# Patient Record
Sex: Female | Born: 1970 | Race: Black or African American | Hispanic: No | Marital: Married | Smoking: Never smoker
Health system: Southern US, Community
[De-identification: ages and names within clinical notes are randomized; demographics above are authoritative.]

## PROBLEM LIST (undated history)

## (undated) DIAGNOSIS — Z8719 Personal history of other diseases of the digestive system: Secondary | ICD-10-CM

## (undated) DIAGNOSIS — N840 Polyp of corpus uteri: Secondary | ICD-10-CM

## (undated) DIAGNOSIS — R9389 Abnormal findings on diagnostic imaging of other specified body structures: Secondary | ICD-10-CM

## (undated) DIAGNOSIS — I10 Essential (primary) hypertension: Secondary | ICD-10-CM

## (undated) DIAGNOSIS — N950 Postmenopausal bleeding: Secondary | ICD-10-CM

## (undated) DIAGNOSIS — Z8759 Personal history of other complications of pregnancy, childbirth and the puerperium: Secondary | ICD-10-CM

## (undated) HISTORY — PX: ECTOPIC PREGNANCY SURGERY: SHX613

---
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
## 2005-07-23 ENCOUNTER — Ambulatory Visit: Payer: Self-pay | Admitting: Family Medicine

## 2005-08-16 ENCOUNTER — Ambulatory Visit: Payer: Self-pay | Admitting: Family Medicine

## 2009-02-16 ENCOUNTER — Emergency Department (HOSPITAL_COMMUNITY): Admission: EM | Admit: 2009-02-16 | Discharge: 2009-02-16 | Payer: Self-pay | Admitting: Emergency Medicine

## 2009-06-13 ENCOUNTER — Emergency Department (HOSPITAL_COMMUNITY): Admission: EM | Admit: 2009-06-13 | Discharge: 2009-06-13 | Payer: Self-pay | Admitting: Emergency Medicine

## 2010-12-11 ENCOUNTER — Emergency Department (HOSPITAL_COMMUNITY)
Admission: EM | Admit: 2010-12-11 | Discharge: 2010-12-11 | Payer: Self-pay | Source: Home / Self Care | Admitting: Emergency Medicine

## 2011-02-26 ENCOUNTER — Inpatient Hospital Stay (INDEPENDENT_AMBULATORY_CARE_PROVIDER_SITE_OTHER)
Admission: RE | Admit: 2011-02-26 | Discharge: 2011-02-26 | Disposition: A | Discharge status: Home / Self Care | Payer: BC Managed Care – PPO | Source: Ambulatory Visit | Attending: Family Medicine | Admitting: Family Medicine

## 2011-02-26 ENCOUNTER — Ambulatory Visit (INDEPENDENT_AMBULATORY_CARE_PROVIDER_SITE_OTHER): Payer: BC Managed Care – PPO

## 2011-02-26 DIAGNOSIS — N760 Acute vaginitis: Secondary | ICD-10-CM

## 2011-02-26 DIAGNOSIS — K5900 Constipation, unspecified: Secondary | ICD-10-CM

## 2011-02-26 LAB — WET PREP, GENITAL: Yeast Wet Prep HPF POC: NONE SEEN

## 2011-02-26 LAB — POCT URINALYSIS DIP (DEVICE)
Glucose, UA: NEGATIVE mg/dL
Ketones, ur: NEGATIVE mg/dL
Protein, ur: NEGATIVE mg/dL
Specific Gravity, Urine: 1.02 (ref 1.005–1.030)

## 2011-02-26 LAB — POCT PREGNANCY, URINE: Preg Test, Ur: NEGATIVE

## 2011-04-20 ENCOUNTER — Emergency Department (HOSPITAL_COMMUNITY): Payer: BC Managed Care – PPO

## 2011-04-20 ENCOUNTER — Emergency Department (HOSPITAL_COMMUNITY)
Admission: EM | Admit: 2011-04-20 | Discharge: 2011-04-20 | Disposition: A | Discharge status: Home / Self Care | Payer: BC Managed Care – PPO | Attending: Emergency Medicine | Admitting: Emergency Medicine

## 2011-04-20 DIAGNOSIS — R209 Unspecified disturbances of skin sensation: Secondary | ICD-10-CM | POA: Insufficient documentation

## 2011-04-20 DIAGNOSIS — M545 Low back pain, unspecified: Secondary | ICD-10-CM | POA: Insufficient documentation

## 2011-04-20 DIAGNOSIS — X58XXXA Exposure to other specified factors, initial encounter: Secondary | ICD-10-CM | POA: Insufficient documentation

## 2011-04-20 DIAGNOSIS — T148XXA Other injury of unspecified body region, initial encounter: Secondary | ICD-10-CM | POA: Insufficient documentation

## 2011-04-20 DIAGNOSIS — M5430 Sciatica, unspecified side: Secondary | ICD-10-CM | POA: Insufficient documentation

## 2011-04-20 DIAGNOSIS — M25559 Pain in unspecified hip: Secondary | ICD-10-CM | POA: Insufficient documentation

## 2011-04-20 LAB — URINALYSIS, ROUTINE W REFLEX MICROSCOPIC
Bilirubin Urine: NEGATIVE
Glucose, UA: NEGATIVE mg/dL
Hgb urine dipstick: NEGATIVE
Ketones, ur: NEGATIVE mg/dL
pH: 7 (ref 5.0–8.0)

## 2011-10-25 ENCOUNTER — Emergency Department (HOSPITAL_COMMUNITY)
Admission: EM | Admit: 2011-10-25 | Discharge: 2011-10-25 | Disposition: A | Discharge status: Home / Self Care | Payer: BC Managed Care – PPO | Attending: Emergency Medicine | Admitting: Emergency Medicine

## 2011-10-25 ENCOUNTER — Encounter: Payer: Self-pay | Admitting: *Deleted

## 2011-10-25 DIAGNOSIS — B9789 Other viral agents as the cause of diseases classified elsewhere: Secondary | ICD-10-CM | POA: Insufficient documentation

## 2011-10-25 DIAGNOSIS — R51 Headache: Secondary | ICD-10-CM | POA: Insufficient documentation

## 2011-10-25 DIAGNOSIS — R071 Chest pain on breathing: Secondary | ICD-10-CM | POA: Insufficient documentation

## 2011-10-25 DIAGNOSIS — B349 Viral infection, unspecified: Secondary | ICD-10-CM

## 2011-10-25 DIAGNOSIS — M549 Dorsalgia, unspecified: Secondary | ICD-10-CM | POA: Insufficient documentation

## 2011-10-25 DIAGNOSIS — R5381 Other malaise: Secondary | ICD-10-CM | POA: Insufficient documentation

## 2011-10-25 DIAGNOSIS — R509 Fever, unspecified: Secondary | ICD-10-CM | POA: Insufficient documentation

## 2011-10-25 DIAGNOSIS — R05 Cough: Secondary | ICD-10-CM | POA: Insufficient documentation

## 2011-10-25 DIAGNOSIS — R5383 Other fatigue: Secondary | ICD-10-CM | POA: Insufficient documentation

## 2011-10-25 DIAGNOSIS — R059 Cough, unspecified: Secondary | ICD-10-CM | POA: Insufficient documentation

## 2011-10-25 DIAGNOSIS — R070 Pain in throat: Secondary | ICD-10-CM | POA: Insufficient documentation

## 2011-10-25 MED ORDER — ACETAMINOPHEN 325 MG PO TABS
650.0000 mg | ORAL_TABLET | Freq: Once | ORAL | Status: AC
  Administered 2011-10-25: 650 mg via ORAL
  Filled 2011-10-25: qty 2

## 2011-10-25 NOTE — ED Provider Notes (Signed)
History     CSN: [id number] Arrival date & time: 10/25/2011 10:26 AM   First MD Initiated Contact with Patient 10/25/11 1154      Chief Complaint  Patient presents with  . URI     Patient is a 40 y.o. female presenting with URI. The history is provided by the patient.  URI The primary symptoms include fever, fatigue, headaches, sore throat and cough. Primary symptoms do not include abdominal pain or vomiting. The current episode started yesterday. This is a new problem. The problem has been gradually worsening.  no focal weakness No abd pain She is ambulatory Chest wall and back hurt worse with cough No recent travel She is otherwise healthy   History reviewed. No pertinent past medical history.  History reviewed. No pertinent past surgical history.  History reviewed. No pertinent family history.  History  Substance Use Topics  . Smoking status: Never Smoker   . Smokeless tobacco: Not on file  . Alcohol Use: No    OB History    Grav Para Term Preterm Abortions TAB SAB Ect Mult Living                  Review of Systems  Constitutional: Positive for fever and fatigue.  HENT: Positive for sore throat.   Respiratory: Positive for cough.   Gastrointestinal: Negative for vomiting and abdominal pain.  Neurological: Positive for headaches.    Allergies  Review of patient's allergies indicates no known allergies.  Home Medications  No current outpatient prescriptions on file.  BP 138/72  Pulse 117  Temp(Src) 99.6 F (37.6 C) (Oral)  Resp 20  SpO2 97%  LMP 10/06/2011  Physical Exam CONSTITUTIONAL: Well developed/well nourished HEAD AND FACE: Normocephalic/atraumatic EYES: EOMI/PERRL ENMT: Mucous membranes moist, uvula midline, +exudates, +erythema NECK: supple no meningeal signs SPINE:entire spine nontender Chest - tender to palpation, no crepitus CV: S1/S2 noted, no murmurs/rubs/gallops noted LUNGS: Lungs are clear to auscultation bilaterally, no  apparent distress ABDOMEN: soft, nontender, no rebound or guarding NEURO: Pt is awake/alert, moves all extremitiesx4 Gait normal She is in no distress EXTREMITIES: pulses normal, full ROM SKIN: warm, color normal PSYCH: no abnormalities of mood noted    ED Course  Procedures    Labs Reviewed  RAPID STREP SCREEN     MDM  Nursing notes reviewed and considered in documentation All labs/vitals reviewed and considered  Pt is well appearing, likely URI Lung sounds clear        Joya Gaskins, MD 10/25/11 1236

## 2011-10-25 NOTE — ED Notes (Signed)
Reports having cold with productive cough, pain when coughing. Also having lower back pain. Denies urinary symptoms.

## 2013-07-05 ENCOUNTER — Emergency Department (HOSPITAL_COMMUNITY)
Admission: EM | Admit: 2013-07-05 | Discharge: 2013-07-06 | Disposition: A | Discharge status: Home / Self Care | Payer: BC Managed Care – PPO | Attending: Emergency Medicine | Admitting: Emergency Medicine

## 2013-07-05 ENCOUNTER — Encounter (HOSPITAL_COMMUNITY): Payer: Self-pay | Admitting: Cardiology

## 2013-07-05 DIAGNOSIS — Z791 Long term (current) use of non-steroidal anti-inflammatories (NSAID): Secondary | ICD-10-CM | POA: Insufficient documentation

## 2013-07-05 DIAGNOSIS — N390 Urinary tract infection, site not specified: Secondary | ICD-10-CM

## 2013-07-05 DIAGNOSIS — R109 Unspecified abdominal pain: Secondary | ICD-10-CM | POA: Insufficient documentation

## 2013-07-05 LAB — COMPREHENSIVE METABOLIC PANEL
Alkaline Phosphatase: 111 U/L (ref 39–117)
BUN: 12 mg/dL (ref 6–23)
GFR calc Af Amer: >90 mL/min (ref >90)
Glucose, Bld: 101 mg/dL — ABNORMAL HIGH (ref 70–99)
Potassium: 3.6 mEq/L (ref 3.5–5.1)
Total Protein: 7.7 g/dL (ref 6.0–8.3)

## 2013-07-05 LAB — CBC WITH DIFFERENTIAL/PLATELET
Eosinophils Absolute: 0.2 10*3/uL (ref 0.0–0.7)
Eosinophils Relative: 2 % (ref 0–5)
HCT: 37.1 % (ref 36.0–46.0)
Hemoglobin: 12.7 g/dL (ref 12.0–15.0)
Lymphs Abs: 3.1 10*3/uL (ref 0.7–4.0)
MCH: 28.9 pg (ref 26.0–34.0)
MCV: 84.3 fL (ref 78.0–100.0)
Monocytes Relative: 9 % (ref 3–12)
Neutrophils Relative %: 45 % (ref 43–77)
RBC: 4.4 MIL/uL (ref 3.87–5.11)

## 2013-07-05 NOTE — ED Notes (Signed)
Pt reports that she started having lower abd pain that started this morning. States that she missed her period last month but is currently on it now. Denies any urinary symptoms. No n/v.

## 2013-07-06 ENCOUNTER — Emergency Department (HOSPITAL_COMMUNITY): Payer: BC Managed Care – PPO

## 2013-07-06 ENCOUNTER — Encounter (HOSPITAL_COMMUNITY): Payer: Self-pay | Admitting: Radiology

## 2013-07-06 LAB — URINALYSIS, ROUTINE W REFLEX MICROSCOPIC
Ketones, ur: 15 mg/dL — AB
Nitrite: POSITIVE — AB
Protein, ur: 100 mg/dL — AB
Urobilinogen, UA: 1 mg/dL (ref 0.0–1.0)
pH: 5 (ref 5.0–8.0)

## 2013-07-06 LAB — URINE MICROSCOPIC-ADD ON

## 2013-07-06 MED ORDER — NITROFURANTOIN MONOHYD MACRO 100 MG PO CAPS: 100.0000 mg | ORAL_CAPSULE | Freq: Two times a day (BID) | ORAL | Status: DC

## 2013-07-06 MED ORDER — PHENAZOPYRIDINE HCL 200 MG PO TABS: 200.0000 mg | ORAL_TABLET | Freq: Three times a day (TID) | ORAL | Status: DC

## 2013-07-06 MED ORDER — PHENAZOPYRIDINE HCL 100 MG PO TABS
200.0000 mg | ORAL_TABLET | Freq: Once | ORAL | Status: AC
  Administered 2013-07-06: 200 mg via ORAL
  Filled 2013-07-06: qty 2

## 2013-07-06 MED ORDER — IBUPROFEN 600 MG PO TABS: 600.0000 mg | ORAL_TABLET | Freq: Four times a day (QID) | ORAL | Status: DC | PRN

## 2013-07-06 MED ORDER — SODIUM CHLORIDE 0.9 % IV BOLUS (SEPSIS)
1000.0000 mL | Freq: Once | INTRAVENOUS | Status: AC
  Administered 2013-07-06: 1000 mL via INTRAVENOUS

## 2013-07-06 MED ORDER — DEXTROSE 5 % IV SOLN
1.0000 g | Freq: Once | INTRAVENOUS | Status: AC
  Administered 2013-07-06: 1 g via INTRAVENOUS
  Filled 2013-07-06: qty 10

## 2013-07-06 MED ORDER — HYDROMORPHONE HCL PF 1 MG/ML IJ SOLN
1.0000 mg | INTRAMUSCULAR | Status: AC
Start: 2013-07-06 — End: 2013-07-06
  Administered 2013-07-06: 1 mg via INTRAVENOUS
  Filled 2013-07-06: qty 1

## 2013-07-06 MED ORDER — NITROFURANTOIN MONOHYD MACRO 100 MG PO CAPS
100.0000 mg | ORAL_CAPSULE | Freq: Once | ORAL | Status: AC
  Administered 2013-07-06: 100 mg via ORAL
  Filled 2013-07-06: qty 1

## 2013-07-06 MED ORDER — ONDANSETRON HCL 4 MG/2ML IJ SOLN
4.0000 mg | Freq: Once | INTRAMUSCULAR | Status: AC
  Administered 2013-07-06: 4 mg via INTRAVENOUS
  Filled 2013-07-06: qty 2

## 2013-07-06 NOTE — ED Notes (Signed)
Patient in route to Radiology at this time.

## 2013-07-06 NOTE — ED Provider Notes (Signed)
CSN: [id number]     Arrival date & time 07/05/13  1813 History     First MD Initiated Contact with Patient 07/05/13 2354     Chief Complaint  Patient presents with  . Abdominal Pain   HPI Margaret Singleton is a 42 y.o. female presenting with lower abdominal pain. No history of kidney stones. Patient denies she could be pregnant. Denies urinary symptoms.  No vomiting, no diarrhea, fevers or chills. Pain is severe, sharp, lower abdomen, nonradiating.   History reviewed. No pertinent past medical history. History reviewed. No pertinent past surgical history. History reviewed. No pertinent family history. History  Substance Use Topics  . Smoking status: Never Smoker   . Smokeless tobacco: Not on file  . Alcohol Use: No   OB History   Grav Para Term Preterm Abortions TAB SAB Ect Mult Living                 Review of Systems At least 10pt or greater review of systems completed and are negative except where specified in the HPI.  Allergies  Review of patient's allergies indicates no known allergies.  Home Medications   Current Outpatient Rx  Name  Route  Sig  Dispense  Refill  . ibuprofen (ADVIL,MOTRIN) 200 MG tablet   Oral   Take 400 mg by mouth every 6 (six) hours as needed for pain.          BP 128/82  Pulse 57  Temp(Src) 97.7 F (36.5 C) (Oral)  Resp 16  SpO2 98%  LMP 07/06/2013 Physical Exam  Nursing notes reviewed.  Electronic medical record reviewed. VITAL SIGNS:   Filed Vitals:   07/06/13 0245 07/06/13 0330 07/06/13 0415 07/06/13 0602  BP:    111/71  Pulse: 73 59 57 72  Temp:      TempSrc:      Resp:    18  SpO2: 94% 97% 98% 100%   CONSTITUTIONAL: Awake, oriented, appears non-toxic HENT: Atraumatic, normocephalic, oral mucosa pink and moist, airway patent. Nares patent without drainage. External ears normal. EYES: Conjunctiva clear, EOMI, PERRLA NECK: Trachea midline, non-tender, supple CARDIOVASCULAR: Normal heart rate, Normal rhythm, No murmurs,  rubs, gallops PULMONARY/CHEST: Clear to auscultation, no rhonchi, wheezes, or rales. Symmetrical breath sounds. Non-tender. ABDOMINAL: Non-distended, soft, mild tenderness in the suprapubic region no rebound or guarding.  BS normal. NEUROLOGIC: Non-focal, moving all four extremities, no gross sensory or motor deficits. EXTREMITIES: No clubbing, cyanosis, or edema SKIN: Warm, Dry, No erythema, No rash  ED Course   Procedures (including critical care time)  Labs Reviewed  COMPREHENSIVE METABOLIC PANEL - Abnormal; Notable for the following:    Glucose, Bld 101 (*)    Albumin 3.4 (*)    All other components within normal limits  URINALYSIS, ROUTINE W REFLEX MICROSCOPIC - Abnormal; Notable for the following:    Color, Urine RED (*)    APPearance TURBID (*)    Hgb urine dipstick LARGE (*)    Bilirubin Urine LARGE (*)    Ketones, ur 15 (*)    Protein, ur 100 (*)    Nitrite POSITIVE (*)    Leukocytes, UA LARGE (*)    All other components within normal limits  CBC WITH DIFFERENTIAL  LIPASE, BLOOD  URINE MICROSCOPIC-ADD ON  POCT PREGNANCY, URINE   Ct Abdomen Pelvis Wo Contrast  07/06/2013   *RADIOLOGY REPORT*  Clinical Data: Abdominal pain, left lower quadrant greater than right lower quadrant  CT ABDOMEN AND PELVIS WITHOUT CONTRAST  Technique:  Multidetector CT imaging of the abdomen and pelvis was performed following the standard protocol without intravenous contrast.  Comparison: None available  Findings: Subsegmental atelectasis is present dependently within both lung bases.  Limited noncontrast evaluation the liver is unremarkable. Multiple calcified gallstones are present within the gallbladder lumen without evidence of cholecystitis.  The spleen, adrenal glands, and pancreas demonstrate normal unenhanced appearance.  The kidneys are of symmetric size without evidence of nephrolithiasis or hydronephrosis.  No stones seen along the course of either ureter  There is no evidence of bowel  obstruction.  The appendix is well visualized within the right lower quadrant.  Appendicolith is present within the appendiceal lumen without evidence of acute appendicitis (series 8048, image 51).  The appendix measures 7 mm in periappendiceal fat stranding. Abnormal wall thickening is seen about the bowels to suggest underlying inflammation.  The pelvic viscera are within normal limits.  No free air or fluid is seen.  No pathologically enlarged intra-abdominal pelvic lymph nodes are identified.  No acute osseous abnormality  IMPRESSION:  1.  No CT evidence of acute intra-abdominal pelvic pathology. 2.  Appendicolith within the appendiceal lumen without CT evidence of acute appendicitis. 3.  No CT evidence of nephrolithiasis or obstructive uropathy. 4.  Cholelithiasis.   Original Report Authenticated By: Rise Mu, M.D.   1. UTI (lower urinary tract infection)     MDM  Patient presentation consistent for appendicitis, patient does have appendicolith, no acute appendicitis. White count is unremarkable, patient afebrile, nontoxic, abdomen exam is benign.  Urine suggestive of UTI. We'll treat patient for UTI, appendicitis precautions given. No other intra-abdominal emergency evidence at this time.  Jones Skene, MD 07/08/13 [phone]

## 2013-07-06 NOTE — ED Notes (Signed)
Pt has returned from Radiology.  

## 2014-02-19 ENCOUNTER — Emergency Department (HOSPITAL_COMMUNITY)
Admission: EM | Admit: 2014-02-19 | Discharge: 2014-02-19 | Disposition: A | Discharge status: Nursing Home (Includes ICF) | Payer: BC Managed Care – PPO | Source: Home / Self Care | Attending: Family Medicine | Admitting: Family Medicine

## 2014-02-19 ENCOUNTER — Observation Stay (HOSPITAL_COMMUNITY)
Admission: EM | Admit: 2014-02-19 | Discharge: 2014-02-21 | Disposition: A | Discharge status: Home / Self Care | Payer: BC Managed Care – PPO | Attending: General Surgery | Admitting: General Surgery

## 2014-02-19 ENCOUNTER — Encounter (HOSPITAL_COMMUNITY): Payer: Self-pay | Admitting: Emergency Medicine

## 2014-02-19 DIAGNOSIS — R109 Unspecified abdominal pain: Secondary | ICD-10-CM

## 2014-02-19 DIAGNOSIS — K8020 Calculus of gallbladder without cholecystitis without obstruction: Secondary | ICD-10-CM | POA: Insufficient documentation

## 2014-02-19 DIAGNOSIS — K3580 Unspecified acute appendicitis: Principal | ICD-10-CM | POA: Insufficient documentation

## 2014-02-19 LAB — COMPREHENSIVE METABOLIC PANEL
ALBUMIN: 3.5 g/dL (ref 3.5–5.2)
ALK PHOS: 90 U/L (ref 39–117)
ALT: 21 U/L (ref 0–35)
AST: 21 U/L (ref 0–37)
BILIRUBIN TOTAL: 0.6 mg/dL (ref 0.3–1.2)
BUN: 8 mg/dL (ref 6–23)
CHLORIDE: 104 meq/L (ref 96–112)
CO2: 26 mEq/L (ref 19–32)
Calcium: 8.9 mg/dL (ref 8.4–10.5)
Creatinine, Ser: 0.68 mg/dL (ref 0.50–1.10)
GFR calc Af Amer: >90 mL/min (ref >90)
GFR calc non Af Amer: >90 mL/min (ref >90)
Glucose, Bld: 86 mg/dL (ref 70–99)
POTASSIUM: 3.7 meq/L (ref 3.7–5.3)
SODIUM: 141 meq/L (ref 137–147)
Total Protein: 7.9 g/dL (ref 6.0–8.3)

## 2014-02-19 LAB — URINALYSIS, ROUTINE W REFLEX MICROSCOPIC
Bilirubin Urine: NEGATIVE
GLUCOSE, UA: NEGATIVE mg/dL
HGB URINE DIPSTICK: NEGATIVE
Ketones, ur: NEGATIVE mg/dL
Leukocytes, UA: NEGATIVE
Nitrite: NEGATIVE
PROTEIN: NEGATIVE mg/dL
Specific Gravity, Urine: 1.012 (ref 1.005–1.030)
Urobilinogen, UA: 0.2 mg/dL (ref 0.0–1.0)
pH: 8 (ref 5.0–8.0)

## 2014-02-19 LAB — CBC WITH DIFFERENTIAL/PLATELET
BASOS ABS: 0 10*3/uL (ref 0.0–0.1)
BASOS PCT: 0 % (ref 0–1)
Eosinophils Absolute: 0.2 10*3/uL (ref 0.0–0.7)
Eosinophils Relative: 2 % (ref 0–5)
HCT: 36.8 % (ref 36.0–46.0)
Hemoglobin: 12.4 g/dL (ref 12.0–15.0)
Lymphocytes Relative: 28 % (ref 12–46)
Lymphs Abs: 2.4 10*3/uL (ref 0.7–4.0)
MCH: 28.8 pg (ref 26.0–34.0)
MCHC: 33.7 g/dL (ref 30.0–36.0)
MCV: 85.4 fL (ref 78.0–100.0)
Monocytes Absolute: 0.5 10*3/uL (ref 0.1–1.0)
Monocytes Relative: 6 % (ref 3–12)
NEUTROS ABS: 5.5 10*3/uL (ref 1.7–7.7)
NEUTROS PCT: 65 % (ref 43–77)
Platelets: 214 10*3/uL (ref 150–400)
RBC: 4.31 MIL/uL (ref 3.87–5.11)
RDW: 13.9 % (ref 11.5–15.5)
WBC: 8.6 10*3/uL (ref 4.0–10.5)

## 2014-02-19 LAB — POCT URINALYSIS DIP (DEVICE)
BILIRUBIN URINE: NEGATIVE
Glucose, UA: NEGATIVE mg/dL
Ketones, ur: NEGATIVE mg/dL
LEUKOCYTES UA: NEGATIVE
NITRITE: NEGATIVE
PH: 7 (ref 5.0–8.0)
PROTEIN: NEGATIVE mg/dL
Specific Gravity, Urine: 1.02 (ref 1.005–1.030)
Urobilinogen, UA: 0.2 mg/dL (ref 0.0–1.0)

## 2014-02-19 LAB — LIPASE, BLOOD: LIPASE: 12 U/L (ref 11–59)

## 2014-02-19 LAB — POC URINE PREG, ED: Preg Test, Ur: NEGATIVE

## 2014-02-19 MED ORDER — SODIUM CHLORIDE 0.9 % IV SOLN
Freq: Once | INTRAVENOUS | Status: AC
  Administered 2014-02-19: 20:00:00 via INTRAVENOUS

## 2014-02-19 MED ORDER — IOHEXOL 300 MG/ML  SOLN: 25.0000 mL | INTRAMUSCULAR | Status: AC

## 2014-02-19 MED ORDER — MORPHINE SULFATE 4 MG/ML IJ SOLN
6.0000 mg | Freq: Once | INTRAMUSCULAR | Status: AC
  Administered 2014-02-19: 6 mg via INTRAVENOUS
  Filled 2014-02-19: qty 2

## 2014-02-19 NOTE — ED Notes (Signed)
[facility] by [facility] CMA

## 2014-02-19 NOTE — ED Provider Notes (Signed)
CSN: [id number]     Arrival date & time 02/19/14  1750 History   First MD Initiated Contact with Patient 02/19/14 1846     Chief Complaint  Patient presents with  . Abdominal Pain   (Consider location/radiation/quality/duration/timing/severity/associated sxs/prior Treatment) Patient is a 43 y.o. female presenting with abdominal pain.  Abdominal Pain Pain location:  RUQ, LUQ and epigastric Pain quality: cramping and sharp   Pain radiates to:  L flank and R flank Pain severity:  Moderate Onset quality:  Sudden Duration:  6 hours Progression:  Worsening Chronicity:  New Context: not recent illness   Context comment:  No prior h/o abd pain, recent nl gyn exam, having chills, no uti sx., felt fine this am. Associated symptoms: chills   Associated symptoms: no chest pain, no cough, no diarrhea, no dysuria, no fever, no nausea, no vaginal bleeding and no vomiting     History reviewed. No pertinent past medical history. History reviewed. No pertinent past surgical history. No family history on file. History  Substance Use Topics  . Smoking status: Never Smoker   . Smokeless tobacco: Not on file  . Alcohol Use: No   OB History   Grav Para Term Preterm Abortions TAB SAB Ect Mult Living                 Review of Systems  Constitutional: Positive for chills. Negative for fever.  Respiratory: Negative for cough.   Cardiovascular: Negative for chest pain.  Gastrointestinal: Positive for abdominal pain. Negative for nausea, vomiting, diarrhea and blood in stool.  Genitourinary: Negative for dysuria, urgency, frequency, vaginal bleeding, menstrual problem and pelvic pain.    Allergies  Review of patient's allergies indicates no known allergies.  Home Medications   Current Outpatient Rx  Name  Route  Sig  Dispense  Refill  . ibuprofen (ADVIL,MOTRIN) 200 MG tablet   Oral   Take 400 mg by mouth every 6 (six) hours as needed for pain.         Marland Kitchen ibuprofen (ADVIL,MOTRIN) 600 MG  tablet   Oral   Take 1 tablet (600 mg total) by mouth every 6 (six) hours as needed for pain.   30 tablet   0   . nitrofurantoin, macrocrystal-monohydrate, (MACROBID) 100 MG capsule   Oral   Take 1 capsule (100 mg total) by mouth 2 (two) times daily.   10 capsule   0   . phenazopyridine (PYRIDIUM) 200 MG tablet   Oral   Take 1 tablet (200 mg total) by mouth 3 (three) times daily.   6 tablet   0    BP 140/68  Pulse 81  Temp(Src) 98.2 F (36.8 C) (Oral)  Resp 20  SpO2 100%  LMP 02/05/2014 Physical Exam  Nursing note and vitals reviewed. Constitutional: She is oriented to person, place, and time. She appears well-developed and well-nourished. She appears distressed.  Eyes: Pupils are equal, round, and reactive to light.  Neck: Normal range of motion. Neck supple.  Cardiovascular: Normal heart sounds.   Pulmonary/Chest: Breath sounds normal.  Abdominal: Soft. She exhibits no distension and no mass. Bowel sounds are absent. There is no hepatosplenomegaly. There is tenderness in the right upper quadrant, epigastric area, periumbilical area and left upper quadrant. There is guarding. There is no rigidity, no rebound, no CVA tenderness, no tenderness at McBurney's point and negative Murphy's sign.  Lymphadenopathy:    She has no cervical adenopathy.  Neurological: She is alert and oriented to person, place, and  time.  Skin: Skin is warm and dry. No rash noted.    ED Course  Procedures (including critical care time) Labs Review Labs Reviewed  POCT URINALYSIS DIP (DEVICE) - Abnormal; Notable for the following:    Hgb urine dipstick TRACE (*)    All other components within normal limits   Imaging Review No results found.   MDM  No diagnosis found. Sent for unexplained acute upper abd pain, into flanks with chills, u/a neg, no lower abd pain.no n/v/d.    Linna HoffJames D Olanrewaju Osborn, MD 02/19/14 [phone]

## 2014-02-19 NOTE — ED Notes (Signed)
Pt ambulatory to bathroom at this time.  Family at bedside.

## 2014-02-19 NOTE — ED Notes (Addendum)
Pt arrives from urgent care, c/o right upper abd pain from epigastric area to navel and across the right side x7 hrs. Urgent care wants to rule out appendicitis. 22 LH. VSS. 140/80  HR 90 99% RA. Denies N/V/D.

## 2014-02-19 NOTE — ED Notes (Signed)
[facility] did not have a truck avail... [facility]

## 2014-02-19 NOTE — ED Notes (Signed)
Patient complains of abdominal pain that started today; denies vomiting and nausea; rebound tenderness in lower right quadrant.

## 2014-02-19 NOTE — ED Notes (Signed)
Pt ambulatory to bathroom without any problems.  St's pain in abd started earlier today.  Continues to have pain, denies any nausea, vomiting or diarrhea

## 2014-02-20 ENCOUNTER — Emergency Department (HOSPITAL_COMMUNITY): Payer: BC Managed Care – PPO

## 2014-02-20 ENCOUNTER — Encounter (HOSPITAL_COMMUNITY)
Admission: EM | Disposition: A | Discharge status: Home / Self Care | Payer: Self-pay | Source: Home / Self Care | Attending: Emergency Medicine

## 2014-02-20 ENCOUNTER — Encounter (HOSPITAL_COMMUNITY): Payer: BC Managed Care – PPO | Admitting: Anesthesiology

## 2014-02-20 ENCOUNTER — Observation Stay (HOSPITAL_COMMUNITY): Payer: BC Managed Care – PPO | Admitting: Anesthesiology

## 2014-02-20 ENCOUNTER — Encounter (HOSPITAL_COMMUNITY): Payer: Self-pay | Admitting: Radiology

## 2014-02-20 DIAGNOSIS — K3580 Unspecified acute appendicitis: Secondary | ICD-10-CM

## 2014-02-20 HISTORY — PX: LAPAROSCOPIC APPENDECTOMY: SHX408

## 2014-02-20 HISTORY — DX: Unspecified acute appendicitis: K35.80

## 2014-02-20 LAB — SURGICAL PCR SCREEN
MRSA, PCR: NEGATIVE
Staphylococcus aureus: NEGATIVE

## 2014-02-20 SURGERY — APPENDECTOMY, LAPAROSCOPIC
Anesthesia: General | Site: Abdomen

## 2014-02-20 SURGERY — Surgical Case
Anesthesia: *Unknown

## 2014-02-20 MED ORDER — ONDANSETRON HCL 4 MG/2ML IJ SOLN: 4.0000 mg | Freq: Four times a day (QID) | INTRAMUSCULAR | Status: DC | PRN

## 2014-02-20 MED ORDER — GLYCOPYRROLATE 0.2 MG/ML IJ SOLN
INTRAMUSCULAR | Status: AC
  Filled 2014-02-20: qty 4

## 2014-02-20 MED ORDER — FENTANYL CITRATE 0.05 MG/ML IJ SOLN
25.0000 ug | INTRAMUSCULAR | Status: DC | PRN
  Administered 2014-02-20 (×2): 25 ug via INTRAVENOUS

## 2014-02-20 MED ORDER — LACTATED RINGERS IV SOLN
INTRAVENOUS | Status: DC | PRN
  Administered 2014-02-20 (×2): via INTRAVENOUS

## 2014-02-20 MED ORDER — FENTANYL CITRATE 0.05 MG/ML IJ SOLN
INTRAMUSCULAR | Status: DC | PRN
  Administered 2014-02-20: 150 ug via INTRAVENOUS

## 2014-02-20 MED ORDER — LIDOCAINE HCL (CARDIAC) 20 MG/ML IV SOLN
INTRAVENOUS | Status: DC | PRN
  Administered 2014-02-20: 80 mg via INTRAVENOUS

## 2014-02-20 MED ORDER — ACETAMINOPHEN 160 MG/5ML PO SOLN
325.0000 mg | ORAL | Status: DC | PRN
  Filled 2014-02-20: qty 20.3

## 2014-02-20 MED ORDER — PROPOFOL 10 MG/ML IV BOLUS
INTRAVENOUS | Status: AC
  Filled 2014-02-20: qty 20

## 2014-02-20 MED ORDER — HEPARIN SODIUM (PORCINE) 5000 UNIT/ML IJ SOLN
5000.0000 [IU] | Freq: Three times a day (TID) | INTRAMUSCULAR | Status: DC
  Filled 2014-02-20 (×5): qty 1

## 2014-02-20 MED ORDER — ONDANSETRON HCL 4 MG/2ML IJ SOLN
INTRAMUSCULAR | Status: AC
Start: 2014-02-20 — End: 2014-02-20
  Filled 2014-02-20: qty 2

## 2014-02-20 MED ORDER — MIDAZOLAM HCL 5 MG/5ML IJ SOLN
INTRAMUSCULAR | Status: DC | PRN
  Administered 2014-02-20: 2 mg via INTRAVENOUS

## 2014-02-20 MED ORDER — ROCURONIUM BROMIDE 50 MG/5ML IV SOLN
INTRAVENOUS | Status: AC
  Filled 2014-02-20: qty 1

## 2014-02-20 MED ORDER — PHENYLEPHRINE 40 MCG/ML (10ML) SYRINGE FOR IV PUSH (FOR BLOOD PRESSURE SUPPORT)
PREFILLED_SYRINGE | INTRAVENOUS | Status: AC
  Filled 2014-02-20: qty 10

## 2014-02-20 MED ORDER — DEXAMETHASONE SODIUM PHOSPHATE 4 MG/ML IJ SOLN
INTRAMUSCULAR | Status: AC
  Filled 2014-02-20: qty 1

## 2014-02-20 MED ORDER — OXYCODONE HCL 5 MG PO TABS
5.0000 mg | ORAL_TABLET | ORAL | Status: DC | PRN
  Administered 2014-02-20: 10 mg via ORAL
  Filled 2014-02-20: qty 2

## 2014-02-20 MED ORDER — IOHEXOL 300 MG/ML  SOLN
80.0000 mL | Freq: Once | INTRAMUSCULAR | Status: AC | PRN
  Administered 2014-02-20: 80 mL via INTRAVENOUS

## 2014-02-20 MED ORDER — ONDANSETRON HCL 4 MG/2ML IJ SOLN: 4.0000 mg | Freq: Once | INTRAMUSCULAR | Status: DC | PRN

## 2014-02-20 MED ORDER — ACETAMINOPHEN 325 MG PO TABS: 325.0000 mg | ORAL_TABLET | ORAL | Status: DC | PRN

## 2014-02-20 MED ORDER — FENTANYL CITRATE 0.05 MG/ML IJ SOLN
INTRAMUSCULAR | Status: AC
  Filled 2014-02-20: qty 5

## 2014-02-20 MED ORDER — PANTOPRAZOLE SODIUM 40 MG IV SOLR
40.0000 mg | Freq: Every day | INTRAVENOUS | Status: DC
  Administered 2014-02-20: 40 mg via INTRAVENOUS
  Filled 2014-02-20 (×2): qty 40

## 2014-02-20 MED ORDER — 0.9 % SODIUM CHLORIDE (POUR BTL) OPTIME
TOPICAL | Status: DC | PRN
  Administered 2014-02-20: 1000 mL

## 2014-02-20 MED ORDER — SODIUM CHLORIDE 0.9 % IR SOLN
Status: DC | PRN
  Administered 2014-02-20: 1000 mL

## 2014-02-20 MED ORDER — BUPIVACAINE-EPINEPHRINE 0.25% -1:200000 IJ SOLN
INTRAMUSCULAR | Status: DC | PRN
  Administered 2014-02-20: 15 mL

## 2014-02-20 MED ORDER — SODIUM CHLORIDE 0.9 % IV SOLN
INTRAVENOUS | Status: DC
  Administered 2014-02-20: 16:00:00 via INTRAVENOUS
  Administered 2014-02-21: 100 mL/h via INTRAVENOUS

## 2014-02-20 MED ORDER — PROPOFOL 10 MG/ML IV BOLUS
INTRAVENOUS | Status: DC | PRN
  Administered 2014-02-20: 140 mg via INTRAVENOUS
  Administered 2014-02-20: 30 mg via INTRAVENOUS

## 2014-02-20 MED ORDER — PIPERACILLIN-TAZOBACTAM 3.375 G IVPB
3.3750 g | Freq: Three times a day (TID) | INTRAVENOUS | Status: DC
  Administered 2014-02-20 – 2014-02-21 (×4): 3.375 g via INTRAVENOUS
  Filled 2014-02-20 (×7): qty 50

## 2014-02-20 MED ORDER — BUPIVACAINE-EPINEPHRINE (PF) 0.25% -1:200000 IJ SOLN
INTRAMUSCULAR | Status: AC
  Filled 2014-02-20: qty 30

## 2014-02-20 MED ORDER — OXYCODONE HCL 5 MG PO TABS: 5.0000 mg | ORAL_TABLET | Freq: Once | ORAL | Status: DC | PRN

## 2014-02-20 MED ORDER — FENTANYL CITRATE 0.05 MG/ML IJ SOLN
INTRAMUSCULAR | Status: AC
  Administered 2014-02-20: 25 ug via INTRAVENOUS
  Filled 2014-02-20: qty 2

## 2014-02-20 MED ORDER — LACTATED RINGERS IV SOLN: INTRAVENOUS | Status: DC

## 2014-02-20 MED ORDER — LIDOCAINE HCL (CARDIAC) 20 MG/ML IV SOLN
INTRAVENOUS | Status: AC
  Filled 2014-02-20: qty 5

## 2014-02-20 MED ORDER — OXYCODONE HCL 5 MG/5ML PO SOLN: 5.0000 mg | Freq: Once | ORAL | Status: DC | PRN

## 2014-02-20 MED ORDER — ROCURONIUM BROMIDE 100 MG/10ML IV SOLN
INTRAVENOUS | Status: DC | PRN
  Administered 2014-02-20: 40 mg via INTRAVENOUS

## 2014-02-20 MED ORDER — MIDAZOLAM HCL 2 MG/2ML IJ SOLN
INTRAMUSCULAR | Status: AC
  Filled 2014-02-20: qty 2

## 2014-02-20 MED ORDER — HYDROMORPHONE HCL PF 1 MG/ML IJ SOLN
1.0000 mg | INTRAMUSCULAR | Status: DC | PRN
  Administered 2014-02-21: 1 mg via INTRAVENOUS
  Filled 2014-02-20: qty 1

## 2014-02-20 MED ORDER — PHENYLEPHRINE HCL 10 MG/ML IJ SOLN
INTRAMUSCULAR | Status: DC | PRN
  Administered 2014-02-20 (×2): 80 ug via INTRAVENOUS
  Administered 2014-02-20: 120 ug via INTRAVENOUS
  Administered 2014-02-20: 80 ug via INTRAVENOUS

## 2014-02-20 MED ORDER — NEOSTIGMINE METHYLSULFATE 1 MG/ML IJ SOLN
INTRAMUSCULAR | Status: AC
  Filled 2014-02-20: qty 10

## 2014-02-20 SURGICAL SUPPLY — 55 items
APPLIER CLIP ROT 10 11.4 M/L (STAPLE)
BLADE SURG ROTATE 9660 (MISCELLANEOUS) IMPLANT
CANISTER SUCTION 2500CC (MISCELLANEOUS) ×3 IMPLANT
CHLORAPREP W/TINT 26ML (MISCELLANEOUS) ×3 IMPLANT
CLIP APPLIE ROT 10 11.4 M/L (STAPLE) IMPLANT
COVER SURGICAL LIGHT HANDLE (MISCELLANEOUS) ×3 IMPLANT
CUTTER LINEAR ENDO 35 ETS (STAPLE) IMPLANT
CUTTER LINEAR ENDO 35 ETS TH (STAPLE) ×3 IMPLANT
DECANTER SPIKE VIAL GLASS SM (MISCELLANEOUS) ×3 IMPLANT
DERMABOND ADVANCED (GAUZE/BANDAGES/DRESSINGS) ×2
DERMABOND ADVANCED .7 DNX12 (GAUZE/BANDAGES/DRESSINGS) ×1 IMPLANT
DRAPE UTILITY 15X26 W/TAPE STR (DRAPE) ×6 IMPLANT
ELECT REM PT RETURN 9FT ADLT (ELECTROSURGICAL) ×3
ELECTRODE REM PT RTRN 9FT ADLT (ELECTROSURGICAL) ×1 IMPLANT
ENDOLOOP SUT PDS II  0 18 (SUTURE)
ENDOLOOP SUT PDS II 0 18 (SUTURE) IMPLANT
GLOVE BIO SURGEON STRL SZ8 (GLOVE) ×3 IMPLANT
GLOVE BIOGEL PI IND STRL 6.5 (GLOVE) ×1 IMPLANT
GLOVE BIOGEL PI IND STRL 7.0 (GLOVE) ×3 IMPLANT
GLOVE BIOGEL PI IND STRL 7.5 (GLOVE) ×1 IMPLANT
GLOVE BIOGEL PI IND STRL 8 (GLOVE) ×1 IMPLANT
GLOVE BIOGEL PI INDICATOR 6.5 (GLOVE) ×2
GLOVE BIOGEL PI INDICATOR 7.0 (GLOVE) ×6
GLOVE BIOGEL PI INDICATOR 7.5 (GLOVE) ×2
GLOVE BIOGEL PI INDICATOR 8 (GLOVE) ×2
GLOVE SURG SS PI 7.0 STRL IVOR (GLOVE) ×6 IMPLANT
GOWN STRL REUS W/ TWL LRG LVL3 (GOWN DISPOSABLE) ×2 IMPLANT
GOWN STRL REUS W/ TWL XL LVL3 (GOWN DISPOSABLE) ×1 IMPLANT
GOWN STRL REUS W/TWL LRG LVL3 (GOWN DISPOSABLE) ×4
GOWN STRL REUS W/TWL XL LVL3 (GOWN DISPOSABLE) ×2
KIT BASIN OR (CUSTOM PROCEDURE TRAY) ×3 IMPLANT
KIT ROOM TURNOVER OR (KITS) ×3 IMPLANT
NEEDLE 22X1 1/2 (OR ONLY) (NEEDLE) ×3 IMPLANT
NS IRRIG 1000ML POUR BTL (IV SOLUTION) ×3 IMPLANT
PAD ARMBOARD 7.5X6 YLW CONV (MISCELLANEOUS) ×6 IMPLANT
POUCH RETRIEVAL ECOSAC 10 (ENDOMECHANICALS) ×1 IMPLANT
POUCH RETRIEVAL ECOSAC 10MM (ENDOMECHANICALS) ×2
POUCH SPECIMEN RETRIEVAL 10MM (ENDOMECHANICALS) IMPLANT
RELOAD /EVU35 (ENDOMECHANICALS) IMPLANT
RELOAD CUTTER ETS 35MM STAND (ENDOMECHANICALS) IMPLANT
SCALPEL HARMONIC ACE (MISCELLANEOUS) ×3 IMPLANT
SCISSORS LAP 5X35 DISP (ENDOMECHANICALS) ×3 IMPLANT
SET IRRIG TUBING LAPAROSCOPIC (IRRIGATION / IRRIGATOR) ×3 IMPLANT
SPECIMEN JAR SMALL (MISCELLANEOUS) ×3 IMPLANT
SUT VIC AB 4-0 PS2 27 (SUTURE) ×3 IMPLANT
SWAB COLLECTION DEVICE MRSA (MISCELLANEOUS) IMPLANT
TOWEL OR 17X24 6PK STRL BLUE (TOWEL DISPOSABLE) ×3 IMPLANT
TOWEL OR 17X26 10 PK STRL BLUE (TOWEL DISPOSABLE) ×3 IMPLANT
TRAY FOLEY CATH 16FR SILVER (SET/KITS/TRAYS/PACK) ×3 IMPLANT
TRAY LAPAROSCOPIC (CUSTOM PROCEDURE TRAY) ×3 IMPLANT
TROCAR XCEL 12X100 BLDLESS (ENDOMECHANICALS) ×3 IMPLANT
TROCAR XCEL BLUNT TIP 100MML (ENDOMECHANICALS) ×3 IMPLANT
TROCAR XCEL NON-BLD 5MMX100MML (ENDOMECHANICALS) ×3 IMPLANT
TUBE ANAEROBIC SPECIMEN COL (MISCELLANEOUS) IMPLANT
WATER STERILE IRR 1000ML POUR (IV SOLUTION) IMPLANT

## 2014-02-20 NOTE — Progress Notes (Signed)
New Admission Note:  Arrival Method: via bed with nurse tech Mental Orientation: Alert and oriented x4 Telemetry:N/A Assessment: Completed Safety Measures: Safety Fall Prevention Plan was given, discussed and signed. [address] [place] Orientation: Patient has been orientated to the room, unit and the staff. Family: Family member at bedside Orders have been reviewed and implemented. Call light has been placed within reach.Will continue to monitor the patient.   Tempie DonningMercy Isaac Dubie BSN, RN  Phone Number: [phone] [facility] [place] [facility]

## 2014-02-20 NOTE — Anesthesia Preprocedure Evaluation (Addendum)
Anesthesia Evaluation  Patient identified by MRN, date of birth, ID band Patient awake    Reviewed: Allergy & Precautions, H&P , NPO status , Patient's Chart, lab work & pertinent test results  History of Anesthesia Complications Negative for: history of anesthetic complications  Airway Mallampati: II TM Distance: >3 FB Neck ROM: full    Dental  (+) Teeth Intact, Dental Advidsory Given   Pulmonary neg pulmonary ROS,  breath sounds clear to auscultation        Cardiovascular negative cardio ROS  Rhythm:Regular Rate:Normal     Neuro/Psych negative neurological ROS     GI/Hepatic Neg liver ROS, Appendicitis, denies current N?V   Endo/Other  Morbid obesity  Renal/GU negative Renal ROS     Musculoskeletal negative musculoskeletal ROS (+)   Abdominal   Peds  Hematology   Anesthesia Other Findings   Reproductive/Obstetrics                          Anesthesia Physical Anesthesia Plan  ASA: II  Anesthesia Plan: General   Post-op Pain Management:    Induction: Intravenous  Airway Management Planned: Oral ETT  Additional Equipment: None  Intra-op Plan:   Post-operative Plan: Extubation in OR  Informed Consent:   Dental Advisory Given  Plan Discussed with: CRNA, Surgeon and Anesthesiologist  Anesthesia Plan Comments:        Anesthesia Quick Evaluation

## 2014-02-20 NOTE — ED Notes (Signed)
Contacted bed control about bed placement.  No bed available yet.

## 2014-02-20 NOTE — H&P (Signed)
Margaret Singleton is an 43 y.o. female.   Chief Complaint: Right-sided abdominal pain HPI: This is a 42 yo female in good health who presents with a one-day history of generalized right-sided abdominal pain.  She reports no associated nausea or vomiting.  Subjective fever.  No diarrhea.  Initially evaluated at Urgent Care - later transferred to ED for evaluation.  CT showed appendicitis with no sign of perforation or abscess, as well as cholelithiasis with no sign of cholecystitis.  PMH - obesity  PSH - tubal pregnancy  No family history on file. Social History:  reports that she has never smoked. She does not have any smokeless tobacco history on file. She reports that she does not drink alcohol or use illicit drugs.  Allergies: No Known Allergies  Prior to Admission medications   Not on File     Results for orders placed during the hospital encounter of 02/19/14 (from the past 48 hour(s))  CBC WITH DIFFERENTIAL     Status: None   Collection Time    02/19/14  9:45 PM      Result Value Ref Range   WBC 8.6  4.0 - 10.5 K/uL   RBC 4.31  3.87 - 5.11 MIL/uL   Hemoglobin 12.4  12.0 - 15.0 g/dL   HCT 36.8  36.0 - 46.0 %   MCV 85.4  78.0 - 100.0 fL   MCH 28.8  26.0 - 34.0 pg   MCHC 33.7  30.0 - 36.0 g/dL   RDW 13.9  11.5 - 15.5 %   Platelets 214  150 - 400 K/uL   Neutrophils Relative % 65  43 - 77 %   Neutro Abs 5.5  1.7 - 7.7 K/uL   Lymphocytes Relative 28  12 - 46 %   Lymphs Abs 2.4  0.7 - 4.0 K/uL   Monocytes Relative 6  3 - 12 %   Monocytes Absolute 0.5  0.1 - 1.0 K/uL   Eosinophils Relative 2  0 - 5 %   Eosinophils Absolute 0.2  0.0 - 0.7 K/uL   Basophils Relative 0  0 - 1 %   Basophils Absolute 0.0  0.0 - 0.1 K/uL  COMPREHENSIVE METABOLIC PANEL     Status: None   Collection Time    02/19/14  9:45 PM      Result Value Ref Range   Sodium 141  137 - 147 mEq/L   Potassium 3.7  3.7 - 5.3 mEq/L   Chloride 104  96 - 112 mEq/L   CO2 26  19 - 32 mEq/L   Glucose, Bld 86  70 - 99  mg/dL   BUN 8  6 - 23 mg/dL   Creatinine, Ser 0.68  0.50 - 1.10 mg/dL   Calcium 8.9  8.4 - 10.5 mg/dL   Total Protein 7.9  6.0 - 8.3 g/dL   Albumin 3.5  3.5 - 5.2 g/dL   AST 21  0 - 37 U/L   ALT 21  0 - 35 U/L   Alkaline Phosphatase 90  39 - 117 U/L   Total Bilirubin 0.6  0.3 - 1.2 mg/dL   GFR calc non Af Amer >90  >90 mL/min   GFR calc Af Amer >90  >90 mL/min   Comment: (NOTE)     The eGFR has been calculated using the CKD EPI equation.     This calculation has not been validated in all clinical situations.     eGFR's persistently <90 mL/min signify possible Chronic  Kidney     Disease.  LIPASE, BLOOD     Status: None   Collection Time    02/19/14  9:45 PM      Result Value Ref Range   Lipase 12  11 - 59 U/L  URINALYSIS, ROUTINE W REFLEX MICROSCOPIC     Status: None   Collection Time    02/19/14 11:32 PM      Result Value Ref Range   Color, Urine YELLOW  YELLOW   APPearance CLEAR  CLEAR   Specific Gravity, Urine 1.012  1.005 - 1.030   pH 8.0  5.0 - 8.0   Glucose, UA NEGATIVE  NEGATIVE mg/dL   Hgb urine dipstick NEGATIVE  NEGATIVE   Bilirubin Urine NEGATIVE  NEGATIVE   Ketones, ur NEGATIVE  NEGATIVE mg/dL   Protein, ur NEGATIVE  NEGATIVE mg/dL   Urobilinogen, UA 0.2  0.0 - 1.0 mg/dL   Nitrite NEGATIVE  NEGATIVE   Leukocytes, UA NEGATIVE  NEGATIVE   Comment: MICROSCOPIC NOT DONE ON URINES WITH NEGATIVE PROTEIN, BLOOD, LEUKOCYTES, NITRITE, OR GLUCOSE <1000 mg/dL.  POC URINE PREG, ED     Status: None   Collection Time    02/19/14 11:35 PM      Result Value Ref Range   Preg Test, Ur NEGATIVE  NEGATIVE   Comment:            THE SENSITIVITY OF THIS     METHODOLOGY IS >24 mIU/mL   Ct Abdomen Pelvis W Contrast  02/20/2014   CLINICAL DATA:  Abdominal pain.  EXAM: CT ABDOMEN AND PELVIS WITH CONTRAST  TECHNIQUE: Multidetector CT imaging of the abdomen and pelvis was performed using the standard protocol following bolus administration of intravenous contrast.  CONTRAST:  82m  OMNIPAQUE IOHEXOL 300 MG/ML  SOLN  COMPARISON:  CT of the abdomen and pelvis 07/06/2013.  FINDINGS: Lung Bases: Thickening of the peribronchovascular interstitium and peribronchovascular ground-glass attenuation throughout the lower lobes of the lungs bilaterally, which is nonspecific, but could be seen in the setting of recurrent aspiration.  Abdomen/Pelvis: Image 48 of series 2 demonstrates an appendicolith in the proximal portion of the appendix. The appendix is dilated measuring up to 11 mm. Extensive periappendiceal stranding, compatible with acute inflammation. No definable periappendiceal abscess. No pneumoperitoneum to suggest frank perforation at this time.  Numerous calcified gallstones lying dependently in the gallbladder. No current findings to suggest acute cholecystitis at this time. The appearance of the liver, pancreas, spleen, bilateral discretion bilateral adrenal glands and bilateral kidneys is unremarkable. No significant volume of ascites. No pathologic distention of small bowel. No definite lymphadenopathy identified within the abdomen or pelvis. Uterus and ovaries are unremarkable in appearance. Urinary bladder is normal in appearance.  Musculoskeletal: There are no aggressive appearing lytic or blastic lesions noted in the visualized portions of the skeleton.  IMPRESSION: 1. Acute appendicitis related to an obstructing appendicolith in the proximal appendix. No periappendiceal abscess or signs of perforation at this time. Emergent surgical consultation is strongly recommended. 2. Appearance of the lung bases suggests sequela of recurrent aspiration in the lower lobes of the lungs bilaterally. 3. Cholelithiasis without findings to suggest acute cholecystitis at this time. Critical Value/emergent results were called by telephone at the time of interpretation on 02/20/2014 at 1:10 AM to Dr. DSol Passer who verbally acknowledged these results.   Electronically Signed   By: DVinnie LangtonM.D.    On: 02/20/2014 01:12    Review of Systems  Constitutional: Negative for weight loss.  HENT: Negative for ear discharge, ear pain, hearing loss and tinnitus.   Eyes: Negative for blurred vision, double vision, photophobia and pain.  Respiratory: Negative for cough, sputum production and shortness of breath.   Cardiovascular: Negative for chest pain.  Gastrointestinal: Positive for abdominal pain. Negative for nausea and vomiting.  Genitourinary: Negative for dysuria, urgency, frequency and flank pain.  Musculoskeletal: Negative for back pain, falls, joint pain, myalgias and neck pain.  Neurological: Negative for dizziness, tingling, sensory change, focal weakness, loss of consciousness and headaches.  Endo/Heme/Allergies: Does not bruise/bleed easily.  Psychiatric/Behavioral: Negative for depression, memory loss and substance abuse. The patient is not nervous/anxious.     Blood pressure 119/66, pulse 96, temperature 98.8 F (37.1 C), temperature source Oral, resp. rate 16, last menstrual period 02/05/2014, SpO2 96.00%. Physical Exam  WDWN in NAD HEENT:  EOMI, sclera anicteric Neck:  No masses, no thyromegaly Lungs:  CTA bilaterally; normal respiratory effort CV:  Regular rate and rhythm; no murmurs Abd:  +bowel sounds, obese; healed Pfannenstiel incision; tender on right side of abdomen, greatest in RLQ Ext:  Well-perfused; no edema Skin:  Warm, dry; no sign of jaundice  Assessment/Plan Acute appendicitis with no sign of perforation Cholelithiasis - no sign of cholecystitis  Admit for IV antibiotics Laparoscopic appendectomy later today.  Dennisha Mouser K. 02/20/2014, 2:17 AM

## 2014-02-20 NOTE — ED Notes (Signed)
Pt finished drinking contrast, CT made aware.  Pt resting with eyes closed

## 2014-02-20 NOTE — Anesthesia Postprocedure Evaluation (Signed)
  Anesthesia Post-op Note  Patient: Margaret Singleton  Procedure(s) Performed: Procedure(s): APPENDECTOMY LAPAROSCOPIC (N/A)  Patient Location: PACU  Anesthesia Type:General  Level of Consciousness: awake, alert  and oriented  Airway and Oxygen Therapy: Patient Spontanous Breathing  Post-op Pain: mild  Post-op Assessment: Post-op Vital signs reviewed, Patient's Cardiovascular Status Stable, Respiratory Function Stable, Patent Airway, No signs of Nausea or vomiting and Pain level controlled  Post-op Vital Signs: Reviewed and stable  Complications: No apparent anesthesia complications

## 2014-02-20 NOTE — ED Notes (Signed)
Pt returned from CT °

## 2014-02-20 NOTE — ED Notes (Signed)
Pt finished drinking contrast, CT made aware.  Pt res

## 2014-02-20 NOTE — Transfer of Care (Signed)
Immediate Anesthesia Transfer of Care Note  Patient: Margaret Singleton  Procedure(s) Performed: Procedure(s): APPENDECTOMY LAPAROSCOPIC (N/A)  Patient Location: PACU  Anesthesia Type:General  Level of Consciousness: awake, alert  and oriented  Airway & Oxygen Therapy: Patient Spontanous Breathing and Patient connected to nasal cannula oxygen  Post-op Assessment: Report given to PACU RN, Post -op Vital signs reviewed and stable and Patient moving all extremities X 4  Post vital signs: Reviewed and stable  Complications: No apparent anesthesia complications

## 2014-02-20 NOTE — Op Note (Signed)
02/19/2014 - 02/20/2014  11:31 AM  PATIENT:  Margaret Singleton  [age] y.o. female  PRE-OPERATIVE DIAGNOSIS:  Appendicitis  POST-OPERATIVE DIAGNOSIS:  Appendicitis  PROCEDURE:  Procedure(s): APPENDECTOMY LAPAROSCOPIC  SURGEON:  Surgeon(s): Liz MaladyBurke E Chimamanda Siegfried, MD  ASSISTANTS: none   ANESTHESIA:   local and general  EBL:  Total I/O In: 1000 [I.V.:1000] Out: -   BLOOD ADMINISTERED:none  DRAINS: none   SPECIMEN:  Excision  DISPOSITION OF SPECIMEN:  PATHOLOGY  COUNTS:  YES  DICTATION: .Dragon Dictation  Appendicitis. She is brought for laparoscopic appendectomy. She is in preop holding area. Informed was obtained. She received intravenous antibiotics. She the operating room and general endotracheal anesthesia was administered by the anesthesia staff. A Foley catheter was placed by nursing. Abdomen was prepped and draped in a sterile fashion. Procedure was performed. Infraumbilical region was infiltrated with local. Infraumbilical incision was made. Subcutaneous tissues were dissected down revealing the anterior fascia. This was divided sharply along the midline and the peritoneal cavity was carefully entered under direct vision. 0 Vicryl pursestring was placed around the fascial opening. Hassan trocar was inserted. Abdomen was insufflated with carbon oxide in standard fashion. Laparoscopic Exploration Revealed no complications from insertion. There were lower midline adhesions as well as some adhesions to 2 loops of small bowel in the right lower quadrant. 12 mm right lower quadrant port a 5 mm right mid port were placed. Local was used at each port site as well.These adhesions were taken down sharply away from the bowel wall. No enterotomies. Attention was directed to the cecum. The appendix was very inflamed but not perforated. The mesoappendix was taken down with harmonic scalpel achieving excellent hemostasis. The base of the appendix was intact. The base was then divided with Endo GIA with a  vascular load. There was excellent staple line closure. The appendix was placed in an Ecosac and removed from the right lower quadrant port site. Area was copiously irrigated. There was no bleeding. Staple line remained intact. Irrigation fluid returned clear. Ports removed under direct vision. Pneumoperitoneum was released. Infraumbilical fascia was closed by tying the pursestring. All 3 wounds were copiously irrigated the skin which was closed with running 4-0 Vicryl subcuticular followed by Dermabond. All counts were correct. Patient tolerated the procedure well without apparent application was taken recovery in stable condition.  PATIENT DISPOSITION:  PACU - hemodynamically stable.   Delay start of Pharmacological VTE agent (>24hrs) due to surgical blood loss or risk of bleeding:  no  Violeta GelinasBurke Tayley Mudrick, MD, MPH, FACS Pager: [phone]  3/25/201511:31 AM

## 2014-02-20 NOTE — Progress Notes (Signed)
UR Completed.  Margaret Singleton [phone] 02/20/2014  

## 2014-02-20 NOTE — Progress Notes (Signed)
Subjective: RLQ pain  Objective: Vital signs in last 24 hours: Temp:  [98.1 F (36.7 C)-99.1 F (37.3 C)] 98.1 F (36.7 C) (03/25 0622) Pulse Rate:  [78-96] 78 (03/25 0622) Resp:  [16-20] 18 (03/25 0622) BP: (103-142)/(59-97) 117/74 mmHg (03/25 0622) SpO2:  [94 %-100 %] 98 % (03/25 0622) Weight:  [175 lb 14.8 oz (79.8 kg)] 175 lb 14.8 oz (79.8 kg) (03/25 0622)    Intake/Output from previous day:   Intake/Output this shift:    General appearance: alert and cooperative Resp: clear to auscultation bilaterally Cardio: regular rate and rhythm GI: soft, tender RLQ with voluntary guarding  Lab Results:   Recent Labs  02/19/14 2145  WBC 8.6  HGB 12.4  HCT 36.8  PLT 214   BMET  Recent Labs  02/19/14 2145  NA 141  K 3.7  CL 104  CO2 26  GLUCOSE 86  BUN 8  CREATININE 0.68  CALCIUM 8.9   PT/INR No results found for this basename: LABPROT, INR,  in the last 72 hours ABG No results found for this basename: PHART, PCO2, PO2, HCO3,  in the last 72 hours  Studies/Results: Ct Abdomen Pelvis W Contrast  02/20/2014   CLINICAL DATA:  Abdominal pain.  EXAM: CT ABDOMEN AND PELVIS WITH CONTRAST  TECHNIQUE: Multidetector CT imaging of the abdomen and pelvis was performed using the standard protocol following bolus administration of intravenous contrast.  CONTRAST:  80mL OMNIPAQUE IOHEXOL 300 MG/ML  SOLN  COMPARISON:  CT of the abdomen and pelvis 07/06/2013.  FINDINGS: Lung Bases: Thickening of the peribronchovascular interstitium and peribronchovascular ground-glass attenuation throughout the lower lobes of the lungs bilaterally, which is nonspecific, but could be seen in the setting of recurrent aspiration.  Abdomen/Pelvis: Image 48 of series 2 demonstrates an appendicolith in the proximal portion of the appendix. The appendix is dilated measuring up to 11 mm. Extensive periappendiceal stranding, compatible with acute inflammation. No definable periappendiceal abscess. No  pneumoperitoneum to suggest frank perforation at this time.  Numerous calcified gallstones lying dependently in the gallbladder. No current findings to suggest acute cholecystitis at this time. The appearance of the liver, pancreas, spleen, bilateral discretion bilateral adrenal glands and bilateral kidneys is unremarkable. No significant volume of ascites. No pathologic distention of small bowel. No definite lymphadenopathy identified within the abdomen or pelvis. Uterus and ovaries are unremarkable in appearance. Urinary bladder is normal in appearance.  Musculoskeletal: There are no aggressive appearing lytic or blastic lesions noted in the visualized portions of the skeleton.  IMPRESSION: 1. Acute appendicitis related to an obstructing appendicolith in the proximal appendix. No periappendiceal abscess or signs of perforation at this time. Emergent surgical consultation is strongly recommended. 2. Appearance of the lung bases suggests sequela of recurrent aspiration in the lower lobes of the lungs bilaterally. 3. Cholelithiasis without findings to suggest acute cholecystitis at this time. Critical Value/emergent results were called by telephone at the time of interpretation on 02/20/2014 at 1:10 AM to Dr. Pilar Jarvis, who verbally acknowledged these results.   Electronically Signed   By: Trudie Reed M.D.   On: 02/20/2014 01:12    Anti-infectives: Anti-infectives   Start     Dose/Rate Route Frequency Ordered Stop   02/20/14 0630  piperacillin-tazobactam (ZOSYN) IVPB 3.375 g     3.375 g 12.5 mL/hr over 240 Minutes Intravenous 3 times per day 02/20/14 0610        Assessment/Plan: Acute appendicitis - for laparoscopic appendectomy this AM. Procedure, risks, benefits D/W her and  her daughter. She agrees. On Zosyn.  LOS: 1 day    Danyia Borunda E 02/20/2014

## 2014-02-20 NOTE — ED Notes (Signed)
Patient is off the unit.

## 2014-02-20 NOTE — ED Provider Notes (Signed)
CSN: [id number]     Arrival date & time 02/19/14  1952 History   First MD Initiated Contact with Patient 02/19/14 2150     Chief Complaint  Patient presents with  . Abdominal Pain     (Consider location/radiation/quality/duration/timing/severity/associated sxs/prior Treatment) Patient is a 43 y.o. female presenting with abdominal pain. The history is provided by the patient.  Abdominal Pain Pain location:  Generalized Pain quality: sharp and shooting   Pain radiates to:  Does not radiate Pain severity:  Moderate Onset quality:  Gradual Duration:  1 day Timing:  Constant Progression:  Unchanged Chronicity:  New Context: not alcohol use, not awakening from sleep, not diet changes, not previous surgeries and not trauma   Relieved by:  Nothing Worsened by:  Palpation Ineffective treatments:  None tried Associated symptoms: chills   Associated symptoms: no anorexia, no chest pain, no cough, no diarrhea, no dysuria, no fever, no hematemesis, no hematochezia, no hematuria, no melena, no nausea, no shortness of breath, no vaginal bleeding, no vaginal discharge and no vomiting   Risk factors: obesity   Risk factors: no alcohol abuse and has not had multiple surgeries     History reviewed. No pertinent past medical history. History reviewed. No pertinent past surgical history. No family history on file. History  Substance Use Topics  . Smoking status: Never Smoker   . Smokeless tobacco: Not on file  . Alcohol Use: No   OB History   Grav Para Term Preterm Abortions TAB SAB Ect Mult Living                 Review of Systems  Constitutional: Positive for chills. Negative for fever, activity change and appetite change.  HENT: Negative for congestion.   Eyes: Negative for discharge, redness and itching.  Respiratory: Negative for cough, shortness of breath and wheezing.   Cardiovascular: Negative for chest pain and palpitations.  Gastrointestinal: Positive for abdominal pain.  Negative for nausea, vomiting, diarrhea, melena, hematochezia, anorexia and hematemesis.  Genitourinary: Negative for dysuria, hematuria, decreased urine volume, vaginal bleeding, vaginal discharge and difficulty urinating.  Skin: Negative for rash and wound.  Neurological: Negative for syncope.      Allergies  Review of patient's allergies indicates no known allergies.  Home Medications  No current outpatient prescriptions on file. BP 100/65  Pulse 80  Temp(Src) 97.8 F (36.6 C) (Oral)  Resp 17  Ht 4\' 9"  (1.448 m)  Wt 175 lb 14.8 oz (79.8 kg)  BMI 38.06 kg/m2  SpO2 94%  LMP 02/05/2014 Physical Exam  Constitutional: She is oriented to person, place, and time. She appears well-developed and well-nourished. No distress.  HENT:  Head: Normocephalic and atraumatic.  Mouth/Throat: Oropharynx is clear and moist.  Eyes: Conjunctivae and EOM are normal. Pupils are equal, round, and reactive to light. Right eye exhibits no discharge. Left eye exhibits no discharge. No scleral icterus.  Neck: Normal range of motion. Neck supple.  Cardiovascular: Normal rate, regular rhythm and intact distal pulses.  Exam reveals no gallop and no friction rub.   No murmur heard. Pulmonary/Chest: Effort normal and breath sounds normal. No respiratory distress. She has no wheezes. She has no rales.  Abdominal: Soft. She exhibits no distension and no mass. There is tenderness (ttp diffusely, RUQ, RLQ, LLQ. no rgidity, patient guards before you palpate but is resting quietly when not examind. ). There is guarding (voluntarily guards).  Musculoskeletal: Normal range of motion.  Neurological: She is alert and oriented to person, place,  and time. No cranial nerve deficit. She exhibits normal muscle tone. Coordination normal.  Skin: She is not diaphoretic.    ED Course  Procedures (including critical care time) Labs Review Labs Reviewed  SURGICAL PCR SCREEN  URINE CULTURE  CBC WITH DIFFERENTIAL   COMPREHENSIVE METABOLIC PANEL  URINALYSIS, ROUTINE W REFLEX MICROSCOPIC  LIPASE, BLOOD  POC URINE PREG, ED   Imaging Review Ct Abdomen Pelvis W Contrast  02/20/2014   CLINICAL DATA:  Abdominal pain.  EXAM: CT ABDOMEN AND PELVIS WITH CONTRAST  TECHNIQUE: Multidetector CT imaging of the abdomen and pelvis was performed using the standard protocol following bolus administration of intravenous contrast.  CONTRAST:  80mL OMNIPAQUE IOHEXOL 300 MG/ML  SOLN  COMPARISON:  CT of the abdomen and pelvis 07/06/2013.  FINDINGS: Lung Bases: Thickening of the peribronchovascular interstitium and peribronchovascular ground-glass attenuation throughout the lower lobes of the lungs bilaterally, which is nonspecific, but could be seen in the setting of recurrent aspiration.  Abdomen/Pelvis: Image 48 of series 2 demonstrates an appendicolith in the proximal portion of the appendix. The appendix is dilated measuring up to 11 mm. Extensive periappendiceal stranding, compatible with acute inflammation. No definable periappendiceal abscess. No pneumoperitoneum to suggest frank perforation at this time.  Numerous calcified gallstones lying dependently in the gallbladder. No current findings to suggest acute cholecystitis at this time. The appearance of the liver, pancreas, spleen, bilateral discretion bilateral adrenal glands and bilateral kidneys is unremarkable. No significant volume of ascites. No pathologic distention of small bowel. No definite lymphadenopathy identified within the abdomen or pelvis. Uterus and ovaries are unremarkable in appearance. Urinary bladder is normal in appearance.  Musculoskeletal: There are no aggressive appearing lytic or blastic lesions noted in the visualized portions of the skeleton.  IMPRESSION: 1. Acute appendicitis related to an obstructing appendicolith in the proximal appendix. No periappendiceal abscess or signs of perforation at this time. Emergent surgical consultation is strongly  recommended. 2. Appearance of the lung bases suggests sequela of recurrent aspiration in the lower lobes of the lungs bilaterally. 3. Cholelithiasis without findings to suggest acute cholecystitis at this time. Critical Value/emergent results were called by telephone at the time of interpretation on 02/20/2014 at 1:10 AM to Dr. Pilar JarvisUG Laren Orama, who verbally acknowledged these results.   Electronically Signed   By: Trudie Reedaniel  Entrikin M.D.   On: 02/20/2014 01:12     EKG Interpretation None      MDM   MDM: 43 y.o. AAF w/ 1 day of abd pain. Denies n/v/d, urinary sxs, f/c. No hx of similar. Pain diffuse. Went to UC before who recommend come here. AFVSS, well appearing. Abd diffusely tender. Will get labs, urine, CT. Pain meds given with improvement in pain.U preg neg. UA negative. Labs negative. CT scan shows concern for appy. Vitals remain stable while in ED. Pain controlled. Surgery consulted, to admit. Care of case d/w my attending.  Final diagnoses:  Acute appendicitis    Admit  Pilar Jarvisoug Shirley Bolle, MD 02/21/14 [phone]

## 2014-02-20 NOTE — ED Notes (Signed)
Pt to CT at this time.

## 2014-02-20 NOTE — Anesthesia Procedure Notes (Signed)
Procedure Name: Intubation Date/Time: 02/20/2014 10:37 AM Performed by: Carmela RimaMARTINELLI, Margaret Singleton Pre-anesthesia Checklist: Patient identified, Emergency Drugs available, Timeout performed, Suction available and Patient being monitored Patient Re-evaluated:Patient Re-evaluated prior to inductionOxygen Delivery Method: Circle system utilized Preoxygenation: Pre-oxygenation with 100% oxygen Intubation Type: IV induction Ventilation: Mask ventilation without difficulty Laryngoscope Size: Mac and 3 (Intubation by [name], EMT student) Grade View: Grade I Tube type: Oral Tube size: 7.5 mm Number of attempts: 1 Placement Confirmation: positive ETCO2,  ETT inserted through vocal cords under direct vision and breath sounds checked- equal and bilateral Secured at: 21 cm Tube secured with: Tape Dental Injury: Teeth and Oropharynx as per pre-operative assessment

## 2014-02-21 DIAGNOSIS — K8020 Calculus of gallbladder without cholecystitis without obstruction: Secondary | ICD-10-CM | POA: Diagnosis present

## 2014-02-21 HISTORY — DX: Calculus of gallbladder without cholecystitis without obstruction: K80.20

## 2014-02-21 LAB — URINE CULTURE: Colony Count: >=100000

## 2014-02-21 MED ORDER — SODIUM CHLORIDE 0.9 % IJ SOLN: 3.0000 mL | INTRAMUSCULAR | Status: DC | PRN

## 2014-02-21 MED ORDER — OXYCODONE HCL 5 MG PO TABS: 5.0000 mg | ORAL_TABLET | ORAL | Status: DC | PRN

## 2014-02-21 MED ORDER — KETOROLAC TROMETHAMINE 30 MG/ML IJ SOLN
30.0000 mg | Freq: Once | INTRAMUSCULAR | Status: AC
  Administered 2014-02-21: 30 mg via INTRAVENOUS
  Filled 2014-02-21: qty 1

## 2014-02-21 MED ORDER — POLYETHYLENE GLYCOL 3350 17 G PO PACK: 17.0000 g | PACK | Freq: Every day | ORAL | Status: DC

## 2014-02-21 MED ORDER — SODIUM CHLORIDE 0.9 % IJ SOLN: 3.0000 mL | Freq: Two times a day (BID) | INTRAMUSCULAR | Status: DC

## 2014-02-21 MED ORDER — IBUPROFEN 600 MG PO TABS: 600.0000 mg | ORAL_TABLET | Freq: Four times a day (QID) | ORAL | Status: DC | PRN

## 2014-02-21 MED ORDER — IBUPROFEN 600 MG PO TABS
600.0000 mg | ORAL_TABLET | Freq: Four times a day (QID) | ORAL | Status: DC | PRN
Start: 2014-02-21 — End: 2014-02-21
  Filled 2014-02-21: qty 1

## 2014-02-21 NOTE — Progress Notes (Signed)
Patient discharge teaching given, including activity, diet, follow-up appoints, and medications. Patient verbalized understanding of all discharge instructions. IV access was d/c'd. Vitals are stable. Skin is intact except as charted in most recent assessments. Pt to be escorted out by NT, to be driven home by family.  Yardley Beltran, MBA, BS, RN 

## 2014-02-21 NOTE — Discharge Summary (Addendum)
   Margaret GelinasBurke Breyona Swander, MD, MPH, FACS Trauma: [phone] General Surgery: [phone]  02/21/2014 2:28 PM

## 2014-02-21 NOTE — Discharge Summary (Signed)
Physician Discharge Summary  Patient ID: Margaret Grumblinglizabeth Goodreau MRN: [id number] DOB/AGE: February 08, 1971 43 y.o.  Admit date: 02/19/2014 Discharge date: 02/21/2014  Admitting Diagnosis: Acute appendicitis  Discharge Diagnosis Patient Active Problem List   Diagnosis Date Noted  . Acute appendicitis 02/20/2014    Consultants none  Imaging: Ct Abdomen Pelvis W Contrast  02/20/2014   CLINICAL DATA:  Abdominal pain.  EXAM: CT ABDOMEN AND PELVIS WITH CONTRAST  TECHNIQUE: Multidetector CT imaging of the abdomen and pelvis was performed using the standard protocol following bolus administration of intravenous contrast.  CONTRAST:  80mL OMNIPAQUE IOHEXOL 300 MG/ML  SOLN  COMPARISON:  CT of the abdomen and pelvis 07/06/2013.  FINDINGS: Lung Bases: Thickening of the peribronchovascular interstitium and peribronchovascular ground-glass attenuation throughout the lower lobes of the lungs bilaterally, which is nonspecific, but could be seen in the setting of recurrent aspiration.  Abdomen/Pelvis: Image 48 of series 2 demonstrates an appendicolith in the proximal portion of the appendix. The appendix is dilated measuring up to 11 mm. Extensive periappendiceal stranding, compatible with acute inflammation. No definable periappendiceal abscess. No pneumoperitoneum to suggest frank perforation at this time.  Numerous calcified gallstones lying dependently in the gallbladder. No current findings to suggest acute cholecystitis at this time. The appearance of the liver, pancreas, spleen, bilateral discretion bilateral adrenal glands and bilateral kidneys is unremarkable. No significant volume of ascites. No pathologic distention of small bowel. No definite lymphadenopathy identified within the abdomen or pelvis. Uterus and ovaries are unremarkable in appearance. Urinary bladder is normal in appearance.  Musculoskeletal: There are no aggressive appearing lytic or blastic lesions noted in the visualized portions of the skeleton.   IMPRESSION: 1. Acute appendicitis related to an obstructing appendicolith in the proximal appendix. No periappendiceal abscess or signs of perforation at this time. Emergent surgical consultation is strongly recommended. 2. Appearance of the lung bases suggests sequela of recurrent aspiration in the lower lobes of the lungs bilaterally. 3. Cholelithiasis without findings to suggest acute cholecystitis at this time. Critical Value/emergent results were called by telephone at the time of interpretation on 02/20/2014 at 1:10 AM to Dr. Pilar JarvisUG BRTALIK, who verbally acknowledged these results.   Electronically Signed   By: Trudie Reedaniel  Entrikin M.D.   On: 02/20/2014 01:12    Procedures Laparoscopic appendectomy---Dr. Janee Mornhompson 02/20/14  Hospital Course:  [facility]ided [facility].  Workup showed appendicitis with no signs of perforation or abscess.  Patient was admitted and underwent procedure listed above.  Tolerated procedure well and was transferred to the floor.  Diet was advanced as tolerated.  On POD#1, the patient was voiding well, tolerating diet, ambulating well, pain well controlled, vital signs stable, incisions c/d/i and felt stable for discharge home.  Patient will follow up in our office in 3 weeks and knows to call with questions or concerns.  Physical Exam: General:  Alert, NAD, pleasant, comfortable Abd:  Soft, ND, mild tenderness, incisions C/D/I    Medication List         ibuprofen 600 MG tablet  Commonly known as:  ADVIL,MOTRIN  Take 1 tablet (600 mg total) by mouth every 6 (six) hours as needed for fever or headache.     oxyCODONE 5 MG immediate release tablet  Commonly known as:  Oxy IR/ROXICODONE  Take 1-2 tablets (5-10 mg total) by mouth every 4 (four) hours as needed (pain).     polyethylene glycol packet  Commonly known as:  MIRALAX  Take 17 g by mouth daily.  Follow-up Information   Follow up with [facility]  [place] On 03/19/2014. (arrive by 1:30PM for a 2PM appt)    Contact information:   [address]   [place] [address] [phone]       Signed: Ashok Norris, [facility] [phone]  02/21/2014, 1:02 PM

## 2014-02-21 NOTE — Progress Notes (Signed)
Patient ID: Margaret Singleton, female   DOB: 03-10-1971, 43 y.o.   MRN: [id number]  Subjective: Tolerating diet.  No flatus.  Voiding.  10/10 abdominal pain earlier.  Has not been OOB.   Objective:  Vital signs:  Filed Vitals:   02/20/14 2239 02/21/14 0419 02/21/14 0444 02/21/14 0937  BP: 121/78 93/49 100/65 118/80  Pulse: 78 80  67  Temp: 98.3 F (36.8 C) 97.8 F (36.6 C)  97.3 F (36.3 C)  TempSrc: Oral Oral  Oral  Resp: 18 17  18   Height:      Weight:      SpO2: 96% 94%  93%    Intake/Output   Yesterday:  03/25 0701 - 03/26 0700 In: 2551.7 [I.V.:2451.7; IV Piggyback:100] Out: 100 [Urine:100] This shift:    I/O last 3 completed shifts: In: 2551.7 [I.V.:2451.7; IV Piggyback:100] Out: 100 [Urine:100]    Physical Exam: General: Pt awake/alert/oriented x3 in no acute distress Chest: cta.  No chest wall pain w good excursion CV:  Pulses intact.  Regular rhythm Abdomen: Soft.  Nondistended.  Mildly tender at incisions only.  No evidence of peritonitis.  No incarcerated hernias. Ext:  SCDs BLE.  No mjr edema.  No cyanosis Skin: No petechiae / purpura   Problem List:   Active Problems:   Acute appendicitis    Results:   Labs: Results for orders placed during the hospital encounter of 02/19/14 (from the past 48 hour(s))  CBC WITH DIFFERENTIAL     Status: None   Collection Time    02/19/14  9:45 PM      Result Value Ref Range   WBC 8.6  4.0 - 10.5 K/uL   RBC 4.31  3.87 - 5.11 MIL/uL   Hemoglobin 12.4  12.0 - 15.0 g/dL   HCT 36.8  36.0 - 46.0 %   MCV 85.4  78.0 - 100.0 fL   MCH 28.8  26.0 - 34.0 pg   MCHC 33.7  30.0 - 36.0 g/dL   RDW 13.9  11.5 - 15.5 %   Platelets 214  150 - 400 K/uL   Neutrophils Relative % 65  43 - 77 %   Neutro Abs 5.5  1.7 - 7.7 K/uL   Lymphocytes Relative 28  12 - 46 %   Lymphs Abs 2.4  0.7 - 4.0 K/uL   Monocytes Relative 6  3 - 12 %   Monocytes Absolute 0.5  0.1 - 1.0 K/uL   Eosinophils Relative 2  0 - 5 %   Eosinophils Absolute  0.2  0.0 - 0.7 K/uL   Basophils Relative 0  0 - 1 %   Basophils Absolute 0.0  0.0 - 0.1 K/uL  COMPREHENSIVE METABOLIC PANEL     Status: None   Collection Time    02/19/14  9:45 PM      Result Value Ref Range   Sodium 141  137 - 147 mEq/L   Potassium 3.7  3.7 - 5.3 mEq/L   Chloride 104  96 - 112 mEq/L   CO2 26  19 - 32 mEq/L   Glucose, Bld 86  70 - 99 mg/dL   BUN 8  6 - 23 mg/dL   Creatinine, Ser 0.68  0.50 - 1.10 mg/dL   Calcium 8.9  8.4 - 10.5 mg/dL   Total Protein 7.9  6.0 - 8.3 g/dL   Albumin 3.5  3.5 - 5.2 g/dL   AST 21  0 - 37 U/L   ALT 21  0 -  35 U/L   Alkaline Phosphatase 90  39 - 117 U/L   Total Bilirubin 0.6  0.3 - 1.2 mg/dL   GFR calc non Af Amer >90  >90 mL/min   GFR calc Af Amer >90  >90 mL/min   Comment: (NOTE)     The eGFR has been calculated using the CKD EPI equation.     This calculation has not been validated in all clinical situations.     eGFR's persistently <90 mL/min signify possible Chronic Kidney     Disease.  LIPASE, BLOOD     Status: None   Collection Time    02/19/14  9:45 PM      Result Value Ref Range   Lipase 12  11 - 59 U/L  URINALYSIS, ROUTINE W REFLEX MICROSCOPIC     Status: None   Collection Time    02/19/14 11:32 PM      Result Value Ref Range   Color, Urine YELLOW  YELLOW   APPearance CLEAR  CLEAR   Specific Gravity, Urine 1.012  1.005 - 1.030   pH 8.0  5.0 - 8.0   Glucose, UA NEGATIVE  NEGATIVE mg/dL   Hgb urine dipstick NEGATIVE  NEGATIVE   Bilirubin Urine NEGATIVE  NEGATIVE   Ketones, ur NEGATIVE  NEGATIVE mg/dL   Protein, ur NEGATIVE  NEGATIVE mg/dL   Urobilinogen, UA 0.2  0.0 - 1.0 mg/dL   Nitrite NEGATIVE  NEGATIVE   Leukocytes, UA NEGATIVE  NEGATIVE   Comment: MICROSCOPIC NOT DONE ON URINES WITH NEGATIVE PROTEIN, BLOOD, LEUKOCYTES, NITRITE, OR GLUCOSE <1000 mg/dL.  POC URINE PREG, ED     Status: None   Collection Time    02/19/14 11:35 PM      Result Value Ref Range   Preg Test, Ur NEGATIVE  NEGATIVE   Comment:             THE SENSITIVITY OF THIS     METHODOLOGY IS >24 mIU/mL  SURGICAL PCR SCREEN     Status: None   Collection Time    02/20/14  8:23 AM      Result Value Ref Range   MRSA, PCR NEGATIVE  NEGATIVE   Staphylococcus aureus NEGATIVE  NEGATIVE   Comment:            The Xpert SA Assay (FDA     approved for NASAL specimens     in patients over 73 years of age),     is one component of     a comprehensive surveillance     program.  Test performance has     been validated by [employer] for patients greater     than or equal to 53 year old.     It is not intended     to diagnose infection nor to     guide or monitor treatment.    Imaging / Studies: Ct Abdomen Pelvis W Contrast  02/20/2014   CLINICAL DATA:  Abdominal pain.  EXAM: CT ABDOMEN AND PELVIS WITH CONTRAST  TECHNIQUE: Multidetector CT imaging of the abdomen and pelvis was performed using the standard protocol following bolus administration of intravenous contrast.  CONTRAST:  34m OMNIPAQUE IOHEXOL 300 MG/ML  SOLN  COMPARISON:  CT of the abdomen and pelvis 07/06/2013.  FINDINGS: Lung Bases: Thickening of the peribronchovascular interstitium and peribronchovascular ground-glass attenuation throughout the lower lobes of the lungs bilaterally, which is nonspecific, but could be seen in the setting of recurrent aspiration.  Abdomen/Pelvis: Image 48 of series 2 demonstrates an appendicolith in the proximal portion of the appendix. The appendix is dilated measuring up to 11 mm. Extensive periappendiceal stranding, compatible with acute inflammation. No definable periappendiceal abscess. No pneumoperitoneum to suggest frank perforation at this time.  Numerous calcified gallstones lying dependently in the gallbladder. No current findings to suggest acute cholecystitis at this time. The appearance of the liver, pancreas, spleen, bilateral discretion bilateral adrenal glands and bilateral kidneys is unremarkable. No significant volume of ascites. No  pathologic distention of small bowel. No definite lymphadenopathy identified within the abdomen or pelvis. Uterus and ovaries are unremarkable in appearance. Urinary bladder is normal in appearance.  Musculoskeletal: There are no aggressive appearing lytic or blastic lesions noted in the visualized portions of the skeleton.  IMPRESSION: 1. Acute appendicitis related to an obstructing appendicolith in the proximal appendix. No periappendiceal abscess or signs of perforation at this time. Emergent surgical consultation is strongly recommended. 2. Appearance of the lung bases suggests sequela of recurrent aspiration in the lower lobes of the lungs bilaterally. 3. Cholelithiasis without findings to suggest acute cholecystitis at this time. Critical Value/emergent results were called by telephone at the time of interpretation on 02/20/2014 at 1:10 AM to Dr. Sol Passer, who verbally acknowledged these results.   Electronically Signed   By: Vinnie Langton M.D.   On: 02/20/2014 01:12    Medications / Allergies: per chart  Antibiotics: Anti-infectives   Start     Dose/Rate Route Frequency Ordered Stop   02/20/14 0630  piperacillin-tazobactam (ZOSYN) IVPB 3.375 g     3.375 g 12.5 mL/hr over 240 Minutes Intravenous 3 times per day 02/20/14 0610        Assessment/Plan Margaret Singleton  43 y.o. female  1 Day Post-Op  Procedure(s): APPENDECTOMY LAPAROSCOPIC  -give toradol x1 now, add Ibuprofen PRN -DV IV pain meds -oxycodone q4h prn for pain -mobilize -IS -will check on her this afternoon to ensure her pain is better and hopefully discharge home  Erby Pian, [facility] Pager [phone] Office [phone]  02/21/2014 9:53 AM

## 2014-02-21 NOTE — Discharge Instructions (Signed)

## 2014-02-21 NOTE — Progress Notes (Signed)
Agree Krysten Veronica, MD, MPH, FACS Trauma: [phone] General Surgery: [phone]  

## 2014-02-22 ENCOUNTER — Encounter (HOSPITAL_COMMUNITY): Payer: Self-pay | Admitting: General Surgery

## 2014-02-22 NOTE — ED Provider Notes (Signed)
I saw and evaluated the patient, reviewed the resident's note and I agree with the findings and plan.   .Face to face Exam:  General:  Awake HEENT:  Atraumatic Resp:  Normal effort Abd:  No distension Neuro:No focal weakness   Nelia Shiobert L Ash Mcelwain, MD 02/22/14 1034

## 2014-03-07 ENCOUNTER — Telehealth (INDEPENDENT_AMBULATORY_CARE_PROVIDER_SITE_OTHER): Payer: Self-pay | Admitting: General Surgery

## 2014-03-07 NOTE — Telephone Encounter (Signed)
Pt called via daughter, Steward DroneBrenda, with pt next to her.  Pt asking for extension of time off from work; she was given 3 weeks out from lap appe on 02/20/14, with RTW on 03/11/14.  She is complaining of pain at the umbilicus and on the abdomen at the site of her appendix.  Denies fever, nausea or vomiting.  Advised using ice packs to site of discomfort and continuing to use Ibuprofen ( 600 mg Q6H or 800 mg Q8H) for pain control.  Discussed with Barnetta ChapelKelly Osborne, PA-C, who will have pt evaluated at the next [facility] clinic via overbook.  LMOM for pt to call back:  Appt 03/12/13 at 1:30 pm.

## 2014-03-12 ENCOUNTER — Encounter (INDEPENDENT_AMBULATORY_CARE_PROVIDER_SITE_OTHER): Payer: Self-pay

## 2014-03-12 ENCOUNTER — Ambulatory Visit (INDEPENDENT_AMBULATORY_CARE_PROVIDER_SITE_OTHER): Payer: BC Managed Care – PPO | Admitting: General Surgery

## 2014-03-12 ENCOUNTER — Encounter (INDEPENDENT_AMBULATORY_CARE_PROVIDER_SITE_OTHER): Payer: Self-pay | Admitting: General Surgery

## 2014-03-12 VITALS — BP 126/80 | HR 75 | Temp 98.3°F | Ht <= 58 in | Wt 180.6 lb

## 2014-03-12 DIAGNOSIS — K3580 Unspecified acute appendicitis: Secondary | ICD-10-CM

## 2014-03-12 NOTE — Progress Notes (Signed)
Margaret Singleton 11/05/1971 [id number] 03/12/2014   History of Present Illness: Margaret Singleton is a  43 y.o. female who presents today status post lap appy by Dr. Violeta GelinasBurke Thompson.  Pathology reveals Appendix, Other than Incidental - APPENDICITIS AND SEROSITIS. - THERE IS NO EVIDENCE OF MALIGNANCY..  The patient is tolerating a regular diet, having normal bowel movements, has good pain control.  She  is back to most normal activities.  Still having constipation issues Physical Exam:  BP 126/80  Pulse 75  Temp(Src) 98.3 F (36.8 C) (Oral)  Ht 4\' 9"  (1.448 m)  Wt 81.92 kg (180 lb 9.6 oz)  BMI 39.07 kg/m2  LMP 02/05/2014  Abd: soft, nontender, active bowel sounds, nondistended.  All incisions are well healed.  Complains of pain in abdominal port site. It is healing nicely, no redness or tenderness. She did have some suture sticking out of the umbilical site and I removed that.  Impression: 1.  Acute appendicitis, s/p lap appy 02/20/14  Plan: She  is able to return to normal activities. She  may follow up on a prn basis.  Colace for constipation, high fiber diet, and Miralax as need.  She can resume ibuprofen for pain as needed.  Oxycodone make her itch.

## 2014-03-12 NOTE — Patient Instructions (Signed)
Stop the oxycodone.  You can take ibuprofen 200 mg tablets.  You can take 2-3 tablets every 6 hours as needed.  You can buy this at any drug store. Colace  You can take this as a stool softener  Miralax, you can use this for constipation also. .High-Fiber Diet Fiber is found in fruits, vegetables, and grains. A high-fiber diet encourages the addition of more whole grains, legumes, fruits, and vegetables in your diet. The recommended amount of fiber for adult males is 38 g per day. For adult females, it is 25 g per day. Pregnant and lactating women should get 28 g of fiber per day. If you have a digestive or bowel problem, ask your caregiver for advice before adding high-fiber foods to your diet. Eat a variety of high-fiber foods instead of only a select few type of foods.  PURPOSE  To increase stool bulk.  To make bowel movements more regular to prevent constipation.  To lower cholesterol.  To prevent overeating. WHEN IS THIS DIET USED?  It may be used if you have constipation and hemorrhoids.  It may be used if you have uncomplicated diverticulosis (intestine condition) and irritable bowel syndrome.  It may be used if you need help with weight management.  It may be used if you want to add it to your diet as a protective measure against atherosclerosis, diabetes, and cancer. SOURCES OF FIBER  Whole-grain breads and cereals.  Fruits, such as apples, oranges, bananas, berries, prunes, and pears.  Vegetables, such as green peas, carrots, sweet potatoes, beets, broccoli, cabbage, spinach, and artichokes.  Legumes, such split peas, soy, lentils.  Almonds. FIBER CONTENT IN FOODS Starches and Grains / Dietary Fiber (g)  Cheerios, 1 cup / 3 g  Corn Flakes cereal, 1 cup / 0.7 g  Rice crispy treat cereal, 1 cup / 0.3 g  Instant oatmeal (cooked),  cup / 2 g  Frosted wheat cereal, 1 cup / 5.1 g  Brown, long-grain rice (cooked), 1 cup / 3.5 g  White, long-grain rice (cooked), 1  cup / 0.6 g  Enriched macaroni (cooked), 1 cup / 2.5 g Legumes / Dietary Fiber (g)  Baked beans (canned, plain, or vegetarian),  cup / 5.2 g  Kidney beans (canned),  cup / 6.8 g  Pinto beans (cooked),  cup / 5.5 g Breads and Crackers / Dietary Fiber (g)  Plain or honey graham crackers, 2 squares / 0.7 g  Saltine crackers, 3 squares / 0.3 g  Plain, salted pretzels, 10 pieces / 1.8 g  Whole-wheat bread, 1 slice / 1.9 g  White bread, 1 slice / 0.7 g  Raisin bread, 1 slice / 1.2 g  Plain bagel, 3 oz / 2 g  Flour tortilla, 1 oz / 0.9 g  Corn tortilla, 1 small / 1.5 g  Hamburger or hotdog bun, 1 small / 0.9 g Fruits / Dietary Fiber (g)  Apple with skin, 1 medium / 4.4 g  Sweetened applesauce,  cup / 1.5 g  Banana,  medium / 1.5 g  Grapes, 10 grapes / 0.4 g  Orange, 1 small / 2.3 g  Raisin, 1.5 oz / 1.6 g  Melon, 1 cup / 1.4 g Vegetables / Dietary Fiber (g)  Green beans (canned),  cup / 1.3 g  Carrots (cooked),  cup / 2.3 g  Broccoli (cooked),  cup / 2.8 g  Peas (cooked),  cup / 4.4 g  Mashed potatoes,  cup / 1.6 g  Lettuce, 1  cup / 0.5 g  Corn (canned),  cup / 1.6 g  Tomato,  cup / 1.1 g Document Released: 11/15/2005 Document Revised: 05/16/2012 Document Reviewed: 02/17/2012 [facility] Patient Information 2014 [place]. Call if you have any further problems.

## 2014-03-19 ENCOUNTER — Encounter (INDEPENDENT_AMBULATORY_CARE_PROVIDER_SITE_OTHER): Payer: BC Managed Care – PPO

## 2015-02-26 ENCOUNTER — Emergency Department (HOSPITAL_COMMUNITY)
Admission: EM | Admit: 2015-02-26 | Discharge: 2015-02-26 | Disposition: A | Discharge status: Home / Self Care | Payer: BC Managed Care – PPO | Attending: Emergency Medicine | Admitting: Emergency Medicine

## 2015-02-26 ENCOUNTER — Emergency Department (HOSPITAL_COMMUNITY): Payer: BC Managed Care – PPO

## 2015-02-26 ENCOUNTER — Encounter (HOSPITAL_COMMUNITY): Payer: Self-pay | Admitting: Emergency Medicine

## 2015-02-26 DIAGNOSIS — S0993XA Unspecified injury of face, initial encounter: Secondary | ICD-10-CM | POA: Diagnosis not present

## 2015-02-26 DIAGNOSIS — Y998 Other external cause status: Secondary | ICD-10-CM | POA: Insufficient documentation

## 2015-02-26 DIAGNOSIS — Y92410 Unspecified street and highway as the place of occurrence of the external cause: Secondary | ICD-10-CM | POA: Diagnosis not present

## 2015-02-26 DIAGNOSIS — R079 Chest pain, unspecified: Secondary | ICD-10-CM

## 2015-02-26 DIAGNOSIS — I10 Essential (primary) hypertension: Secondary | ICD-10-CM | POA: Diagnosis not present

## 2015-02-26 DIAGNOSIS — S299XXA Unspecified injury of thorax, initial encounter: Secondary | ICD-10-CM | POA: Diagnosis not present

## 2015-02-26 DIAGNOSIS — Y9389 Activity, other specified: Secondary | ICD-10-CM | POA: Diagnosis not present

## 2015-02-26 DIAGNOSIS — F419 Anxiety disorder, unspecified: Secondary | ICD-10-CM | POA: Diagnosis present

## 2015-02-26 HISTORY — DX: Essential (primary) hypertension: I10

## 2015-02-26 MED ORDER — OXYCODONE-ACETAMINOPHEN 5-325 MG PO TABS
1.0000 | ORAL_TABLET | Freq: Once | ORAL | Status: AC
  Administered 2015-02-26: 1 via ORAL
  Filled 2015-02-26: qty 1

## 2015-02-26 MED ORDER — LORAZEPAM 1 MG PO TABS
1.0000 mg | ORAL_TABLET | Freq: Once | ORAL | Status: AC
  Administered 2015-02-26: 1 mg via ORAL
  Filled 2015-02-26: qty 1
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
## 2015-02-26 NOTE — ED Notes (Signed)
Discharge instructions explained to patient and all questions answered. Patient will be discharging with family via car.
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
## 2015-02-26 NOTE — ED Notes (Signed)
Patient transported to X-ray 
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
## 2015-02-26 NOTE — ED Notes (Signed)
Shirt and bra removed for xray. Gown applied

## 2015-02-27 ENCOUNTER — Encounter (INDEPENDENT_AMBULATORY_CARE_PROVIDER_SITE_OTHER): Payer: Self-pay

## 2015-02-28 ENCOUNTER — Emergency Department (HOSPITAL_COMMUNITY)
Admission: EM | Admit: 2015-02-28 | Discharge: 2015-02-28 | Disposition: A | Discharge status: Home / Self Care | Payer: BC Managed Care – PPO | Attending: Emergency Medicine | Admitting: Emergency Medicine

## 2015-02-28 ENCOUNTER — Encounter (HOSPITAL_COMMUNITY): Payer: Self-pay | Admitting: Vascular Surgery

## 2015-02-28 ENCOUNTER — Emergency Department (HOSPITAL_COMMUNITY): Payer: BC Managed Care – PPO

## 2015-02-28 DIAGNOSIS — S3992XA Unspecified injury of lower back, initial encounter: Secondary | ICD-10-CM | POA: Insufficient documentation

## 2015-02-28 DIAGNOSIS — I10 Essential (primary) hypertension: Secondary | ICD-10-CM | POA: Diagnosis not present

## 2015-02-28 DIAGNOSIS — Y9389 Activity, other specified: Secondary | ICD-10-CM | POA: Insufficient documentation

## 2015-02-28 DIAGNOSIS — Y92410 Unspecified street and highway as the place of occurrence of the external cause: Secondary | ICD-10-CM | POA: Diagnosis not present

## 2015-02-28 DIAGNOSIS — M791 Myalgia, unspecified site: Secondary | ICD-10-CM

## 2015-02-28 DIAGNOSIS — Y998 Other external cause status: Secondary | ICD-10-CM | POA: Insufficient documentation

## 2015-02-28 MED ORDER — HYDROCODONE-ACETAMINOPHEN 5-325 MG PO TABS
1.0000 | ORAL_TABLET | Freq: Once | ORAL | Status: AC
  Administered 2015-02-28: 1 via ORAL
  Filled 2015-02-28: qty 1

## 2015-02-28 MED ORDER — ONDANSETRON 4 MG PO TBDP
4.0000 mg | ORAL_TABLET | Freq: Once | ORAL | Status: AC
  Administered 2015-02-28: 4 mg via ORAL
  Filled 2015-02-28: qty 1

## 2015-02-28 MED ORDER — TRAMADOL HCL 50 MG PO TABS
50.0000 mg | ORAL_TABLET | Freq: Four times a day (QID) | ORAL | Status: DC | PRN
Start: 2015-02-28 — End: 2015-07-07

## 2015-02-28 MED ORDER — LISINOPRIL 10 MG PO TABS: 10.0000 mg | ORAL_TABLET | Freq: Every day | ORAL | Status: DC
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
## 2015-07-07 ENCOUNTER — Emergency Department (HOSPITAL_COMMUNITY)
Admission: EM | Admit: 2015-07-07 | Discharge: 2015-07-08 | Disposition: A | Discharge status: Home / Self Care | Payer: BC Managed Care – PPO | Attending: Emergency Medicine | Admitting: Emergency Medicine

## 2015-07-07 ENCOUNTER — Encounter (HOSPITAL_COMMUNITY): Payer: Self-pay | Admitting: Emergency Medicine

## 2015-07-07 ENCOUNTER — Emergency Department (HOSPITAL_COMMUNITY): Payer: BC Managed Care – PPO

## 2015-07-07 DIAGNOSIS — I10 Essential (primary) hypertension: Secondary | ICD-10-CM | POA: Insufficient documentation

## 2015-07-07 DIAGNOSIS — R1013 Epigastric pain: Secondary | ICD-10-CM | POA: Insufficient documentation

## 2015-07-07 DIAGNOSIS — Z3202 Encounter for pregnancy test, result negative: Secondary | ICD-10-CM | POA: Diagnosis not present

## 2015-07-07 DIAGNOSIS — R079 Chest pain, unspecified: Secondary | ICD-10-CM | POA: Diagnosis not present

## 2015-07-07 DIAGNOSIS — N938 Other specified abnormal uterine and vaginal bleeding: Secondary | ICD-10-CM | POA: Diagnosis not present

## 2015-07-07 DIAGNOSIS — R109 Unspecified abdominal pain: Secondary | ICD-10-CM | POA: Diagnosis present

## 2015-07-07 LAB — CBC WITH DIFFERENTIAL/PLATELET
Basophils Absolute: 0 10*3/uL (ref 0.0–0.1)
Basophils Relative: 0 % (ref 0–1)
EOS PCT: 3 % (ref 0–5)
Eosinophils Absolute: 0.1 10*3/uL (ref 0.0–0.7)
HCT: 37.2 % (ref 36.0–46.0)
HEMOGLOBIN: 12.3 g/dL (ref 12.0–15.0)
LYMPHS ABS: 2.2 10*3/uL (ref 0.7–4.0)
Lymphocytes Relative: 38 % (ref 12–46)
MCH: 27.8 pg (ref 26.0–34.0)
MCHC: 33.1 g/dL (ref 30.0–36.0)
MCV: 84.2 fL (ref 78.0–100.0)
MONO ABS: 0.3 10*3/uL (ref 0.1–1.0)
Monocytes Relative: 5 % (ref 3–12)
NEUTROS PCT: 54 % (ref 43–77)
Neutro Abs: 3.1 10*3/uL (ref 1.7–7.7)
Platelets: 188 10*3/uL (ref 150–400)
RBC: 4.42 MIL/uL (ref 3.87–5.11)
RDW: 13.8 % (ref 11.5–15.5)
WBC: 5.7 10*3/uL (ref 4.0–10.5)

## 2015-07-07 LAB — COMPREHENSIVE METABOLIC PANEL
ALT: 34 U/L (ref 14–54)
AST: 66 U/L — ABNORMAL HIGH (ref 15–41)
Albumin: 3.5 g/dL (ref 3.5–5.0)
Alkaline Phosphatase: 89 U/L (ref 38–126)
Anion gap: 9 (ref 5–15)
BILIRUBIN TOTAL: 0.6 mg/dL (ref 0.3–1.2)
BUN: 8 mg/dL (ref 6–20)
CALCIUM: 8.6 mg/dL — AB (ref 8.9–10.3)
CO2: 23 mmol/L (ref 22–32)
Chloride: 106 mmol/L (ref 101–111)
Creatinine, Ser: 0.79 mg/dL (ref 0.44–1.00)
GFR calc Af Amer: >60 mL/min (ref >60)
GFR calc non Af Amer: >60 mL/min (ref >60)
GLUCOSE: 95 mg/dL (ref 65–99)
POTASSIUM: 3.5 mmol/L (ref 3.5–5.1)
Sodium: 138 mmol/L (ref 135–145)
Total Protein: 7.8 g/dL (ref 6.5–8.1)

## 2015-07-07 LAB — URINALYSIS, ROUTINE W REFLEX MICROSCOPIC
Bilirubin Urine: NEGATIVE
Glucose, UA: NEGATIVE mg/dL
Ketones, ur: NEGATIVE mg/dL
Leukocytes, UA: NEGATIVE
Nitrite: NEGATIVE
PH: 7.5 (ref 5.0–8.0)
PROTEIN: NEGATIVE mg/dL
Specific Gravity, Urine: 1.013 (ref 1.005–1.030)
UROBILINOGEN UA: 1 mg/dL (ref 0.0–1.0)

## 2015-07-07 LAB — URINE MICROSCOPIC-ADD ON

## 2015-07-07 LAB — POC URINE PREG, ED: Preg Test, Ur: NEGATIVE

## 2015-07-07 LAB — LIPASE, BLOOD: Lipase: 15 U/L — ABNORMAL LOW (ref 22–51)

## 2015-07-07 MED ORDER — GI COCKTAIL ~~LOC~~
30.0000 mL | Freq: Once | ORAL | Status: AC
  Administered 2015-07-07: 30 mL via ORAL
  Filled 2015-07-07: qty 30

## 2015-07-07 NOTE — ED Notes (Signed)
Patient transported to MRI 

## 2015-07-07 NOTE — ED Notes (Signed)
Per EMS: pt sts 1505 c/o upper abd pain worse with movement, palpation and inspiration through to back; pt with abnormal vaginal bleeding; did not take htn meds today; pt given  ASA; pt given nitro without relief; 22g L hand

## 2015-07-07 NOTE — ED Provider Notes (Signed)
CSN: [id number]     Arrival date & time 07/07/15  1630 History   First MD Initiated Contact with Patient 07/07/15 2121     Chief Complaint  Patient presents with  . Abdominal Pain     (Consider location/radiation/quality/duration/timing/severity/associated sxs/prior Treatment) HPI Margaret Singleton is a 44 y.o. female with history of hypertension, presents to emergency department complaining of abdominal pain. Patient states pain is in the epigastric area radiates across the entire upper abdomen into the back and into her chest. Pain is worse with deep breathing, movement, palpation of the abdomen. Patient called EMS because of the pain, she was given aspirin and nitroglycerin without relief. She denies any prior cardiac history. She states she feels nauseated, but denies vomiting. She denies shortness of breath, but states is just painful to breathe. She denies similar pain in the past. She denies any pain that is worsened with eating in the past. She denies problems with bowel movements. She denies any urinary symptoms. She states that she has had vaginal bleeding for 2 weeks, which is unusual for her. Denies dizziness or lightheadedness.   Past Medical History  Diagnosis Date  . Hypertension    Past Surgical History  Procedure Laterality Date  . Laparoscopic appendectomy N/A 02/20/2014    Procedure: APPENDECTOMY LAPAROSCOPIC;  Surgeon: Liz Malady, MD;  Location: [facility] OR;  Service: General;  Laterality: N/A;   History reviewed. No pertinent family history. History  Substance Use Topics  . Smoking status: Never Smoker   . Smokeless tobacco: Not on file  . Alcohol Use: No   OB History    Gravida Para Term Preterm AB TAB SAB Ectopic Multiple Living   0 0 0 0 0 0 0 0       Review of Systems  Constitutional: Negative for fever and chills.  Respiratory: Positive for chest tightness. Negative for cough and shortness of breath.   Cardiovascular: Positive for chest pain. Negative for  palpitations and leg swelling.  Gastrointestinal: Positive for nausea and abdominal pain. Negative for vomiting and diarrhea.  Genitourinary: Positive for vaginal bleeding. Negative for dysuria, flank pain, vaginal discharge, vaginal pain and pelvic pain.  Musculoskeletal: Negative for myalgias, arthralgias, neck pain and neck stiffness.  Skin: Negative for rash.  Neurological: Negative for dizziness, weakness and headaches.  All other systems reviewed and are negative.     Allergies  Oxycodone  Home Medications   Prior to Admission medications   Medication Sig Start Date End Date Taking? Authorizing Provider  lisinopril (PRINIVIL,ZESTRIL) 10 MG tablet Take 1 tablet (10 mg total) by mouth daily. 02/28/15  Yes Tiffany Neva Seat, PA-C  naproxen sodium (ANAPROX) 220 MG tablet Take 220 mg by mouth daily as needed (body aches).   Yes Historical Provider, MD   BP 136/83 mmHg  Pulse 57  Temp(Src) 98.1 F (36.7 C) (Oral)  Resp 14  SpO2 100% Physical Exam  Constitutional: She is oriented to person, place, and time. She appears well-developed and well-nourished. No distress.  HENT:  Head: Normocephalic.  Eyes: Conjunctivae are normal.  Neck: Neck supple.  Cardiovascular: Normal rate, regular rhythm and normal heart sounds.   Pulmonary/Chest: Effort normal and breath sounds normal. No respiratory distress. She has no wheezes. She has no rales. She exhibits tenderness.  Sternal tenderness  Abdominal: Soft. Bowel sounds are normal. She exhibits no distension. There is tenderness. There is no rebound and no guarding.  RUQ, epigastric, LUQ tenderness  Musculoskeletal: She exhibits no edema.  Neurological: She is  alert and oriented to person, place, and time.  Skin: Skin is warm and dry.  Psychiatric: She has a normal mood and affect. Her behavior is normal.  Nursing note and vitals reviewed.   ED Course  Procedures (including critical care time) Labs Review Labs Reviewed  LIPASE, BLOOD -  Abnormal; Notable for the following:    Lipase 15 (*)    All other components within normal limits  COMPREHENSIVE METABOLIC PANEL - Abnormal; Notable for the following:    Calcium 8.6 (*)    AST 66 (*)    All other components within normal limits  URINALYSIS, ROUTINE W REFLEX MICROSCOPIC (NOT AT [facility]) - Abnormal; Notable for the following:    Color, Urine RED (*)    APPearance CLOUDY (*)    Hgb urine dipstick LARGE (*)    All other components within normal limits  URINE MICROSCOPIC-ADD ON - Abnormal; Notable for the following:    Bacteria, UA FEW (*)    All other components within normal limits  CBC WITH DIFFERENTIAL/PLATELET  POC URINE PREG, ED    Imaging Review US Abdomen Complete  07/07/2015   CLINICAL DATA:  Abdominal pain  EXAM: ULTRASOUND ABDOMEN COMPLETE  COMPARISON:  None.  FINDINGS: Gallbladder: No gallstones or wall thickening visualized. No sonographic Murphy sign noted.  Common bile duct: Diameter: 3 mm  Liver: No focal lesion identified. Within normal limits in parenchymal echogenicity.  IVC: No abnormality visualized.  Pancreas: Visualized portion unremarkable.  Spleen: Size and appearance within normal limits.  Right Kidney: Length: 10.9 cm. Echogenicity within normal limits. No mass or hydronephrosis visualized.  Left Kidney: Length: 11.3 cm. Echogenicity within normal limits. No mass or hydronephrosis visualized.  Abdominal aorta: No aneurysm visualized.  Other findings: None.  IMPRESSION: Normal abdominal ultrasound.   Electronically Signed   By: Elige Ko   On: 07/07/2015 22:50     EKG Interpretation None      MDM   Final diagnoses:  Epigastric pain   9:56 PM   patient seen and examined. Patient with upper abdominal pain. Labs obtained by triage and have resulted, with no significant abnormalities. Patient does have significant pain and tenderness in the right upper quadrant epigastric area, will get ultrasound for further evaluation. Will try GI cocktail. Pain  is atypical for coronary artery disease. It is nonexertional, it has been constant for the last 7 hours, doubt ACS.   1:12 AM Patient's lab work, ultrasound abdomen, urinalysis all unremarkable. Patient does have large hemoglobin to numerous to count red blood cells in urine, but states she is currently on her period. No elevation will blood cell count, doubt serious infection this time. She is afebrile. Nontoxic appearing. I did tell to troponin, which is negative. Patient's epigastric pain did improve with GI cocktail. Plan to discharge home, start on protonic, carefully, follow with primary care doctor. Return precautions discussed.  Filed Vitals:   07/07/15 2300 07/07/15 2330 07/08/15 0000 07/08/15 0100  BP: 115/77 112/68 118/69 100/61  Pulse: 74 71 72 64  Temp:      TempSrc:      Resp: 20 16 13 11   SpO2: 98% 96% 96% 96%      Jaynie Crumble, PA-C 07/08/15 0113  Mancel Bale, MD 07/09/15 1404

## 2015-07-07 NOTE — ED Notes (Signed)
Patient transported to Ultrasound 

## 2015-07-08 LAB — I-STAT TROPONIN, ED: Troponin i, poc: 0 ng/mL (ref 0.00–0.08)

## 2015-07-08 MED ORDER — PANTOPRAZOLE SODIUM 20 MG PO TBEC: 20.0000 mg | DELAYED_RELEASE_TABLET | Freq: Every day | ORAL | Status: DC

## 2015-07-08 MED ORDER — SUCRALFATE 1 G PO TABS: 1.0000 g | ORAL_TABLET | Freq: Three times a day (TID) | ORAL | Status: DC

## 2015-07-08 NOTE — Discharge Instructions (Signed)
Take Carafate as prescribed as needed for abdominal pain. Take Protonix daily. Please follow-up with her primary care doctor regarding her abdominal pain and for recheck of your vaginal bleeding. Return if worsening symptoms.  Heartburn Heartburn is a painful, burning sensation in the chest. It may feel worse in certain positions, such as lying down or bending over. It is caused by stomach acid backing up into the tube that carries food from the mouth down to the stomach (lower esophagus).  CAUSES   Large meals.  Certain foods and drinks.  Exercise.  Increased acid production.  Being overweight or obese.  Certain medicines. SYMPTOMS   Burning pain in the chest or lower throat.  Bitter taste in the mouth.  Coughing. DIAGNOSIS  If the usual treatments for heartburn do not improve your symptoms, then tests may be done to see if there is another condition present. Possible tests may include:  X-rays.  Endoscopy. This is when a tube with a light and a camera on the end is used to examine the esophagus and the stomach.  A test to measure the amount of acid in the esophagus (pH test).  A test to see if the esophagus is working properly (esophageal manometry).  Blood, breath, or stool tests to check for bacteria that cause ulcers. TREATMENT   Your caregiver may tell you to use certain over-the-counter medicines (antacids, acid reducers) for mild heartburn.  Your caregiver may prescribe medicines to decrease the acid in your stomach or protect your stomach lining.  Your caregiver may recommend certain diet changes.  For severe cases, your caregiver may recommend that the head of your bed be elevated on blocks. (Sleeping with more pillows is not an effective treatment as it only changes the position of your head and does not improve the main problem of stomach acid refluxing into the esophagus.) HOME CARE INSTRUCTIONS   Take all medicines as directed by your caregiver.  Raise  the head of your bed by putting blocks under the legs if instructed to by your caregiver.  Do not exercise right after eating.  Avoid eating 2 or 3 hours before bed. Do not lie down right after eating.  Eat small meals throughout the day instead of 3 large meals.  Stop smoking if you smoke.  Maintain a healthy weight.  Identify foods and beverages that make your symptoms worse and avoid them. Foods you may want to avoid include:  Peppers.  Chocolate.  High-fat foods, including fried foods.  Spicy foods.  Garlic and onions.  Citrus fruits, including oranges, grapefruit, lemons, and limes.  Food containing tomatoes or tomato products.  Mint.  Carbonated drinks, caffeinated drinks, and alcohol.  Vinegar. SEEK IMMEDIATE MEDICAL CARE IF:  You have severe chest pain that goes down your arm or into your jaw or neck.  You feel sweaty, dizzy, or lightheaded.  You are short of breath.  You vomit blood.  You have difficulty or pain with swallowing.  You have bloody or black, tarry stools.  You have episodes of heartburn more than 3 times a week for more than 2 weeks. MAKE SURE YOU:  Understand these instructions.  Will watch your condition.  Will get help right away if you are not doing well or get worse. Document Released: 04/03/2009 Document Revised: 02/07/2012 Document Reviewed: 05/02/2011 [facility] Patient Information 2015 [place], [place]. This information is not intended to replace advice given to you by your health care provider. Make sure you discuss any questions you have with  your health care provider. ° °

## 2015-07-08 NOTE — ED Notes (Signed)
PA at bedside.

## 2017-02-27 DIAGNOSIS — Z8719 Personal history of other diseases of the digestive system: Secondary | ICD-10-CM

## 2017-02-27 HISTORY — DX: Personal history of other diseases of the digestive system: Z87.19

## 2017-03-10 ENCOUNTER — Encounter (HOSPITAL_COMMUNITY): Payer: Self-pay | Admitting: Emergency Medicine

## 2017-03-10 ENCOUNTER — Ambulatory Visit (HOSPITAL_COMMUNITY)
Admission: EM | Admit: 2017-03-10 | Discharge: 2017-03-10 | Disposition: A | Discharge status: Home / Self Care | Payer: BC Managed Care – PPO | Attending: Family Medicine | Admitting: Family Medicine

## 2017-03-10 DIAGNOSIS — Z3202 Encounter for pregnancy test, result negative: Secondary | ICD-10-CM

## 2017-03-10 DIAGNOSIS — K5732 Diverticulitis of large intestine without perforation or abscess without bleeding: Secondary | ICD-10-CM | POA: Diagnosis not present

## 2017-03-10 DIAGNOSIS — R1030 Lower abdominal pain, unspecified: Secondary | ICD-10-CM | POA: Diagnosis not present

## 2017-03-10 DIAGNOSIS — R1110 Vomiting, unspecified: Secondary | ICD-10-CM

## 2017-03-10 LAB — POCT URINALYSIS DIP (DEVICE)
Bilirubin Urine: NEGATIVE
GLUCOSE, UA: NEGATIVE mg/dL
Hgb urine dipstick: NEGATIVE
Ketones, ur: NEGATIVE mg/dL
Leukocytes, UA: NEGATIVE
Nitrite: NEGATIVE
PH: 6.5 (ref 5.0–8.0)
Protein, ur: NEGATIVE mg/dL
Specific Gravity, Urine: 1.025 (ref 1.005–1.030)
UROBILINOGEN UA: 0.2 mg/dL (ref 0.0–1.0)

## 2017-03-10 LAB — POCT PREGNANCY, URINE: PREG TEST UR: NEGATIVE

## 2017-03-10 MED ORDER — ONDANSETRON 8 MG PO TBDP: 8.0000 mg | ORAL_TABLET | Freq: Three times a day (TID) | ORAL | 0 refills | Status: DC | PRN

## 2017-03-10 MED ORDER — LISINOPRIL 10 MG PO TABS: 10.0000 mg | ORAL_TABLET | Freq: Every day | ORAL | 3 refills | Status: DC

## 2017-03-10 MED ORDER — AMOXICILLIN-POT CLAVULANATE 875-125 MG PO TABS: 1.0000 | ORAL_TABLET | Freq: Two times a day (BID) | ORAL | 0 refills | Status: DC

## 2017-03-10 NOTE — ED Triage Notes (Signed)
Pt complains of lower abdominal pain that radiates to her epigastric area that has been going on for three days.  She also reports a few episodes of vomiting.

## 2017-03-10 NOTE — ED Provider Notes (Signed)
[facility]    CSN: [id number] Arrival date & time: 03/10/17  4098     History   Chief Complaint Chief Complaint  Patient presents with  . Abdominal Pain  . Emesis    HPI [place] is a 46 y.o. female.   This a 46 year old woman who is originally from [place], [place]. She presents with 48 hours of lower abdominal pain, worse on the left which radiates up to the epigastrium. She's had some nausea and vomiting but no diarrhea. She denies any urinary frequency or burning. She says that she might have had a fever but she doesn't know for sure.  Patient's had an upper respiratory symptoms of symptoms with cough and some chills over the last week.  Patient has a history of appendectomy. She was seen in the emergency room for GERD in the past 2 years. She is not taking any blood pressure medicine as she says her doctor is on vacation and she's run out of medication.      Past Medical History:  Diagnosis Date  . Hypertension     Patient Active Problem List   Diagnosis Date Noted  . Cholelithiasis 02/21/2014  . Acute appendicitis 02/20/2014    Past Surgical History:  Procedure Laterality Date  . LAPAROSCOPIC APPENDECTOMY N/A 02/20/2014   Procedure: APPENDECTOMY LAPAROSCOPIC;  Surgeon: Liz Malady, MD;  Location: [facility] OR;  Service: General;  Laterality: N/A;    OB History    Gravida Para Term Preterm AB Living   0 0 0 0 0     SAB TAB Ectopic Multiple Live Births   0 0 0           Home Medications    Prior to Admission medications   Medication Sig Start Date End Date Taking? Authorizing Provider  amoxicillin-clavulanate (AUGMENTIN) 875-125 MG tablet Take 1 tablet by mouth every 12 (twelve) hours. 03/10/17   Elvina Sidle, MD  lisinopril (PRINIVIL,ZESTRIL) 10 MG tablet Take 1 tablet (10 mg total) by mouth daily. 03/10/17   Elvina Sidle, MD  ondansetron (ZOFRAN-ODT) 8 MG disintegrating tablet Take 1 tablet (8 mg total) by mouth every 8 (eight)  hours as needed for nausea. 03/10/17   Elvina Sidle, MD    Family History History reviewed. No pertinent family history.  Social History Social History  Substance Use Topics  . Smoking status: Never Smoker  . Smokeless tobacco: Never Used  . Alcohol use No     Allergies   Oxycodone   Review of Systems Review of Systems  Constitutional: Positive for chills.  HENT: Positive for congestion.   Respiratory: Positive for cough.   Gastrointestinal: Positive for abdominal pain, nausea and vomiting. Negative for diarrhea.  Genitourinary: Negative.   Musculoskeletal: Negative.   Neurological: Negative.      Physical Exam Triage Vital Signs ED Triage Vitals  Enc Vitals Group     BP      Pulse      Resp      Temp      Temp src      SpO2      Weight      Height      Head Circumference      Peak Flow      Pain Score      Pain Loc      Pain Edu?      Excl. in GC?    No data found.   Updated Vital Signs BP (!) 151/94 (BP Location: Right  Arm)   Pulse 77   Temp 98.4 F (36.9 C) (Oral)   LMP 12/10/2016 (Approximate)   SpO2 97%    Physical Exam  Constitutional: She is oriented to person, place, and time. She appears well-developed and well-nourished.  Patient's accent makes her difficult to understand and she requires repeating questions because she doesn't understand exactly what we are asking.  HENT:  Right Ear: External ear normal.  Left Ear: External ear normal.  Mouth/Throat: Oropharynx is clear and moist.  Eyes: Conjunctivae and EOM are normal. Pupils are equal, round, and reactive to light.  Neck: Normal range of motion. Neck supple.  Cardiovascular: Normal rate, regular rhythm and normal heart sounds.   Pulmonary/Chest: Effort normal and breath sounds normal.  Abdominal: Soft. Bowel sounds are normal. There is tenderness. There is guarding. There is no rebound.  Patient is tender in her left lower quadrant  Musculoskeletal: Normal range of motion.    Neurological: She is alert and oriented to person, place, and time.  Skin: Skin is warm and dry.  Nursing note and vitals reviewed.    UC Treatments / Results  Labs (all labs ordered are listed, but only abnormal results are displayed) Labs Reviewed  POCT URINALYSIS DIP (DEVICE)  POCT PREGNANCY, URINE    EKG  EKG Interpretation None       Radiology No results found.  Procedures Procedures (including critical care time)  Medications Ordered in UC Medications - No data to display   Initial Impression / Assessment and Plan / UC Course  I have reviewed the triage vital signs and the nursing notes.  Pertinent labs & imaging results that were available during my care of the patient were reviewed by me and considered in my medical decision making (see chart for details).     Final Clinical Impressions(s) / UC Diagnoses   Final diagnoses:  Diverticulitis of large intestine without bleeding, unspecified complication status    New Prescriptions New Prescriptions   AMOXICILLIN-CLAVULANATE (AUGMENTIN) 875-125 MG TABLET    Take 1 tablet by mouth every 12 (twelve) hours.   ONDANSETRON (ZOFRAN-ODT) 8 MG DISINTEGRATING TABLET    Take 1 tablet (8 mg total) by mouth every 8 (eight) hours as needed for nausea.     Elvina Sidle, MD 03/10/17 1034

## 2018-01-11 ENCOUNTER — Encounter (HOSPITAL_COMMUNITY): Payer: Self-pay

## 2018-01-11 ENCOUNTER — Other Ambulatory Visit: Payer: Self-pay

## 2018-01-11 ENCOUNTER — Emergency Department (HOSPITAL_COMMUNITY): Payer: BC Managed Care – PPO

## 2018-01-11 ENCOUNTER — Emergency Department (HOSPITAL_COMMUNITY)
Admission: EM | Admit: 2018-01-11 | Discharge: 2018-01-11 | Disposition: A | Discharge status: Home / Self Care | Payer: BC Managed Care – PPO | Attending: Emergency Medicine | Admitting: Emergency Medicine

## 2018-01-11 DIAGNOSIS — R0789 Other chest pain: Secondary | ICD-10-CM | POA: Diagnosis not present

## 2018-01-11 DIAGNOSIS — R079 Chest pain, unspecified: Secondary | ICD-10-CM | POA: Diagnosis present

## 2018-01-11 DIAGNOSIS — I10 Essential (primary) hypertension: Secondary | ICD-10-CM | POA: Diagnosis not present

## 2018-01-11 DIAGNOSIS — Z79899 Other long term (current) drug therapy: Secondary | ICD-10-CM | POA: Insufficient documentation

## 2018-01-11 LAB — CBC
HCT: 39.4 % (ref 36.0–46.0)
HEMOGLOBIN: 12.9 g/dL (ref 12.0–15.0)
MCH: 28.2 pg (ref 26.0–34.0)
MCHC: 32.7 g/dL (ref 30.0–36.0)
MCV: 86 fL (ref 78.0–100.0)
Platelets: 218 10*3/uL (ref 150–400)
RBC: 4.58 MIL/uL (ref 3.87–5.11)
RDW: 13.8 % (ref 11.5–15.5)
WBC: 5.6 10*3/uL (ref 4.0–10.5)

## 2018-01-11 LAB — COMPREHENSIVE METABOLIC PANEL
ALK PHOS: 85 U/L (ref 38–126)
ALT: 16 U/L (ref 14–54)
AST: 20 U/L (ref 15–41)
Albumin: 3.6 g/dL (ref 3.5–5.0)
Anion gap: 11 (ref 5–15)
BILIRUBIN TOTAL: 0.8 mg/dL (ref 0.3–1.2)
BUN: 12 mg/dL (ref 6–20)
CALCIUM: 9.2 mg/dL (ref 8.9–10.3)
CO2: 21 mmol/L — ABNORMAL LOW (ref 22–32)
Chloride: 108 mmol/L (ref 101–111)
Creatinine, Ser: 0.79 mg/dL (ref 0.44–1.00)
GFR calc Af Amer: >60 mL/min (ref >60)
GFR calc non Af Amer: >60 mL/min (ref >60)
Glucose, Bld: 84 mg/dL (ref 65–99)
POTASSIUM: 3.8 mmol/L (ref 3.5–5.1)
SODIUM: 140 mmol/L (ref 135–145)
TOTAL PROTEIN: 7.7 g/dL (ref 6.5–8.1)

## 2018-01-11 LAB — I-STAT TROPONIN, ED: Troponin i, poc: 0 ng/mL (ref 0.00–0.08)

## 2018-01-11 LAB — D-DIMER, QUANTITATIVE (NOT AT ARMC): D-Dimer, Quant: 0.5 ug/mL-FEU (ref 0.00–0.50)

## 2018-01-11 MED ORDER — CYCLOBENZAPRINE HCL 10 MG PO TABS: 10.0000 mg | ORAL_TABLET | Freq: Two times a day (BID) | ORAL | 0 refills | Status: DC | PRN

## 2018-01-11 NOTE — ED Notes (Signed)
Pt verbalized understanding of discharge instructions and denies any further questions at this time.   

## 2018-01-11 NOTE — ED Notes (Signed)
ekg completed at triage

## 2018-01-11 NOTE — ED Notes (Signed)
Pt refuses to take deep breaths she reports that's when the pain comes. A/o in no acute distress.

## 2018-01-11 NOTE — Discharge Instructions (Signed)
Please read instructions below.  Follow up with your primary care provider regarding your symptoms.  You can take advil or tylenol as needed for pain. Avoid heavy lifting for the next few days. Return to the ER for new or worsening symptoms; including worsening chest pain, shortness of breath, pain that radiates to the arm or neck, pain or shortness of breath worsened with exertion.

## 2018-01-11 NOTE — ED Triage Notes (Signed)
Pt presents to the ed with complaints of chest pain radiating to her back x 1 week with cough present. Complaints of worsening pain with a deep breath. Dr. Denton LankSteinl at bedside.

## 2018-01-11 NOTE — ED Notes (Signed)
ED Provider at bedside. 

## 2018-01-11 NOTE — ED Provider Notes (Signed)
[facility] EMERGENCY DEPARTMENT Provider Note   CSN: [id number] Arrival date & time: 01/11/18  1249     History   Chief Complaint Chief Complaint  Patient presents with  . Chest Pain    HPI [place] is a 47 y.o. female history of hypertension, presenting to the ED for 1 week of sternal chest pain.  Patient states pain has been worsening, and is aggravated by palpation and movement as well as deep breathing.  She states pain radiates to her back and feels as though is along her spine.  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 She states she is just getting over a cold and has a lingering cough, though symptoms are improving. She denies fever, nausea, diaphoresis, or other complaints. No cardiac hx.    The history is provided by the patient.    Past Medical History:  Diagnosis Date  . Hypertension     Patient Active Problem List   Diagnosis Date Noted  . Cholelithiasis 02/21/2014  . Acute appendicitis 02/20/2014    Past Surgical History:  Procedure Laterality Date  . LAPAROSCOPIC APPENDECTOMY N/A 02/20/2014   Procedure: APPENDECTOMY LAPAROSCOPIC;  Surgeon: Liz Malady, MD;  Location: [facility];  Service: General;  Laterality: N/A;    OB History    Gravida Para Term Preterm AB Living   0 0 0 0 0     SAB TAB Ectopic Multiple Live Births   0 0 0           Home Medications    Prior to Admission medications   Medication Sig Start Date End Date Taking? Authorizing Provider  amoxicillin-clavulanate (AUGMENTIN) 875-125 MG tablet Take 1 tablet by mouth every 12 (twelve) hours. 03/10/17   Elvina Sidle, MD  lisinopril (PRINIVIL,ZESTRIL) 10 MG tablet Take 1 tablet (10 mg total) by mouth daily. 03/10/17   Elvina Sidle, MD  ondansetron (ZOFRAN-ODT) 8 MG disintegrating tablet Take 1 tablet (8 mg total) by mouth every 8 (eight) hours as needed for nausea. 03/10/17   Elvina Sidle, MD    Family  History No family history on file.  Social History Social History   Tobacco Use  . Smoking status: Never Smoker  . Smokeless tobacco: Never Used  Substance Use Topics  . Alcohol use: No  . Drug use: No     Allergies   Oxycodone   Review of Systems Review of Systems  Constitutional: Negative for diaphoresis and fever.  Respiratory: Negative for shortness of breath.   Cardiovascular: Positive for chest pain. Negative for palpitations and leg swelling.  Gastrointestinal: Negative for nausea.  Musculoskeletal: Positive for back pain.  All other systems reviewed and are negative.    Physical Exam Updated Vital Signs BP 126/75 (BP Location: Right Arm)   Pulse 65   Temp 98 F (36.7 C)   Resp (!) 23   Wt 81.6 kg (180 lb)   LMP 12/10/2016 (Approximate)   SpO2 100%   BMI 38.95 kg/m   Physical Exam  Constitutional: She appears well-developed and well-nourished. No distress.  HENT:  Head: Normocephalic and atraumatic.  Eyes: Conjunctivae are normal.  Neck: Normal range of motion. Neck supple.  Cardiovascular: Normal rate, regular rhythm, normal heart sounds and intact distal pulses. Exam reveals no gallop and no friction rub.  No murmur heard. Pulmonary/Chest: Effort normal and breath sounds normal. No stridor. No respiratory distress. She has no wheezes. She has no rales.  Significant chest wall tenderness to lower sternum and along lower border of anterior ribs bilaterally.  Abdominal: Soft. Bowel sounds are normal. She exhibits no distension. There is no tenderness. There is no guarding.  Musculoskeletal:  No LE edema or tenderness  Neurological: She is alert.  Skin: Skin is warm.  Psychiatric: She has a normal mood and affect. Her behavior is normal.  Nursing note and vitals reviewed.    ED Treatments / Results  Labs (all labs ordered are listed, but only abnormal results are displayed) Labs Reviewed  COMPREHENSIVE METABOLIC PANEL - Abnormal; Notable for the  following components:      Result Value   CO2 21 (*)    All other components within normal limits  D-DIMER, QUANTITATIVE (NOT AT [facility])  CBC  I-STAT TROPONIN, ED    EKG  EKG Interpretation  Date/Time:  Wednesday January 11 2018 12:56:26 EST Ventricular Rate:  73 PR Interval:  162 QRS Duration: 80 QT Interval:  378 QTC Calculation: 416 R Axis:   23 Text Interpretation:  Normal sinus rhythm Possible Anterior infarct , age undetermined Abnormal ECG No significant change since last tracing Confirmed by Jerelyn ScottLinker, Martha [phone]([id number]) on 01/11/2018 7:05:58 PM       Radiology Dg Chest 2 View  Result Date: 01/11/2018 CLINICAL DATA:  Chest pain. EXAM: CHEST  2 VIEW COMPARISON:  Chest x-ray dated 12/11/2010 and CT scan abdomen dated 02/20/2014 FINDINGS: Mild cardiomegaly. Pulmonary vascularity is normal. Slight accentuation of the interstitial markings at lung bases with peribronchial thickening. No effusions. No consolidative infiltrates. No bone abnormality. IMPRESSION: Chronic changes at the lung bases.  New mild cardiomegaly. Electronically Signed   By: Francene BoyersJames  Maxwell M.D.   On: 01/11/2018 14:01    Procedures Procedures (including critical care time)  Medications Ordered in ED Medications - No data to display   Initial Impression / Assessment and Plan / ED Course  I have reviewed the triage vital signs and the nursing notes.  Pertinent labs & imaging results that were available during my care of the patient were reviewed by me and considered in my medical decision making (see chart for details).     Pt presenting with chest wall pain x 1 week. Chest pain reproducible with light palpation on exam. Chest pain is not likely of cardiac or pulmonary etiology d/t presentation, neg d-dimer, PERC neg, VSS, no tracheal deviation, no JVD or new murmur, RRR, breath sounds equal bilaterally, EKG without acute abnormalities, negative troponin, and negative CXR. Patient is to be discharged with  recommendation to follow up with PCP in regards to today's hospital visit. Discussed symptomatic management. Pt has been advised to return to the ED if CP becomes exertional, associated with diaphoresis or nausea, radiates to left jaw/arm, worsens or becomes concerning in any way. Pt appears reliable for follow up and is agreeable to discharge.   Patient discussed with Dr. Phineas RealMabe, who agrees with care plan.  Discussed results, findings, treatment and follow up. Patient advised of return precautions. Patient verbalized understanding and agreed with plan.  Final Clinical Impressions(s) / ED Diagnoses   Final diagnoses:  Chest wall pain    ED Discharge Orders    None       Robinson, [place] N, PA-C 01/11/18 2043    Phillis HaggisMabe, Martha L, MD 01/11/18 2100

## 2018-10-10 ENCOUNTER — Other Ambulatory Visit: Payer: Self-pay | Admitting: Nurse Practitioner

## 2018-11-10 ENCOUNTER — Encounter: Payer: Self-pay | Admitting: Nurse Practitioner

## 2018-12-06 ENCOUNTER — Other Ambulatory Visit: Payer: Self-pay | Admitting: Nurse Practitioner

## 2019-01-17 ENCOUNTER — Other Ambulatory Visit: Payer: Self-pay | Admitting: Nurse Practitioner

## 2019-02-12 ENCOUNTER — Other Ambulatory Visit: Payer: Self-pay

## 2019-02-12 ENCOUNTER — Ambulatory Visit: Payer: BC Managed Care – PPO | Admitting: Nurse Practitioner

## 2019-02-12 ENCOUNTER — Encounter: Payer: Self-pay | Admitting: Nurse Practitioner

## 2019-02-12 VITALS — BP 132/80 | HR 82 | Temp 98.4°F | Ht <= 58 in | Wt 186.0 lb

## 2019-02-12 DIAGNOSIS — Z9114 Patient's other noncompliance with medication regimen: Secondary | ICD-10-CM | POA: Diagnosis not present

## 2019-02-12 DIAGNOSIS — M25472 Effusion, left ankle: Secondary | ICD-10-CM

## 2019-02-12 DIAGNOSIS — M25471 Effusion, right ankle: Secondary | ICD-10-CM | POA: Insufficient documentation

## 2019-02-12 DIAGNOSIS — I10 Essential (primary) hypertension: Secondary | ICD-10-CM | POA: Diagnosis not present

## 2019-02-12 HISTORY — DX: Effusion, left ankle: M25.472

## 2019-02-12 MED ORDER — HYDROCHLOROTHIAZIDE 12.5 MG PO TABS: 12.5000 mg | ORAL_TABLET | Freq: Every day | ORAL | 1 refills | Status: DC

## 2019-02-12 NOTE — Progress Notes (Signed)
  Subjective:     Patient ID: [place] , female    DOB: 13-Oct-1971 , 48 y.o.   MRN: [id number]   Chief Complaint  Patient presents with  . Hypertension    HPI  She has not had any medication since last month, came back from [place] in January  Hypertension  This is a chronic problem. The current episode started more than 1 year ago. The problem has been gradually worsening since onset. The problem is controlled. Pertinent negatives include no blurred vision, chest pain, headaches or palpitations. Risk factors for coronary artery disease include obesity and sedentary lifestyle. Past treatments include ACE inhibitors. Compliance problems: out of medications for one month.  There is no history of angina. There is no history of chronic renal disease.     Past Medical History:  Diagnosis Date  . Hypertension      Family History  Problem Relation Age of Onset  . Healthy Mother   . Healthy Father      Current Outpatient Medications:  .  lisinopril (PRINIVIL,ZESTRIL) 5 MG tablet, Take 1 tablet (5 mg total) by mouth daily. Patient needs an appointment, Disp: 30 tablet, Rfl: 0 .  naproxen sodium (ALEVE) 220 MG tablet, Take 220 mg by mouth as needed., Disp: , Rfl:    Allergies  Allergen Reactions  . Oxycodone Itching     Review of Systems  Constitutional: Negative for fatigue.  Eyes: Negative for blurred vision.  Respiratory: Negative for cough.   Cardiovascular: Negative for chest pain, palpitations and leg swelling.  Endocrine: Negative for polydipsia, polyphagia and polyuria.  Musculoskeletal: Negative.  Negative for arthralgias and joint swelling.       Left ankle painful when having swelling.    Skin: Negative.   Neurological: Negative for dizziness and headaches.     Today's Vitals   02/12/19 1538  BP: 132/80  Pulse: 82  Temp: 98.4 F (36.9 C)  TempSrc: Oral  Weight: 186 lb (84.4 kg)  Height: _0  (1.448 m)   Body mass index is 40.25 kg/m.   Objective:   Physical Exam Constitutional:      Appearance: Normal appearance.  Cardiovascular:     Rate and Rhythm: Normal rate and regular rhythm.     Pulses: Normal pulses.     Heart sounds: Normal heart sounds. No murmur.  Pulmonary:     Effort: Pulmonary effort is normal.     Breath sounds: Normal breath sounds.  Neurological:     Mental Status: She is alert.         Assessment And Plan:      1. Essential hypertension . B/P is fair control but good, make sure to take medications as directed she has been without for a few weeks to one month  . BMP ordered to check renal function.  . The importance of regular exercise and dietary modification was stressed to the patient.  . Stressed importance of losing ten percent of her body weight to help with B/P control.  . The weight loss would help with decreasing cardiac and cancer risk as well.  - BMP8+eGFR - hydrochlorothiazide (HYDRODIURIL) 12.5 MG tablet; Take 1 tablet (12.5 mg total) by mouth daily.  Dispense: 30 tablet; Refill: 1  2. Swelling of both ankles  Trace edema,  Works walking a lot encouraged to wear support socks avoid salt   Minette Brine, FNP

## 2019-02-13 LAB — BMP8+EGFR
BUN/Creatinine Ratio: 16 (ref 9–23)
BUN: 12 mg/dL (ref 6–24)
CALCIUM: 9.5 mg/dL (ref 8.7–10.2)
CO2: 25 mmol/L (ref 20–29)
Chloride: 102 mmol/L (ref 96–106)
Creatinine, Ser: 0.75 mg/dL (ref 0.57–1.00)
GFR, EST AFRICAN AMERICAN: 108 mL/min/{1.73_m2} (ref >59)
GFR, EST NON AFRICAN AMERICAN: 94 mL/min/{1.73_m2} (ref >59)
Glucose: 75 mg/dL (ref 65–99)
POTASSIUM: 4.1 mmol/L (ref 3.5–5.2)
Sodium: 141 mmol/L (ref 134–144)

## 2019-03-02 ENCOUNTER — Encounter: Payer: Self-pay | Admitting: Nurse Practitioner

## 2019-03-12 ENCOUNTER — Other Ambulatory Visit: Payer: Self-pay

## 2019-03-12 ENCOUNTER — Ambulatory Visit: Payer: BC Managed Care – PPO | Admitting: Nurse Practitioner

## 2019-03-12 ENCOUNTER — Encounter: Payer: Self-pay | Admitting: Nurse Practitioner

## 2019-03-12 DIAGNOSIS — Z7189 Other specified counseling: Secondary | ICD-10-CM

## 2019-03-12 DIAGNOSIS — R51 Headache: Secondary | ICD-10-CM

## 2019-03-12 DIAGNOSIS — I10 Essential (primary) hypertension: Secondary | ICD-10-CM | POA: Diagnosis not present

## 2019-03-12 DIAGNOSIS — G8929 Other chronic pain: Secondary | ICD-10-CM

## 2019-03-12 DIAGNOSIS — R609 Edema, unspecified: Secondary | ICD-10-CM

## 2019-03-12 MED ORDER — HYDROCHLOROTHIAZIDE 12.5 MG PO TABS: 12.5000 mg | ORAL_TABLET | Freq: Every day | ORAL | 1 refills | Status: DC

## 2019-03-12 NOTE — Progress Notes (Addendum)
This visit type was conducted due to national recommendations for restrictions regarding the COVID-19 Pandemic (e.g. social distancing).  This format is felt to be most appropriate for this patient at this time.  All issues noted in this document were discussed and addressed.  No physical exam was performed (except for noted visual exam findings with Video Visits).  Please refer to the patient's chart (MyChart message for video visits and phone note for telephone visits) for the patient's consent to telehealth [facility].   Subjective:     Patient ID: [place] , female    DOB: December 10, 1970 , 48 y.o.   MRN: [id number]  Virtual Visit via [email or web address] Note  I connected with [place] at home on 03/12/19 at  3:45 PM EDT by telephone and verified that I am speaking with the correct person using two identifiers.   I discussed the limitations, risks, security and privacy concerns of performing an evaluation and management service by telephone and the availability of in person appointments. I also discussed with the patient that there may be a patient responsible charge related to this service. The patient expressed understanding and agreed to proceed.  Chief Complaint  Patient presents with  . Hypertension    History of Present Illness:   Follow up swelling lower extremities - the HCTZ  She has been at home the last 3 weeks due to the COVID-19.  Since not at work she does not feel are swollen at the malleolus. No salt intake.  She feels she drinks approximately 2-3 bottles of water.      Headache   This is a chronic problem. The current episode started more than 1 year ago. The problem occurs intermittently. The problem has been waxing and waning. The pain quality is similar to prior headaches. The quality of the pain is described as aching. Pertinent negatives include no abdominal pain, blurred vision, coughing or dizziness. Exacerbated by: upon awakening. She has tried nothing for the  symptoms. There is no history of cancer.     Past Medical History:  Diagnosis Date  . Hypertension      Family History  Problem Relation Age of Onset  . Healthy Mother   . Healthy Father      Current Outpatient Medications:  .  hydrochlorothiazide (HYDRODIURIL) 12.5 MG tablet, Take 1 tablet (12.5 mg total) by mouth daily., Disp: 30 tablet, Rfl: 1 .  naproxen sodium (ALEVE) 220 MG tablet, Take 220 mg by mouth as needed., Disp: , Rfl:  .  lisinopril (PRINIVIL,ZESTRIL) 5 MG tablet, Take 1 tablet (5 mg total) by mouth daily. Patient needs an appointment (Patient not taking: Reported on 03/12/2019), Disp: 30 tablet, Rfl: 0   Allergies  Allergen Reactions  . Oxycodone Itching     Review of Systems  Constitutional: Negative.   Eyes: Negative for blurred vision.  Respiratory: Negative.  Negative for cough.   Cardiovascular: Negative.  Negative for chest pain, palpitations and leg swelling.  Gastrointestinal: Negative for abdominal pain.  Musculoskeletal: Negative.   Neurological: Negative for dizziness and headaches.     There were no vitals filed for this visit.  Observations/Objective: No acute distress.  She has "puffy" ankles near the malleolus. No JVD noted.  No shortness of breath.         Assessment and Plan: 1. Chronic nonintractable headache, unspecified headache type  She has complained with headaches mostly upon awakening  Will send for sleep study  She does report snoring. - Ambulatory referral  to Sleep Studies  2. Essential hypertension . She is doing well with HCTZ will not restart her lisinopril at this time.   . The importance of regular exercise and dietary modification was stressed to the patient.  . Stressed importance of losing ten percent of her body weight to help with B/P control.  . The weight loss would help with decreasing cardiac and cancer risk as well.  - hydrochlorothiazide (HYDRODIURIL) 12.5 MG tablet; Take 1 tablet (12.5 mg total) by mouth  daily.  Dispense: 90 tablet; Refill: 1  3. Edema, unspecified type  Puffiness noted to bilateral areas near malleolus  Encouraged to elevate her feet when sitting  Wear support socks    Follow Up Instructions:   I discussed the assessment and treatment plan with the patient. The patient was provided an opportunity to ask questions and all were answered. The patient agreed with the plan and demonstrated an understanding of the instructions.   The patient was advised to call back or seek an in-person evaluation if the symptoms worsen or if the condition fails to improve as anticipated.  COVID-19 Education: The signs and symptoms of COVID-19 were discussed with the patient and how to seek care for testing (follow up with PCP or arrange E-visit).  The importance of social distancing was discussed today.   Patient Risk:   After full review of this patients clinical status, I feel that they are at least moderate risk at this time.   I provided 19 minutes of non-face-to-face time during this encounter.   Arnette Felts, FNP

## 2019-03-31 ENCOUNTER — Encounter (HOSPITAL_COMMUNITY): Payer: Self-pay | Admitting: Emergency Medicine

## 2019-03-31 ENCOUNTER — Other Ambulatory Visit: Payer: Self-pay

## 2019-03-31 ENCOUNTER — Ambulatory Visit (HOSPITAL_COMMUNITY)
Admission: EM | Admit: 2019-03-31 | Discharge: 2019-03-31 | Disposition: A | Discharge status: Home / Self Care | Payer: BC Managed Care – PPO | Attending: Family Medicine | Admitting: Family Medicine

## 2019-03-31 DIAGNOSIS — R609 Edema, unspecified: Secondary | ICD-10-CM | POA: Insufficient documentation

## 2019-03-31 DIAGNOSIS — R1032 Left lower quadrant pain: Secondary | ICD-10-CM | POA: Insufficient documentation

## 2019-03-31 LAB — POCT URINALYSIS DIP (DEVICE)
Bilirubin Urine: NEGATIVE
Glucose, UA: NEGATIVE mg/dL
Ketones, ur: NEGATIVE mg/dL
Leukocytes,Ua: NEGATIVE
Nitrite: NEGATIVE
Protein, ur: NEGATIVE mg/dL
Specific Gravity, Urine: 1.025 (ref 1.005–1.030)
Urobilinogen, UA: 0.2 mg/dL (ref 0.0–1.0)
pH: 6 (ref 5.0–8.0)

## 2019-03-31 LAB — CBC WITH DIFFERENTIAL/PLATELET
Abs Immature Granulocytes: 0.01 10*3/uL (ref 0.00–0.07)
Basophils Absolute: 0 10*3/uL (ref 0.0–0.1)
Basophils Relative: 0 %
Eosinophils Absolute: 0 10*3/uL (ref 0.0–0.5)
Eosinophils Relative: 1 %
HCT: 43.4 % (ref 36.0–46.0)
Hemoglobin: 14.2 g/dL (ref 12.0–15.0)
Immature Granulocytes: 0 %
Lymphocytes Relative: 55 %
Lymphs Abs: 2.7 10*3/uL (ref 0.7–4.0)
MCH: 28.1 pg (ref 26.0–34.0)
MCHC: 32.7 g/dL (ref 30.0–36.0)
MCV: 85.8 fL (ref 80.0–100.0)
Monocytes Absolute: 0.5 10*3/uL (ref 0.1–1.0)
Monocytes Relative: 9 %
Neutro Abs: 1.7 10*3/uL (ref 1.7–7.7)
Neutrophils Relative %: 35 %
Platelets: 185 10*3/uL (ref 150–400)
RBC: 5.06 MIL/uL (ref 3.87–5.11)
RDW: 13.4 % (ref 11.5–15.5)
WBC: 5 10*3/uL (ref 4.0–10.5)
nRBC: 0 % (ref 0.0–0.2)

## 2019-03-31 MED ORDER — AMOXICILLIN-POT CLAVULANATE 875-125 MG PO TABS: 1.0000 | ORAL_TABLET | Freq: Two times a day (BID) | ORAL | 0 refills | Status: AC

## 2019-03-31 MED ORDER — IBUPROFEN 400 MG PO TABS: 400.0000 mg | ORAL_TABLET | Freq: Four times a day (QID) | ORAL | 0 refills | Status: DC | PRN

## 2019-03-31 NOTE — ED Triage Notes (Signed)
Bilateral foot pain and swelling, this episode started last week.  No known injury Abdomen pain, left side of abdomen, also started one week ago.  Denies urinary symptoms

## 2019-03-31 NOTE — ED Provider Notes (Signed)
[facility]    CSN: [id number] Arrival date & time: 03/31/19  1018     History   Chief Complaint Chief Complaint  Patient presents with  . Foot Pain    HPI Margaret Singleton is a 48 y.o. female.   Margaret Singleton presents with complaints of LLQ abdominal pain which started approximately 1 week ago. Has improved some since initial onset, was 10/10 and now is 6/10 in severity. Nausea, no vomiting. Has had normal bowel movements, last was yesterday. Has been able to eat and drink, this does not worsen the pain. No fevers. Post menopausal. Hasn't taken any medications for symptoms. No urinary symptoms. No vaginal complaints. Per chart review has had diverticulitis diagnosis in the past, unable to confirm if this is similar in presentation to patient. Also complaints of bilateral ankle swelling. This is not a new problem, hasn't worsened. She has been prescribed HCTZ for this and recommended elevation and compression. She has not used compression. No redness, no pain, no injury. No shortness of breath  Or cough. Hx of htn.    ROS per HPI, negative if not otherwise mentioned.      Past Medical History:  Diagnosis Date  . Hypertension     Patient Active Problem List   Diagnosis Date Noted  . Chronic nonintractable headache 03/12/2019  . Edema 03/12/2019  . Essential hypertension 02/12/2019  . Swelling of both ankles 02/12/2019  . Cholelithiasis 02/21/2014  . Acute appendicitis 02/20/2014    Past Surgical History:  Procedure Laterality Date  . LAPAROSCOPIC APPENDECTOMY N/A 02/20/2014   Procedure: APPENDECTOMY LAPAROSCOPIC;  Surgeon: Liz Malady, MD;  Location: [facility];  Service: General;  Laterality: N/A;    OB History    Gravida  0   Para  0   Term  0   Preterm  0   AB  0   Living        SAB  0   TAB  0   Ectopic  0   Multiple      Live Births               Home Medications    Prior to Admission medications   Medication Sig Start  Date End Date Taking? Authorizing Provider  hydrochlorothiazide (HYDRODIURIL) 12.5 MG tablet Take 1 tablet (12.5 mg total) by mouth daily. 03/12/19  Yes Arnette Felts, FNP  amoxicillin-clavulanate (AUGMENTIN) 875-125 MG tablet Take 1 tablet by mouth every 12 (twelve) hours for 7 days. 03/31/19 04/07/19  Linus Mako B, NP  ibuprofen (ADVIL) 400 MG tablet Take 1 tablet (400 mg total) by mouth every 6 (six) hours as needed. 03/31/19   Georgetta Haber, NP  lisinopril (PRINIVIL,ZESTRIL) 5 MG tablet Take 1 tablet (5 mg total) by mouth daily. Patient needs an appointment Patient not taking: Reported on 03/12/2019 12/08/18   Arnette Felts, FNP    Family History Family History  Problem Relation Age of Onset  . Healthy Mother   . Healthy Father     Social History Social History   Tobacco Use  . Smoking status: Never Smoker  . Smokeless tobacco: Never Used  Substance Use Topics  . Alcohol use: No  . Drug use: No     Allergies   Oxycodone   Review of Systems Review of Systems   Physical Exam Triage Vital Signs ED Triage Vitals  Enc Vitals Group     BP 03/31/19 1033 131/74     Pulse Rate 03/31/19 1033 85  Resp 03/31/19 1033 18     Temp 03/31/19 1033 98.5 F (36.9 C)     Temp Source 03/31/19 1033 Oral     SpO2 03/31/19 1033 99 %     Weight --      Height --      Head Circumference --      Peak Flow --      Pain Score 03/31/19 1030 7     Pain Loc --      Pain Edu? --      Excl. in GC? --    No data found.  Updated Vital Signs BP 131/74 (BP Location: Left Arm)   Pulse 85   Temp 98.5 F (36.9 C) (Oral)   Resp 18   LMP 12/10/2016 (Approximate)   SpO2 99%    Physical Exam Constitutional:      General: She is not in acute distress.    Appearance: She is well-developed.  Cardiovascular:     Rate and Rhythm: Normal rate and regular rhythm.     Heart sounds: Normal heart sounds.  Pulmonary:     Effort: Pulmonary effort is normal.     Breath sounds: Normal breath  sounds.  Abdominal:     Tenderness: There is abdominal tenderness in the left lower quadrant.  Musculoskeletal:     Comments: No visible or palpable lower extremity edema present   Skin:    General: Skin is warm and dry.  Neurological:     Mental Status: She is alert and oriented to person, place, and time.      UC Treatments / Results  Labs (all labs ordered are listed, but only abnormal results are displayed) Labs Reviewed  POCT URINALYSIS DIP (DEVICE) - Abnormal; Notable for the following components:      Result Value   Hgb urine dipstick TRACE (*)    All other components within normal limits  CBC WITH DIFFERENTIAL/PLATELET    EKG None  Radiology No results found.  Procedures Procedures (including critical care time)  Medications Ordered in UC Medications - No data to display  Initial Impression / Assessment and Plan / UC Course  I have reviewed the triage vital signs and the nursing notes.  Pertinent labs & imaging results that were available during my care of the patient were reviewed by me and considered in my medical decision making (see chart for details).     Trace hgb to urine. Nephrolithiasis vs diverticulitis considered. Hx of diverticulitis per chart review, opted to treat at this time. Strict return/ER precautions. Compression, elevation for lower extremities recommended. CBC WNl today. Patient verbalized understanding and agreeable to plan.  Final Clinical Impressions(s) / UC Diagnoses   Final diagnoses:  Abdominal pain, left lower quadrant  Peripheral edema     Discharge Instructions     Please purchase compression stockings to help with your ankle swelling.  Elevate your feet as able.  Increase water intake.  With the pain you are experiencing we do get concerned about diverticulitis, I will treat this with antibiotics. This may cause loose stools.  Please go to ER if develop fevers, increased pain, inability to urinate, or otherwise  worsening.  Please follow up with your PCP in the next week for recheck if symptoms persist.    ED Prescriptions    Medication Sig Dispense Auth. Provider   amoxicillin-clavulanate (AUGMENTIN) 875-125 MG tablet Take 1 tablet by mouth every 12 (twelve) hours for 7 days. 14 tablet Georgetta HaberBurky, Khamani Daniely B, NP  ibuprofen (ADVIL) 400 MG tablet Take 1 tablet (400 mg total) by mouth every 6 (six) hours as needed. 30 tablet Georgetta Haber, NP     Controlled Substance Prescriptions [place] Controlled Substance Registry consulted? Not Applicable   Georgetta Haber, NP 03/31/19 1334

## 2019-03-31 NOTE — Discharge Instructions (Addendum)
Please purchase compression stockings to help with your ankle swelling.  Elevate your feet as able.  Increase water intake.  With the pain you are experiencing we do get concerned about diverticulitis, I will treat this with antibiotics. This may cause loose stools.  Please go to ER if develop fevers, increased pain, inability to urinate, or otherwise worsening.  Please follow up with your PCP in the next week for recheck if symptoms persist.

## 2019-04-02 ENCOUNTER — Telehealth (HOSPITAL_COMMUNITY): Payer: Self-pay | Admitting: Emergency Medicine

## 2019-04-02 NOTE — Telephone Encounter (Signed)
Attempted to reach patient. No answer at this time.   

## 2019-04-16 ENCOUNTER — Encounter: Payer: Self-pay | Admitting: Nurse Practitioner

## 2019-04-26 ENCOUNTER — Ambulatory Visit (HOSPITAL_COMMUNITY)
Admission: EM | Admit: 2019-04-26 | Discharge: 2019-04-26 | Disposition: A | Discharge status: Home / Self Care | Payer: BC Managed Care – PPO | Attending: Internal Medicine | Admitting: Internal Medicine

## 2019-04-26 ENCOUNTER — Other Ambulatory Visit: Payer: Self-pay

## 2019-04-26 ENCOUNTER — Encounter (HOSPITAL_COMMUNITY): Payer: Self-pay

## 2019-04-26 DIAGNOSIS — N950 Postmenopausal bleeding: Secondary | ICD-10-CM

## 2019-04-26 LAB — POCT PREGNANCY, URINE: Preg Test, Ur: NEGATIVE

## 2019-04-26 NOTE — ED Provider Notes (Signed)
[facility]    CSN: [id number] Arrival date & time: 04/26/19  [id number]     History   Chief Complaint Chief Complaint  Patient presents with  . Abdominal Pain    HPI Margaret Singleton is a 48 y.o. female with history of hypertension, cholelithiasis, and obesity presenting for bilateral lower abdominal and back pain.  Patient states her pain started a week ago, has been almost constant since.  Patient states that she noticed blood in her urine last Saturday and feels that the bleeding is getting heavier.  Patient states her last menstrual cycle was over 2 years ago.  States that this feels like she is having her period; endorses blood clots, cramping.  Patient denies history of anemia, sickle cell.  Denies headache, lightheadedness, dizziness, fatigue, weakness, shortness of breath, palpitations.  Denies hematochezia, melena, pain with bowel movements or change in bowel habits.  Patient denies history of kidney stones: Denies urinary frequency, urgency, burning with urination, decreased urine output.  No known history of fibroids, endometriosis, gynecological malignancy.    Past Medical History:  Diagnosis Date  . Hypertension     Patient Active Problem List   Diagnosis Date Noted  . Chronic nonintractable headache 03/12/2019  . Edema 03/12/2019  . Essential hypertension 02/12/2019  . Swelling of both ankles 02/12/2019  . Cholelithiasis 02/21/2014  . Acute appendicitis 02/20/2014    Past Surgical History:  Procedure Laterality Date  . LAPAROSCOPIC APPENDECTOMY N/A 02/20/2014   Procedure: APPENDECTOMY LAPAROSCOPIC;  Surgeon: Liz MaladyBurke E Thompson, MD;  Location: [facility];  Service: General;  Laterality: N/A;    OB History    Gravida  0   Para  0   Term  0   Preterm  0   AB  0   Living        SAB  0   TAB  0   Ectopic  0   Multiple      Live Births               Home Medications    Prior to Admission medications   Medication Sig Start Date End Date  Taking? Authorizing Provider  hydrochlorothiazide (HYDRODIURIL) 12.5 MG tablet Take 1 tablet (12.5 mg total) by mouth daily. 03/12/19   Arnette FeltsMoore, Janece, FNP  ibuprofen (ADVIL) 400 MG tablet Take 1 tablet (400 mg total) by mouth every 6 (six) hours as needed. 03/31/19   Georgetta HaberBurky, Natalie B, NP  lisinopril (PRINIVIL,ZESTRIL) 5 MG tablet Take 1 tablet (5 mg total) by mouth daily. Patient needs an appointment Patient not taking: Reported on 03/12/2019 12/08/18   Arnette FeltsMoore, Janece, FNP    Family History Family History  Problem Relation Age of Onset  . Healthy Mother   . Healthy Father     Social History Social History   Tobacco Use  . Smoking status: Never Smoker  . Smokeless tobacco: Never Used  Substance Use Topics  . Alcohol use: No  . Drug use: No     Allergies   Oxycodone   Review of Systems Review of Systems as per HPI   Physical Exam Triage Vital Signs ED Triage Vitals  Enc Vitals Group     BP 04/26/19 1030 128/79     Pulse Rate 04/26/19 1030 73     Resp 04/26/19 1030 18     Temp 04/26/19 1030 98.2 F (36.8 C)     Temp src --      SpO2 04/26/19 1030 97 %  Weight 04/26/19 1031 200 lb (90.7 kg)     Height --      Head Circumference --      Peak Flow --      Pain Score 04/26/19 1029 10     Pain Loc --      Pain Edu? --      Excl. in GC? --    No data found.  Updated Vital Signs BP 128/79   Pulse 73   Temp 98.2 F (36.8 C)   Resp 18   Wt 200 lb (90.7 kg)   LMP 12/10/2016 (Approximate)   SpO2 97%   BMI 43.28 kg/m   Visual Acuity Right Eye Distance:   Left Eye Distance:   Bilateral Distance:    Right Eye Near:   Left Eye Near:    Bilateral Near:     Physical Exam Constitutional:      General: She is not in acute distress.    Appearance: She is obese.  HENT:     Head: Normocephalic and atraumatic.  Eyes:     General: No scleral icterus.    Pupils: Pupils are equal, round, and reactive to light.  Cardiovascular:     Rate and Rhythm: Normal rate.   Pulmonary:     Effort: Pulmonary effort is normal. No respiratory distress.  Abdominal:     Tenderness: There is no right CVA tenderness or left CVA tenderness.     Comments: Obese abdomen that is soft, nondistended.  Normoactive bowel sounds in all 4 quadrants.  Patient is tender to palpation in left and right lower quadrants as well as suprapubic area.  No rebound tenderness or guarding.  Genitourinary:    Vagina: Normal. No vaginal discharge or tenderness.     Cervix: No cervical motion tenderness or discharge.     Adnexa:        Right: No mass or tenderness.         Left: No mass or tenderness.       Comments: Difficult to appreciate uterus second to habitus no lacerations, trauma appreciated in vaginal canal or introitus. Skin:    General: Skin is warm.     Capillary Refill: Capillary refill takes less than 2 seconds.     Coloration: Skin is not cyanotic, jaundiced, mottled or pale.  Neurological:     General: No focal deficit present.     Mental Status: She is alert and oriented to person, place, and time.  Psychiatric:        Mood and Affect: Mood normal.        Behavior: Behavior normal.      UC Treatments / Results  Labs (all labs ordered are listed, but only abnormal results are displayed) Labs Reviewed  POCT PREGNANCY, URINE    EKG None  Radiology No results found.  Procedures Procedures (including critical care time)  Medications Ordered in UC Medications - No data to display  Initial Impression / Assessment and Plan / UC Course  I have reviewed the triage vital signs and the nursing notes.  Pertinent labs & imaging results that were available during my care of the patient were reviewed by me and considered in my medical decision making (see chart for details).     48 year old female with history of hypertension, cholelithiasis, obesity presenting for middle pain.  POCT urinalysis done in office and reviewed by provider: Significant for gross  hematuria.  Patient stable in office, though history and pelvic exam are suggestive of postmenopausal  bleeding.  Patient to follow-up with GYN for further work-up.  Patient to take OTC analgesics for pain relief.  Return precautions discussed, patient verbalized understanding.   Final Clinical Impressions(s) / UC Diagnoses   Final diagnoses:  Postmenopausal bleeding     Discharge Instructions     Please follow-up with women's center within 1 week. You may take OTC Tylenol and/or ibuprofen for additional pain relief. Return to clinic if you develop lightheadedness, dizziness, weakness, fatigue, palpitations, shortness of breath.    ED Prescriptions    None     Controlled Substance Prescriptions [place] Controlled Substance Registry consulted? Not Applicable   Shea Evans, [place] 04/26/19 1128

## 2019-04-26 NOTE — Discharge Instructions (Addendum)
Please follow-up with women's center within 1 week. You may take OTC Tylenol and/or ibuprofen for additional pain relief. Return to clinic if you develop lightheadedness, dizziness, weakness, fatigue, palpitations, shortness of breath.

## 2019-04-26 NOTE — ED Triage Notes (Addendum)
Pt states she has stomach pain a week ago. Pt states she went to use the restroom on last Sunday she began to see blood while voiding this has been going on since last Sunday and the blood is getting heavier. Pt states she needs a work note.

## 2019-04-30 ENCOUNTER — Emergency Department (HOSPITAL_COMMUNITY)
Admission: EM | Admit: 2019-04-30 | Discharge: 2019-04-30 | Disposition: A | Discharge status: Home / Self Care | Payer: BC Managed Care – PPO | Attending: Emergency Medicine | Admitting: Emergency Medicine

## 2019-04-30 ENCOUNTER — Encounter (HOSPITAL_COMMUNITY): Payer: Self-pay | Admitting: Emergency Medicine

## 2019-04-30 ENCOUNTER — Other Ambulatory Visit: Payer: Self-pay

## 2019-04-30 ENCOUNTER — Emergency Department (HOSPITAL_COMMUNITY): Payer: BC Managed Care – PPO

## 2019-04-30 DIAGNOSIS — I10 Essential (primary) hypertension: Secondary | ICD-10-CM | POA: Diagnosis not present

## 2019-04-30 DIAGNOSIS — Z79899 Other long term (current) drug therapy: Secondary | ICD-10-CM | POA: Insufficient documentation

## 2019-04-30 DIAGNOSIS — N939 Abnormal uterine and vaginal bleeding, unspecified: Secondary | ICD-10-CM | POA: Insufficient documentation

## 2019-04-30 DIAGNOSIS — R1030 Lower abdominal pain, unspecified: Secondary | ICD-10-CM

## 2019-04-30 DIAGNOSIS — R102 Pelvic and perineal pain: Secondary | ICD-10-CM | POA: Diagnosis present

## 2019-04-30 LAB — CBC
HCT: 38.9 % (ref 36.0–46.0)
Hemoglobin: 12.3 g/dL (ref 12.0–15.0)
MCH: 28.3 pg (ref 26.0–34.0)
MCHC: 31.6 g/dL (ref 30.0–36.0)
MCV: 89.4 fL (ref 80.0–100.0)
Platelets: 206 10*3/uL (ref 150–400)
RBC: 4.35 MIL/uL (ref 3.87–5.11)
RDW: 14.2 % (ref 11.5–15.5)
WBC: 6.9 10*3/uL (ref 4.0–10.5)
nRBC: 0 % (ref 0.0–0.2)

## 2019-04-30 LAB — COMPREHENSIVE METABOLIC PANEL
ALT: 20 U/L (ref 0–44)
AST: 22 U/L (ref 15–41)
Albumin: 3.3 g/dL — ABNORMAL LOW (ref 3.5–5.0)
Alkaline Phosphatase: 93 U/L (ref 38–126)
Anion gap: 6 (ref 5–15)
BUN: 11 mg/dL (ref 6–20)
CO2: 28 mmol/L (ref 22–32)
Calcium: 9.2 mg/dL (ref 8.9–10.3)
Chloride: 107 mmol/L (ref 98–111)
Creatinine, Ser: 0.82 mg/dL (ref 0.44–1.00)
GFR calc Af Amer: >60 mL/min (ref >60)
GFR calc non Af Amer: >60 mL/min (ref >60)
Glucose, Bld: 87 mg/dL (ref 70–99)
Potassium: 4 mmol/L (ref 3.5–5.1)
Sodium: 141 mmol/L (ref 135–145)
Total Bilirubin: 0.6 mg/dL (ref 0.3–1.2)
Total Protein: 7.6 g/dL (ref 6.5–8.1)

## 2019-04-30 LAB — URINALYSIS, ROUTINE W REFLEX MICROSCOPIC
Bilirubin Urine: NEGATIVE
Glucose, UA: NEGATIVE mg/dL
Hgb urine dipstick: NEGATIVE
Ketones, ur: NEGATIVE mg/dL
Leukocytes,Ua: NEGATIVE
Nitrite: NEGATIVE
Protein, ur: NEGATIVE mg/dL
Specific Gravity, Urine: 1.019 (ref 1.005–1.030)
pH: 7 (ref 5.0–8.0)

## 2019-04-30 LAB — WET PREP, GENITAL
Clue Cells Wet Prep HPF POC: NONE SEEN
Sperm: NONE SEEN
Trich, Wet Prep: NONE SEEN
Yeast Wet Prep HPF POC: NONE SEEN

## 2019-04-30 LAB — I-STAT BETA HCG BLOOD, ED (MC, WL, AP ONLY): I-stat hCG, quantitative: <5 m[IU]/mL (ref <5)

## 2019-04-30 MED ORDER — SODIUM CHLORIDE 0.9% FLUSH: 3.0000 mL | Freq: Once | INTRAVENOUS | Status: DC

## 2019-04-30 MED ORDER — ACETAMINOPHEN 325 MG PO TABS
650.0000 mg | ORAL_TABLET | Freq: Once | ORAL | Status: AC
  Administered 2019-04-30: 650 mg via ORAL
  Filled 2019-04-30: qty 2

## 2019-04-30 NOTE — ED Provider Notes (Signed)
[facility] EMERGENCY DEPARTMENT Provider Note   CSN: [id number] Arrival date & time: 04/30/19  1200     History   Chief Complaint Chief Complaint  Patient presents with   Vaginal Bleeding    HPI Margaret Singleton is a 48 y.o. female with a PMH of HTN presenting with intermittent vaginal bleeding and lower abdominal pain for 8 days. Patient describes pain as a heaviness. Patient denies taking any medications for symptoms. Patient states nothing makes symptoms better or worse. Patient reports large clots with bleeding and states last episode of vaginal bleeding was yesterday. Patient reports using 3-4 tampons per day. Patient states her LMP prior to this episode was 2 years ago. Patient states she is sexually active with husband. Patient denies discharge, urinary frequency, or dysuria. Patient reports she has had an appendectomy in the past. Patient states she called women's clinic today and was advised to come to the ER. Patient denies a personal or family history of uterine, ovarian, or breast malignancies. Patient denies fever, cough, shortness of breath, chest pain, congestion, rhinorrhea, nausea, vomiting, or diarrhea. Patient denies dizziness, lightheadedness, weakness, or syncope. Patient denies recent travel or sick contacts.      HPI  Past Medical History:  Diagnosis Date   Hypertension     Patient Active Problem List   Diagnosis Date Noted   Chronic nonintractable headache 03/12/2019   Edema 03/12/2019   Essential hypertension 02/12/2019   Swelling of both ankles 02/12/2019   Cholelithiasis 02/21/2014   Acute appendicitis 02/20/2014    Past Surgical History:  Procedure Laterality Date   LAPAROSCOPIC APPENDECTOMY N/A 02/20/2014   Procedure: APPENDECTOMY LAPAROSCOPIC;  Surgeon: Liz Malady, MD;  Location: [facility] OR;  Service: General;  Laterality: N/A;     OB History    Gravida  0   Para  0   Term  0   Preterm  0   AB  0   Living          SAB  0   TAB  0   Ectopic  0   Multiple      Live Births               Home Medications    Prior to Admission medications   Medication Sig Start Date End Date Taking? Authorizing Provider  hydrochlorothiazide (HYDRODIURIL) 12.5 MG tablet Take 1 tablet (12.5 mg total) by mouth daily. 03/12/19  Yes Arnette Felts, FNP  ibuprofen (ADVIL) 400 MG tablet Take 1 tablet (400 mg total) by mouth every 6 (six) hours as needed. Patient taking differently: Take 400 mg by mouth every 6 (six) hours as needed for moderate pain.  03/31/19  Yes Georgetta Haber, NP    Family History Family History  Problem Relation Age of Onset   Healthy Mother    Healthy Father     Social History Social History   Tobacco Use   Smoking status: Never Smoker   Smokeless tobacco: Never Used  Substance Use Topics   Alcohol use: No   Drug use: No     Allergies   Oxycodone   Review of Systems Review of Systems  Constitutional: Negative for activity change, appetite change, chills, fever and unexpected weight change.  HENT: Negative for congestion, rhinorrhea and sore throat.   Eyes: Negative for visual disturbance.  Respiratory: Negative for cough and shortness of breath.   Cardiovascular: Negative for chest pain.  Gastrointestinal: Positive for abdominal pain. Negative for blood in stool,  constipation, diarrhea, nausea and vomiting.  Endocrine: Negative for polydipsia, polyphagia and polyuria.  Genitourinary: Positive for pelvic pain and vaginal bleeding. Negative for dysuria, flank pain, frequency, vaginal discharge and vaginal pain.  Musculoskeletal: Negative for back pain and gait problem.  Skin: Negative for rash.  Allergic/Immunologic: Negative for immunocompromised state.  Neurological: Negative for dizziness, syncope, weakness, light-headedness and headaches.  Psychiatric/Behavioral: The patient is not nervous/anxious.      Physical Exam Updated Vital Signs BP 125/78     Pulse 70    Temp 98.1 F (36.7 C) (Oral)    Resp 14    Ht  (1.448 m)    Wt 90.7 kg    LMP 12/10/2016 (Approximate)    SpO2 99%    BMI 43.28 kg/m   Physical Exam Vitals signs and nursing note reviewed. Exam conducted with a chaperone present.  Constitutional:      General: She is not in acute distress.    Appearance: She is well-developed. She is obese. She is not diaphoretic.  HENT:     Head: Normocephalic and atraumatic.  Neck:     Musculoskeletal: Normal range of motion.  Cardiovascular:     Rate and Rhythm: Normal rate and regular rhythm.     Heart sounds: Normal heart sounds. No murmur. No friction rub. No gallop.   Pulmonary:     Effort: Pulmonary effort is normal. No respiratory distress.     Breath sounds: Normal breath sounds. No wheezing or rales.  Abdominal:     General: There is no distension.     Palpations: Abdomen is soft.     Tenderness: There is abdominal tenderness in the right lower quadrant and left lower quadrant. There is no right CVA tenderness, left CVA tenderness, guarding or rebound. Negative signs include McBurney's sign.  Genitourinary:    Vagina: Normal. No signs of injury and foreign body. No vaginal discharge, erythema, tenderness, bleeding or lesions.     Cervix: No cervical motion tenderness, discharge, lesion, erythema or cervical bleeding.     Uterus: Normal.      Adnexa: Right adnexa normal and left adnexa normal.       Right: No mass or tenderness.         Left: No mass or tenderness.       Comments: No vaginal bleeding noted on exam. No lesions, erythema, or discharge noted. No CMT on exam.  Musculoskeletal: Normal range of motion.  Skin:    General: Skin is warm.     Findings: No erythema or rash.  Neurological:     Mental Status: She is alert.      ED Treatments / Results  Labs (all labs ordered are listed, but only abnormal results are displayed) Labs Reviewed  WET PREP, GENITAL - Abnormal; Notable for the following components:       Result Value   WBC, Wet Prep HPF POC MANY (*)    All other components within normal limits  COMPREHENSIVE METABOLIC PANEL - Abnormal; Notable for the following components:   Albumin 3.3 (*)    All other components within normal limits  URINALYSIS, ROUTINE W REFLEX MICROSCOPIC - Abnormal; Notable for the following components:   APPearance CLOUDY (*)    All other components within normal limits  CBC  I-STAT BETA HCG BLOOD, ED ([facility], [facility], [facility] ONLY)  GC/CHLAMYDIA PROBE AMP ([place]) NOT AT [facility]    EKG None  Radiology US Transvaginal Non-ob  Result Date: 04/30/2019 CLINICAL DATA:  Pain.  Postmenopausal bleeding. EXAM: TRANSABDOMINAL AND TRANSVAGINAL ULTRASOUND OF PELVIS DOPPLER ULTRASOUND OF OVARIES TECHNIQUE: Both transabdominal and transvaginal ultrasound examinations of the pelvis were performed. Transabdominal technique was performed for global imaging of the pelvis including uterus, ovaries, adnexal regions, and pelvic cul-de-sac. It was necessary to proceed with endovaginal exam following the transabdominal exam to visualize the endometrium and ovaries. Color and duplex Doppler ultrasound was utilized to evaluate blood flow to the ovaries. COMPARISON:  None. FINDINGS: Uterus Measurements: 8.3 x 4.5 x 6.2 cm = volume: 120.3 mL. No fibroids or other mass visualized. Endometrium Thickness: 8.9 mm. At least 1 small cystic region is seen within the thickened endometrium. Right ovary Not well visualized. The right ovary is thought to measure approximately 2.7 x 1.2 x 1.1 cm with a volume of 1.9 mL. No mass in this region. Left ovary Measurements: 2.7 x 1.2 x 2.6 cm = volume: 4.3 mL. Normal appearance/no adnexal mass. Pulsed Doppler evaluation of both ovaries demonstrates normal low-resistance arterial and venous waveforms. Other findings Trace fluid in the pelvis, likely physiologic. IMPRESSION: 1. The endometrium is thickened given the patient's postmenopausal status. There is also a small  cystic region within the endometrium. In the setting of post-menopausal bleeding, endometrial sampling is indicated to exclude carcinoma. If results are benign, sonohysterogram should be considered for focal lesion work-up. (Ref: Radiological Reasoning: Algorithmic Workup of Abnormal Vaginal Bleeding with Endovaginal Sonography and Sonohysterography. AJR 2008; 010:O71-21) 2. The right ovary is not well assessed but no right adnexal masses are noted. There appears to be arterial and venous blood flow in the right ovary on limited images. 3. The left ovary is normal in appearance with arterial and venous blood flow. Electronically Signed   By: Gerome Sam III M.D   On: 04/30/2019 19:19   US Pelvis Complete  Result Date: 04/30/2019 CLINICAL DATA:  Pain.  Postmenopausal bleeding. EXAM: TRANSABDOMINAL AND TRANSVAGINAL ULTRASOUND OF PELVIS DOPPLER ULTRASOUND OF OVARIES TECHNIQUE: Both transabdominal and transvaginal ultrasound examinations of the pelvis were performed. Transabdominal technique was performed for global imaging of the pelvis including uterus, ovaries, adnexal regions, and pelvic cul-de-sac. It was necessary to proceed with endovaginal exam following the transabdominal exam to visualize the endometrium and ovaries. Color and duplex Doppler ultrasound was utilized to evaluate blood flow to the ovaries. COMPARISON:  None. FINDINGS: Uterus Measurements: 8.3 x 4.5 x 6.2 cm = volume: 120.3 mL. No fibroids or other mass visualized. Endometrium Thickness: 8.9 mm. At least 1 small cystic region is seen within the thickened endometrium. Right ovary Not well visualized. The right ovary is thought to measure approximately 2.7 x 1.2 x 1.1 cm with a volume of 1.9 mL. No mass in this region. Left ovary Measurements: 2.7 x 1.2 x 2.6 cm = volume: 4.3 mL. Normal appearance/no adnexal mass. Pulsed Doppler evaluation of both ovaries demonstrates normal low-resistance arterial and venous waveforms. Other findings Trace  fluid in the pelvis, likely physiologic. IMPRESSION: 1. The endometrium is thickened given the patient's postmenopausal status. There is also a small cystic region within the endometrium. In the setting of post-menopausal bleeding, endometrial sampling is indicated to exclude carcinoma. If results are benign, sonohysterogram should be considered for focal lesion work-up. (Ref: Radiological Reasoning: Algorithmic Workup of Abnormal Vaginal Bleeding with Endovaginal Sonography and Sonohysterography. AJR 2008; 975:O83-25) 2. The right ovary is not well assessed but no right adnexal masses are noted. There appears to be arterial and venous blood flow in the right ovary on limited images. 3. The left  ovary is normal in appearance with arterial and venous blood flow. Electronically Signed   By: Gerome Samavid  Williams III M.D   On: 04/30/2019 19:19   [place] Art/ven Flow Abd Pelv Doppler  Result Date: 04/30/2019 CLINICAL DATA:  Pain.  Postmenopausal bleeding. EXAM: TRANSABDOMINAL AND TRANSVAGINAL ULTRASOUND OF PELVIS DOPPLER ULTRASOUND OF OVARIES TECHNIQUE: Both transabdominal and transvaginal ultrasound examinations of the pelvis were performed. Transabdominal technique was performed for global imaging of the pelvis including uterus, ovaries, adnexal regions, and pelvic cul-de-sac. It was necessary to proceed with endovaginal exam following the transabdominal exam to visualize the endometrium and ovaries. Color and duplex Doppler ultrasound was utilized to evaluate blood flow to the ovaries. COMPARISON:  None. FINDINGS: Uterus Measurements: 8.3 x 4.5 x 6.2 cm = volume: 120.3 mL. No fibroids or other mass visualized. Endometrium Thickness: 8.9 mm. At least 1 small cystic region is seen within the thickened endometrium. Right ovary Not well visualized. The right ovary is thought to measure approximately 2.7 x 1.2 x 1.1 cm with a volume of 1.9 mL. No mass in this region. Left ovary Measurements: 2.7 x 1.2 x 2.6 cm = volume: 4.3 mL.  Normal appearance/no adnexal mass. Pulsed Doppler evaluation of both ovaries demonstrates normal low-resistance arterial and venous waveforms. Other findings Trace fluid in the pelvis, likely physiologic. IMPRESSION: 1. The endometrium is thickened given the patient's postmenopausal status. There is also a small cystic region within the endometrium. In the setting of post-menopausal bleeding, endometrial sampling is indicated to exclude carcinoma. If results are benign, sonohysterogram should be considered for focal lesion work-up. (Ref: Radiological Reasoning: Algorithmic Workup of Abnormal Vaginal Bleeding with Endovaginal Sonography and Sonohysterography. AJR 2008; 119:[id number]:[id number]) 2. The right ovary is not well assessed but no right adnexal masses are noted. There appears to be arterial and venous blood flow in the right ovary on limited images. 3. The left ovary is normal in appearance with arterial and venous blood flow. Electronically Signed   By: Gerome Samavid  Williams III M.D   On: 04/30/2019 19:19    Procedures Procedures (including critical care time)  Medications Ordered in ED Medications  sodium chloride flush (NS) 0.9 % injection 3 mL (has no administration in time range)  acetaminophen (TYLENOL) tablet 650 mg (650 mg Oral Given 04/30/19 1709)     Initial Impression / Assessment and Plan / ED Course  I have reviewed the triage vital signs and the nursing notes.  Pertinent labs & imaging results that were available during my care of the patient were reviewed by me and considered in my medical decision making (see chart for details).  Clinical Course as of Apr 29 2000  Mon Apr 30, 2019  1706 Hemoglobin stable at 12.3. Appears consistent with previous values.  Hemoglobin: 12.3 [AH]  1706 UA does not reveal signs of infection.  Urinalysis, Routine w reflex microscopic(!) [AH]  1706 Wet prep reveals many WBCs.  Wet prep, genital(!) [AH]  [id number] The endometrium is thickened given the patient's  postmenopausal status. There is also a small cystic region within the endometrium. In the setting of post-menopausal bleeding, endometrial sampling is indicated to exclude carcinoma. If results are benign, sonohysterogram should be considered for focal lesion work-up.The right ovary is not well assessed but no right adnexal masses are noted. There appears to be arterial and venous blood flow in the right ovary on limited images. The left ovary is normal in appearance with arterial and venous blood flow.    US Pelvis Complete [AH]  Clinical Course User Index [AH] Leretha Dykes, [place]      Patient presents with vaginal bleeding and lower abdominal pain. Exam does not reveal any vaginal bleeding. Hemoglobin is stable. UA does not reveal signs of infection. Wet prep reveals many WBCs. Patient states she has not been sexually active for 1 year. Patient does not wish to have prophylactic STD treatment at this time. Ordered pelvic and transvaginal ultrasound due to location of pain. Ultrasound reveals a thickened endometrium and a small cyst in endometrium. Discussed findings in detail with patient and concern for carcinoma. Discussed the importance of follow up with OBGYN. Patient states pain has improved while in the ER and patient has not had additional vaginal bleeding. Advised patient to follow up with OBGYN and provided referral. Patient states she understands and agrees with plan.   Final Clinical Impressions(s) / ED Diagnoses   Final diagnoses:  Vaginal bleeding  Lower abdominal pain    ED Discharge Orders    None       Leretha Dykes, PA-C 04/30/19 Consuello Masse, MD 05/01/19 1746

## 2019-04-30 NOTE — ED Triage Notes (Signed)
Patient reports lower abd pain and vaginal bleeding since last Sunday. She states that she was told to call the women's clinic but stated that they told her to come to the ED here.

## 2019-04-30 NOTE — Discharge Instructions (Addendum)
You have been seen today for vaginal bleeding and lower abdominal pain. Please read and follow all provided instructions.   1. Medications: tylenol/ibuprofen for pain, usual home medications 2. Treatment: rest, drink plenty of fluids 3. Follow Up: Please follow up with your OBGYN/primary doctor in 2 days for discussion of your diagnoses and further evaluation after today's visit; if you do not have a primary care doctor use the resource guide provided to find one; Please return to the ER for any new or worsening symptoms. Please obtain all of your results from medical records or have your doctors office obtain the results - share them with your doctor - you should be seen at your doctors office. Call today to arrange your follow up.   Take medications as prescribed. Please review all of the medicines and only take them if you do not have an allergy to them. Return to the emergency room for worsening condition or new concerning symptoms. Follow up with your regular doctor. If you don't have a regular doctor use one of the numbers below to establish a primary care doctor.  Please be aware that if you are taking birth control pills, taking other prescriptions, ESPECIALLY ANTIBIOTICS may make the birth control ineffective - if this is the case, either do not engage in sexual activity or use alternative methods of birth control such as condoms until you have finished the medicine and your family doctor says it is OK to restart them. If you are on a blood thinner such as COUMADIN, be aware that any other medicine that you take may cause the coumadin to either work too much, or not enough - you should have your coumadin level rechecked in next 7 days if this is the case.  ?  It is also a possibility that you have an allergic reaction to any of the medicines that you have been prescribed - Everybody reacts differently to medications and while MOST people have no trouble with most medicines, you may have a reaction  such as nausea, vomiting, rash, swelling, shortness of breath. If this is the case, please stop taking the medicine immediately and contact your physician.  ?  You should return to the ER if you develop severe or worsening symptoms.   Emergency Department Resource Guide 1) Find a Doctor and Pay Out of Pocket Although you won't have to find out who is covered by your insurance plan, it is a good idea to ask around and get recommendations. You will then need to call the office and see if the doctor you have chosen will accept you as a new patient and what types of options they offer for patients who are self-pay. Some doctors offer discounts or will set up payment plans for their patients who do not have insurance, but you will need to ask so you aren't surprised when you get to your appointment.  2) Contact Your Local Health Department Not all health departments have doctors that can see patients for sick visits, but many do, so it is worth a call to see if yours does. If you don't know where your local health department is, you can check in your phone book. The CDC also has a tool to help you locate your state's health department, and many state websites also have listings of all of their local health departments.  3) Find a Walk-in Clinic If your illness is not likely to be very severe or complicated, you may want to try a walk in  clinic. These are popping up all over the country in pharmacies, drugstores, and shopping centers. They're usually staffed by nurse practitioners or physician assistants that have been trained to treat common illnesses and complaints. They're usually fairly quick and inexpensive. However, if you have serious medical issues or chronic medical problems, these are probably not your best option.  No Primary Care Doctor: Call [facility] Connect at  [phone] - they can help you locate a primary care doctor that  accepts your insurance, provides certain services, etc. Physician  Referral Service- [phone]  Chronic Pain Problems: Organization         Address  Phone   Notes  [facility]  [phone] Patients need to be referred by their primary care doctor.   Medication Assistance: Organization         Address  Phone   Notes  [facility] Medication [facility] [address] [place], [place] [address] [phone] --Must be a resident of [facility] -- Must have NO insurance coverage whatsoever (no Medicaid/ Medicare, etc.) -- The pt. MUST have a primary care doctor that directs their care regularly and follows them in the community   [facility]  [phone]   [employer]  [phone]    Agencies that provide inexpensive medical care: Organization         Address  Phone   Notes  [place]  [phone]   [facility]    [phone]   [facility] [place], [place] [address] [phone]   [place] [address], [place] [phone]   [facility]    [phone]   [facility]    [phone]   [place] and [address], [place] Phone:  [phone], Fax:  [phone] Hours of Operation:  9 am - 6 pm, M-F.  Also accepts Medicaid/Medicare and self-pay.  [facility] for [place], [address], [place] Phone: [phone], Fax: [phone]. Hours of Operation:  8:30 am - 5:30 pm, M-F.  Also accepts Medicaid and self-pay.  [facility] [place] [address] [place] Phone: [phone]   [place], [place], [place] [phone], [address]. [phone] Mondays & Thursdays: 7-9 AM.  First 15 patients are seen on a first come, first serve basis.    [place] Providers:  Organization         Address  Phone   Notes  [facility] [address], [place] [phone] Also accepts self-pay patients.  [facility] [address], [place]  [phone]   [place], [address], [place] [phone]   [facility] [address] [place] [phone]   Lucianne Lei [address], [place]   [phone] Only accepts [place] patients after they have their name applied to their card.   Self-Pay (no insurance) in [facility]:  Organization         Address  Phone   Notes  Sickle Cell Patients, [facility] [place] [phone]   [facility] [place] [phone]   [facility] [place]  [place], [address], [place] [phone]   [facility]/Dr. Osei-Bonsu  [address] [place] or [address], [place] [phone] Phone number for both [place] and [place] locations is the same.  Urgent Medical and [facility] [address] [place] [phone]   [facility] [address] [place] or [address] [phone]   [facility] [address], [place] [phone], phone; [phone], fax Sees patients 1st and 3rd Saturday of every month.  Must not qualify for public or private insurance (i.e. Medicaid, Medicare, [place] Health Choice, Veterans' Benefits)  Household income should be no more than 200% of the poverty level The clinic cannot treat you if you are pregnant or think you are pregnant  Sexually transmitted diseases are not treated at the clinic.

## 2019-05-01 LAB — GC/CHLAMYDIA PROBE AMP (~~LOC~~) NOT AT ARMC
Chlamydia: NEGATIVE
Neisseria Gonorrhea: NEGATIVE

## 2019-05-09 ENCOUNTER — Telehealth: Payer: Self-pay

## 2019-05-09 NOTE — Telephone Encounter (Signed)
I called pt to see if she was able to schedule an appointment with a GYN and the patient stated she has an appointment tomorrow on 06/11 I also seen the appointment on Epic. YRL,RMA

## 2019-05-10 ENCOUNTER — Other Ambulatory Visit (HOSPITAL_COMMUNITY)
Admission: RE | Admit: 2019-05-10 | Discharge: 2019-05-10 | Disposition: A | Discharge status: Home / Self Care | Payer: BC Managed Care – PPO | Source: Ambulatory Visit | Attending: Obstetrics and Gynecology | Admitting: Obstetrics and Gynecology

## 2019-05-10 ENCOUNTER — Ambulatory Visit: Payer: BC Managed Care – PPO | Admitting: Obstetrics and Gynecology

## 2019-05-10 ENCOUNTER — Other Ambulatory Visit: Payer: Self-pay

## 2019-05-10 ENCOUNTER — Encounter: Payer: Self-pay | Admitting: Obstetrics and Gynecology

## 2019-05-10 VITALS — BP 130/78 | HR 72 | Temp 98.1°F | Ht <= 58 in | Wt 188.6 lb

## 2019-05-10 DIAGNOSIS — N950 Postmenopausal bleeding: Secondary | ICD-10-CM | POA: Insufficient documentation

## 2019-05-10 DIAGNOSIS — Z124 Encounter for screening for malignant neoplasm of cervix: Secondary | ICD-10-CM | POA: Diagnosis present

## 2019-05-10 DIAGNOSIS — R9389 Abnormal findings on diagnostic imaging of other specified body structures: Secondary | ICD-10-CM | POA: Diagnosis not present

## 2019-05-10 NOTE — Progress Notes (Addendum)
48 y.o. [id number] Married Black or [place] Not Hispanic or Latino female sent here from the ER for  postmenopausal bleeding. She was seen in the ER on 04/30/19 with c/o of vaginal bleeding and abdominal pain. Negative pregnancy test, normal CBC, and a negative genprobe. She went a year without a cycle. She started having cramps on 04/22/19, on 04/23/19 she started bleeding. Bleeding lasted for 3-4 days, not to heavy. The bleeding and cramping have resolved. No hot flashes or night sweats. Husband is in [place], last some him a year ago and was sexually active then.     Period Duration (Days): 4 days Period Pattern: (!) Irregular Menstrual Flow: Moderate, Light Menstrual Control: Tampon, Thin pad Menstrual Control Change Freq (Hours): changes pad/tampon every 4-5 hours Dysmenorrhea: (!) Mild Dysmenorrhea Symptoms: Cramping  Patient's last menstrual period was 12/10/2016 (approximate).          Sexually active: No.  The current method of family planning is none.    Exercising: No.  The patient does not participate in regular exercise at present. Smoker:  no  Health Maintenance: Pap: Unsure  History of abnormal Pap: Unsure MMG:  Never BMD:   Never Colonoscopy: Never TDaP:  UTD per patient Gardasil: No   reports that she has never smoked. She has never used smokeless tobacco. She reports that she does not drink alcohol or use drugs.  Past Medical History:  Diagnosis Date  . Hypertension     Past Surgical History:  Procedure Laterality Date  . LAPAROSCOPIC APPENDECTOMY N/A 02/20/2014   Procedure: APPENDECTOMY LAPAROSCOPIC;  Surgeon: Liz MaladyBurke E Thompson, MD;  Location: [facility];  Service: General;  Laterality: N/A;    Current Outpatient Medications  Medication Sig Dispense Refill  . hydrochlorothiazide (HYDRODIURIL) 12.5 MG tablet Take 1 tablet (12.5 mg total) by mouth daily. 90 tablet 1  . ibuprofen (ADVIL) 400 MG tablet Take 1 tablet (400 mg total) by mouth every 6 (six) hours as  needed. (Patient taking differently: Take 400 mg by mouth every 6 (six) hours as needed for moderate pain. ) 30 tablet 0   No current facility-administered medications for this visit.     Family History  Problem Relation Age of Onset  . Healthy Mother   . Healthy Father     Review of Systems  Constitutional: Negative.   HENT: Negative.   Eyes: Negative.   Respiratory: Negative.   Cardiovascular: Negative.   Gastrointestinal: Negative.   Endocrine: Negative.   Genitourinary: Positive for vaginal bleeding.  Musculoskeletal: Negative.   Skin: Negative.   Allergic/Immunologic: Negative.   Neurological: Negative.   Hematological: Negative.   Psychiatric/Behavioral: Negative.     Exam:   BP 130/78 (BP Location: Right Arm, Patient Position: Sitting, Cuff Size: Large)   Pulse 72   Temp 98.1 F (36.7 C) (Skin)   Ht 4' 9.68" (1.465 m)   Wt 188 lb 9.6 oz (85.5 kg)   LMP 12/10/2016 (Approximate)   BMI 39.86 kg/m   Weight change: @WEIGHTCHANGE @ Height:   Height: 4' 9.68" (146.5 cm)  Ht Readings from Last 3 Encounters:  05/10/19 4' 9.68" (1.465 m)  04/30/19 4\' 9"  (1.448 m)  02/12/19 4\' 9"  (1.448 m)    General appearance: alert, cooperative and appears stated age Head: Normocephalic, without obvious abnormality, atraumatic Neck: no adenopathy, supple, symmetrical, trachea midline and thyroid normal to inspection and palpation Lungs: clear to auscultation bilaterally Cardiovascular: regular rate and rhythm Abdomen: soft, non-tender; non distended,  no masses,  no organomegaly  Extremities: extremities normal, atraumatic, no cyanosis or edema Skin: Skin color, texture, turgor normal. No rashes or lesions Lymph nodes: Cervical, supraclavicular, and axillary nodes normal. No abnormal inguinal nodes palpated Neurologic: Grossly normal   Pelvic: External genitalia:  no lesions              Urethra:  normal appearing urethra with no masses, tenderness or lesions               Bartholins and Skenes: normal                 Vagina: normal appearing vagina with normal color and discharge, no lesions              Cervix: no lesions               Bimanual Exam:  Uterus:  normal size, contour, position, consistency, mobility, non-tender              Adnexa: no mass, fullness, tenderness               Rectovaginal: Confirms               Anus:  normal sphincter tone, no lesions  The risks of endometrial biopsy were reviewed and a consent was obtained.  A speculum was placed in the vagina and the cervix was cleansed with betadine. The mini-pipelle was placed into the endometrial cavity. The uterus sounded to ~8 cm. The endometrial biopsy was performed, minimal tissue was obtained. The tenaculum and speculum were removed. There were no complications.    Chaperone was present for exam.  A:  Postmenopausal bleeding  Thickened endometrial stripe  P:   Endometrial biopsy done  [facility]  Pap with hpv  Will likely return for a sonohysterogram (will await biopsy results)  Encouraged the patient to get a mammogram and colon cancer screening through her primary  Addendum: Impression from Ultrasound report from 6/1: IMPRESSION: 1. The endometrium is thickened given the patient's postmenopausal status. There is also a small cystic region within the endometrium. In the setting of post-menopausal bleeding, endometrial sampling is indicated to exclude carcinoma. If results are benign, sonohysterogram should be considered for focal lesion work-up. (Ref: Radiological Reasoning: Algorithmic Workup of Abnormal Vaginal Bleeding with Endovaginal Sonography and Sonohysterography. AJR 2008; 017:B93-90) 2. The right ovary is not well assessed but no right adnexal masses are noted. There appears to be arterial and venous blood flow in the right ovary on limited images. 3. The left ovary is normal in appearance with arterial and venous blood flow.  Images were reviewed.

## 2019-05-10 NOTE — Patient Instructions (Signed)

## 2019-05-11 LAB — FOLLICLE STIMULATING HORMONE: FSH: 37.9 m[IU]/mL

## 2019-05-14 ENCOUNTER — Telehealth: Payer: Self-pay | Admitting: Obstetrics and Gynecology

## 2019-05-14 DIAGNOSIS — R9389 Abnormal findings on diagnostic imaging of other specified body structures: Secondary | ICD-10-CM

## 2019-05-14 NOTE — Telephone Encounter (Signed)
Patient's daughter Hassan Rowan is returning a call to [place]. DPR on file to talk with Hassan Rowan.

## 2019-05-14 NOTE — Telephone Encounter (Signed)
Spoke with Hassan Rowan and patient. [facility]GM scheduled for 05/17/2019 at 10 am. Patient is agreeable to date and time. Order placed for precert.  05/14/2019 at 10:52 AM EDT  Spoke with patient and patient's daughter Hassan Rowan, okay per ROI. Advised of results and need for [facility]. Patient and daughter are agreeable. Will check work schedule and return call to schedule an appointment.  ------   Notes recorded by Salvadore Dom, MD on 05/13/2019 at 4:16 PM EDT  Please let the patient know that her endometrial biopsy is benign, but doesn't explain her thickened endometrium. Please set her up for a sonohysterogram.  Her Jewett is slightly elevated, I suspect she may be perimenopausal instead of menopausal, this number can fluctuate and is in the low menopausal range. This will help me manage her care going forward, but we still need to evaluate her thickened endometrium.  Routing to provider and will close encounter.

## 2019-05-15 ENCOUNTER — Other Ambulatory Visit: Payer: Self-pay

## 2019-05-15 LAB — CYTOLOGY - PAP
Diagnosis: NEGATIVE
HPV: NOT DETECTED

## 2019-05-16 NOTE — Progress Notes (Signed)
GYNECOLOGY  VISIT   HPI: 48 y.o.   Married Black or [place] American Not Hispanic or Latino  female   [phone] with Patient's last menstrual period was 03/30/2019.   here for   sonohysterogram for postmenopausal bleeding and a thickened endometrial stripe. FSH 37.9, Pap negative, Endometrial biopsy with atrophy. Prior ultrasound with thickened endometrial stripe of 8 mm.  GYNECOLOGIC HISTORY: Patient's last menstrual period was 03/30/2019. Contraception: none Menopausal hormone therapy: none        OB History    Gravida  5   Para  4   Term  0   Preterm  4   AB  1   Living  4     SAB  0   TAB  0   Ectopic  1   Multiple      Live Births  4              Patient Active Problem List   Diagnosis Date Noted  . Chronic nonintractable headache 03/12/2019  . Edema 03/12/2019  . Essential hypertension 02/12/2019  . Swelling of both ankles 02/12/2019  . Cholelithiasis 02/21/2014  . Acute appendicitis 02/20/2014    Past Medical History:  Diagnosis Date  . Hypertension     Past Surgical History:  Procedure Laterality Date  . LAPAROSCOPIC APPENDECTOMY N/A 02/20/2014   Procedure: APPENDECTOMY LAPAROSCOPIC;  Surgeon: Zenovia Jarred, MD;  Location: [facility];  Service: General;  Laterality: N/A;    Current Outpatient Medications  Medication Sig Dispense Refill  . hydrochlorothiazide (HYDRODIURIL) 12.5 MG tablet Take 1 tablet (12.5 mg total) by mouth daily. 90 tablet 1  . ibuprofen (ADVIL) 400 MG tablet Take 1 tablet (400 mg total) by mouth every 6 (six) hours as needed. (Patient taking differently: Take 400 mg by mouth every 6 (six) hours as needed for moderate pain. ) 30 tablet 0   No current facility-administered medications for this visit.      ALLERGIES: Oxycodone  Family History  Problem Relation Age of Onset  . Healthy Mother   . Healthy Father     Social History   Socioeconomic History  . Marital status: Married    Spouse name: Not on file  . Number  of children: Not on file  . Years of education: Not on file  . Highest education level: Not on file  Occupational History  . Not on file  Social Needs  . Financial resource strain: Not on file  . Food insecurity    Worry: Not on file    Inability: Not on file  . Transportation needs    Medical: Not on file    Non-medical: Not on file  Tobacco Use  . Smoking status: Never Smoker  . Smokeless tobacco: Never Used  Substance and Sexual Activity  . Alcohol use: No  . Drug use: No  . Sexual activity: Not Currently    Birth control/protection: None  Lifestyle  . Physical activity    Days per week: Not on file    Minutes per session: Not on file  . Stress: Not on file  Relationships  . Social Herbalist on phone: Not on file    Gets together: Not on file    Attends religious service: Not on file    Active member of club or organization: Not on file    Attends meetings of clubs or organizations: Not on file    Relationship status: Not on file  . Intimate partner violence  Fear of current or ex partner: Not on file    Emotionally abused: Not on file    Physically abused: Not on file    Forced sexual activity: Not on file  Other Topics Concern  . Not on file  Social History Narrative   ** Merged History Encounter **        Review of Systems  Constitutional: Negative.   HENT: Negative.   Eyes: Negative.   Respiratory: Negative.   Cardiovascular: Negative.   Gastrointestinal: Negative.   Genitourinary: Negative.   Musculoskeletal: Negative.   Skin: Negative.   Neurological: Negative.   Endo/Heme/Allergies: Negative.   Psychiatric/Behavioral: Negative.     PHYSICAL EXAMINATION:    BP 140/80 (BP Location: Right Arm, Patient Position: Sitting, Cuff Size: Large)   Pulse 72   Temp 97.6 F (36.4 C) (Oral)   Resp 16   Ht 4\' 9"  (1.448 m)   Wt 188 lb 8 oz (85.5 kg)   LMP 03/30/2019   BMI 40.79 kg/m     General appearance: alert, cooperative and appears  stated age  Pelvic: External genitalia:  no lesions              Urethra:  normal appearing urethra with no masses, tenderness or lesions              Bartholins and Skenes: normal                 Vagina: normal appearing vagina with normal color and discharge, no lesions              Cervix:  no lesions  Sonohysterogram The procedure and risks of the procedure were reviewed with the patient, consent form was signed. A speculum was placed in the vagina and the cervix was cleansed with betadine. The sonohysterogram catheter was inserted into the uterine cavity without difficulty. The speculum was removed and the TV probe was placed in the vagina. Saline was infused under direct observation with the ultrasound. 2 intracavitary defects were noted. C/W endometrial polyps.The catheter and the ultrasound probe were removed.     Chaperone was present for exam.  ASSESSMENT Postmenopausal bleeding, endometrial polyps on sonohysterogram.     PLAN Plan: hysteroscopy, polypectomy, dilation and curettage. Reviewed risks, including: bleeding, infection, uterine perforation Also discussed with her daughter over the phone.    An After Visit Summary was printed and given to the patient.  ~15 minutes face to face time of which over 50% was spent in counseling.

## 2019-05-17 ENCOUNTER — Other Ambulatory Visit: Payer: Self-pay | Admitting: Obstetrics and Gynecology

## 2019-05-17 ENCOUNTER — Encounter: Payer: Self-pay | Admitting: Obstetrics and Gynecology

## 2019-05-17 ENCOUNTER — Ambulatory Visit (INDEPENDENT_AMBULATORY_CARE_PROVIDER_SITE_OTHER): Payer: BC Managed Care – PPO

## 2019-05-17 ENCOUNTER — Other Ambulatory Visit: Payer: Self-pay

## 2019-05-17 ENCOUNTER — Ambulatory Visit: Payer: BC Managed Care – PPO | Admitting: Obstetrics and Gynecology

## 2019-05-17 VITALS — BP 140/80 | HR 72 | Temp 97.6°F | Resp 16 | Ht <= 58 in | Wt 188.5 lb

## 2019-05-17 DIAGNOSIS — N950 Postmenopausal bleeding: Secondary | ICD-10-CM

## 2019-05-17 DIAGNOSIS — N840 Polyp of corpus uteri: Secondary | ICD-10-CM | POA: Diagnosis not present

## 2019-05-17 DIAGNOSIS — R9389 Abnormal findings on diagnostic imaging of other specified body structures: Secondary | ICD-10-CM | POA: Diagnosis not present

## 2019-05-17 NOTE — Patient Instructions (Signed)
You have endometrial polyps

## 2019-05-21 ENCOUNTER — Telehealth: Payer: Self-pay | Admitting: Obstetrics and Gynecology

## 2019-05-21 NOTE — Telephone Encounter (Signed)
Call placed to primary phone number in system, which is for patients daughter, Burtis Junes [phone], she is listed on the most current Designated Party Release. Hassan Rowan, added her mother to the call through conference. Reviewed benefit for recommended surgery. Patient and daughter understood information presented. They are both aware this is the professional benefit only. Patient aware will be contacted by hospital for separate benefits. Patient has confirmed and is ready to proceed with scheduling. Forwarding to Conservation officer, historic buildings for scheduling.   Patient and daughter request a return call to daughter, Loyal Buba number [phone] for scheduling. Upon receiving the call, Hassan Rowan will conference patient in on call.  Routing to Lamont Snowball, RN

## 2019-05-23 NOTE — Telephone Encounter (Signed)
Call to patients daughter Hassan Rowan ( as instructed per patient and indicated on DPR). Discussed option of surgery date of 06-04-2019. Daughter will check with patient and call me back.

## 2019-05-23 NOTE — Telephone Encounter (Signed)
Return call from patients daughter with patient conference call on the line. Both agree to date of 06-04-2019. Surgery instruction sheet reviewed with patient and daughter. Will mail printed copy, letter for work and Covid guidelines.   Routing to Dr Talbert Nan. Encounter closed.

## 2019-05-25 ENCOUNTER — Encounter: Payer: BC Managed Care – PPO | Admitting: Nurse Practitioner

## 2019-05-25 NOTE — H&P (Signed)
Progress Notes by Romualdo BolkJertson, Charnita Trudel Evelyn, MD at 05/17/2019 11:30 AM Author: Romualdo BolkJertson, Atheena Spano Evelyn, MD Author Type: Physician Filed: 05/17/2019 3:44 PM  Note Status: Signed Cosign: Cosign Not Required Encounter Date: 05/17/2019  Editor: Romualdo BolkJertson, Amiera Herzberg Evelyn, MD (Physician)  Prior Versions: 1. Jetta Loutaldwell, Emily T, RN (Registered Nurse) at 05/17/2019 10:43 AM - Sign when Signing Visit   2. Sprague, Caroleen HammanKaitlyn E, RN (Registered Nurse) at 05/16/2019 11:43 AM - Sign when Signing Visit    GYNECOLOGY  VISIT   HPI: 48 y.o.   Married Black or [place] American Not Hispanic or Latino  female   [phone] with Patient's last menstrual period was 03/30/2019.   here for   sonohysterogram for postmenopausal bleeding and a thickened endometrial stripe. FSH 37.9, Pap negative, Endometrial biopsy with atrophy. Prior ultrasound with thickened endometrial stripe of 8 mm.  GYNECOLOGIC HISTORY: Patient's last menstrual period was 03/30/2019. Contraception: none Menopausal hormone therapy: none                OB History    Gravida  5   Para  4   Term  0   Preterm  4   AB  1   Living  4     SAB  0   TAB  0   Ectopic  1   Multiple      Live Births  4                  Patient Active Problem List   Diagnosis Date Noted  . Chronic nonintractable headache 03/12/2019  . Edema 03/12/2019  . Essential hypertension 02/12/2019  . Swelling of both ankles 02/12/2019  . Cholelithiasis 02/21/2014  . Acute appendicitis 02/20/2014        Past Medical History:  Diagnosis Date  . Hypertension          Past Surgical History:  Procedure Laterality Date  . LAPAROSCOPIC APPENDECTOMY N/A 02/20/2014   Procedure: APPENDECTOMY LAPAROSCOPIC;  Surgeon: Liz MaladyBurke E Thompson, MD;  Location: [facility] OR;  Service: General;  Laterality: N/A;          Current Outpatient Medications  Medication Sig Dispense Refill  . hydrochlorothiazide (HYDRODIURIL) 12.5 MG tablet Take 1 tablet (12.5 mg total) by mouth daily.  90 tablet 1  . ibuprofen (ADVIL) 400 MG tablet Take 1 tablet (400 mg total) by mouth every 6 (six) hours as needed. (Patient taking differently: Take 400 mg by mouth every 6 (six) hours as needed for moderate pain. ) 30 tablet 0   No current facility-administered medications for this visit.      ALLERGIES: Oxycodone       Family History  Problem Relation Age of Onset  . Healthy Mother   . Healthy Father     Social History        Socioeconomic History  . Marital status: Married    Spouse name: Not on file  . Number of children: Not on file  . Years of education: Not on file  . Highest education level: Not on file  Occupational History  . Not on file  Social Needs  . Financial resource strain: Not on file  . Food insecurity    Worry: Not on file    Inability: Not on file  . Transportation needs    Medical: Not on file    Non-medical: Not on file  Tobacco Use  . Smoking status: Never Smoker  . Smokeless tobacco: Never Used  Substance and Sexual Activity  . Alcohol  use: No  . Drug use: No  . Sexual activity: Not Currently    Birth control/protection: None  Lifestyle  . Physical activity    Days per week: Not on file    Minutes per session: Not on file  . Stress: Not on file  Relationships  . Social Herbalist on phone: Not on file    Gets together: Not on file    Attends religious service: Not on file    Active member of club or organization: Not on file    Attends meetings of clubs or organizations: Not on file    Relationship status: Not on file  . Intimate partner violence    Fear of current or ex partner: Not on file    Emotionally abused: Not on file    Physically abused: Not on file    Forced sexual activity: Not on file  Other Topics Concern  . Not on file  Social History Narrative   ** Merged History Encounter **        Review of Systems  Constitutional: Negative.   HENT: Negative.    Eyes: Negative.   Respiratory: Negative.   Cardiovascular: Negative.   Gastrointestinal: Negative.   Genitourinary: Negative.   Musculoskeletal: Negative.   Skin: Negative.   Neurological: Negative.   Endo/Heme/Allergies: Negative.   Psychiatric/Behavioral: Negative.     PHYSICAL EXAMINATION:    BP 140/80 (BP Location: Right Arm, Patient Position: Sitting, Cuff Size: Large)   Pulse 72   Temp 97.6 F (36.4 C) (Oral)   Resp 16   Ht 4\' 9"  (1.448 m)   Wt 188 lb 8 oz (85.5 kg)   LMP 03/30/2019   BMI 40.79 kg/m     General appearance: alert, cooperative and appears stated age  Pelvic: External genitalia:  no lesions              Urethra:  normal appearing urethra with no masses, tenderness or lesions              Bartholins and Skenes: normal                 Vagina: normal appearing vagina with normal color and discharge, no lesions              Cervix:  no lesions  Sonohysterogram The procedure and risks of the procedure were reviewed with the patient, consent form was signed. A speculum was placed in the vagina and the cervix was cleansed with betadine. The sonohysterogram catheter was inserted into the uterine cavity without difficulty. The speculum was removed and the TV probe was placed in the vagina. Saline was infused under direct observation with the ultrasound. 2 intracavitary defects were noted. C/W endometrial polyps.The catheter and the ultrasound probe were removed.     Chaperone was present for exam.  ASSESSMENT Postmenopausal bleeding, endometrial polyps on sonohysterogram.     PLAN Plan: hysteroscopy, polypectomy, dilation and curettage. Reviewed risks, including: bleeding, infection, uterine perforation Also discussed with her daughter over the phone.    An After Visit Summary was printed and given to the patient.  ~15 minutes face to face time of which over 50% was spent in counseling.

## 2019-05-31 ENCOUNTER — Other Ambulatory Visit: Payer: Self-pay

## 2019-05-31 ENCOUNTER — Other Ambulatory Visit (HOSPITAL_COMMUNITY)
Admission: RE | Admit: 2019-05-31 | Discharge: 2019-05-31 | Disposition: A | Discharge status: Home / Self Care | Payer: BC Managed Care – PPO | Source: Ambulatory Visit | Attending: Obstetrics and Gynecology | Admitting: Obstetrics and Gynecology

## 2019-05-31 ENCOUNTER — Encounter (HOSPITAL_COMMUNITY)
Admission: RE | Admit: 2019-05-31 | Discharge: 2019-05-31 | Disposition: A | Discharge status: Home / Self Care | Payer: BC Managed Care – PPO | Source: Ambulatory Visit | Attending: Obstetrics and Gynecology | Admitting: Obstetrics and Gynecology

## 2019-05-31 ENCOUNTER — Encounter (HOSPITAL_BASED_OUTPATIENT_CLINIC_OR_DEPARTMENT_OTHER): Payer: Self-pay | Admitting: *Deleted

## 2019-05-31 DIAGNOSIS — Z01818 Encounter for other preprocedural examination: Secondary | ICD-10-CM | POA: Diagnosis present

## 2019-05-31 DIAGNOSIS — Z1159 Encounter for screening for other viral diseases: Secondary | ICD-10-CM | POA: Diagnosis not present

## 2019-05-31 LAB — COMPREHENSIVE METABOLIC PANEL
ALT: 18 U/L (ref 0–44)
AST: 20 U/L (ref 15–41)
Albumin: 3.9 g/dL (ref 3.5–5.0)
Alkaline Phosphatase: 92 U/L (ref 38–126)
Anion gap: 5 (ref 5–15)
BUN: 14 mg/dL (ref 6–20)
CO2: 29 mmol/L (ref 22–32)
Calcium: 9.1 mg/dL (ref 8.9–10.3)
Chloride: 106 mmol/L (ref 98–111)
Creatinine, Ser: 0.69 mg/dL (ref 0.44–1.00)
GFR calc Af Amer: >60 mL/min (ref >60)
GFR calc non Af Amer: >60 mL/min (ref >60)
Glucose, Bld: 87 mg/dL (ref 70–99)
Potassium: 4.1 mmol/L (ref 3.5–5.1)
Sodium: 140 mmol/L (ref 135–145)
Total Bilirubin: 0.6 mg/dL (ref 0.3–1.2)
Total Protein: 7.4 g/dL (ref 6.5–8.1)

## 2019-05-31 LAB — CBC
HCT: 42.2 % (ref 36.0–46.0)
Hemoglobin: 13 g/dL (ref 12.0–15.0)
MCH: 27.8 pg (ref 26.0–34.0)
MCHC: 30.8 g/dL (ref 30.0–36.0)
MCV: 90.2 fL (ref 80.0–100.0)
Platelets: 212 10*3/uL (ref 150–400)
RBC: 4.68 MIL/uL (ref 3.87–5.11)
RDW: 14.6 % (ref 11.5–15.5)
WBC: 5.5 10*3/uL (ref 4.0–10.5)
nRBC: 0 % (ref 0.0–0.2)

## 2019-05-31 LAB — SARS CORONAVIRUS 2 (TAT 6-24 HRS): SARS Coronavirus 2: NEGATIVE

## 2019-05-31 NOTE — Progress Notes (Signed)
Spoke w/ pt face to face for pre-op interview with daughter, brenda, to interpret via phone.  Pt speaks [place] and understands / speak a little english.  Pt and daughter verbalized understanding to be npo after mn and arrive at 0830.  Pt had lab work (cbc, cmp, ekg) and covid test done today.  Pt request only her daughter to be interpreter for [facility] via phone, [phone].

## 2019-05-31 NOTE — Progress Notes (Signed)
SPOKE W/  _  Pt w/ daughter via phone to interpret     [place] 19:   COUGH-- no  RUNNY NOSE---  no  SORE THROAT--- no  NASAL CONGESTION---- no  SNEEZING---- no  SHORTNESS OF BREATH--- no  DIFFICULTY BREATHING--- no  TEMP >100.0 ----- no  UNEXPLAINED BODY ACHES------ no  CHILLS --------  no  HEADACHES --------- no  LOSS OF SMELL/ TASTE -------- no    HAVE YOU OR ANY FAMILY MEMBER TRAVELLED PAST 14 DAYS OUT OF THE   COUNTY--- no STATE---- no COUNTRY---- no  HAVE YOU OR ANY FAMILY MEMBER BEEN EXPOSED TO ANYONE WITH COVID 19?   denies

## 2019-06-04 ENCOUNTER — Ambulatory Visit (HOSPITAL_BASED_OUTPATIENT_CLINIC_OR_DEPARTMENT_OTHER): Payer: BC Managed Care – PPO | Admitting: Anesthesiology

## 2019-06-04 ENCOUNTER — Other Ambulatory Visit: Payer: Self-pay

## 2019-06-04 ENCOUNTER — Ambulatory Visit (HOSPITAL_BASED_OUTPATIENT_CLINIC_OR_DEPARTMENT_OTHER)
Admission: RE | Admit: 2019-06-04 | Discharge: 2019-06-04 | Disposition: A | Discharge status: Home / Self Care | Payer: BC Managed Care – PPO | Attending: Obstetrics and Gynecology | Admitting: Obstetrics and Gynecology

## 2019-06-04 ENCOUNTER — Encounter (HOSPITAL_BASED_OUTPATIENT_CLINIC_OR_DEPARTMENT_OTHER)
Admission: RE | Disposition: A | Discharge status: Home / Self Care | Payer: Self-pay | Source: Home / Self Care | Attending: Obstetrics and Gynecology

## 2019-06-04 ENCOUNTER — Encounter (HOSPITAL_BASED_OUTPATIENT_CLINIC_OR_DEPARTMENT_OTHER): Payer: Self-pay | Admitting: *Deleted

## 2019-06-04 DIAGNOSIS — N950 Postmenopausal bleeding: Secondary | ICD-10-CM | POA: Insufficient documentation

## 2019-06-04 DIAGNOSIS — N840 Polyp of corpus uteri: Secondary | ICD-10-CM

## 2019-06-04 DIAGNOSIS — R9389 Abnormal findings on diagnostic imaging of other specified body structures: Secondary | ICD-10-CM | POA: Insufficient documentation

## 2019-06-04 DIAGNOSIS — Z6841 Body Mass Index (BMI) 40.0 and over, adult: Secondary | ICD-10-CM | POA: Insufficient documentation

## 2019-06-04 DIAGNOSIS — I10 Essential (primary) hypertension: Secondary | ICD-10-CM | POA: Insufficient documentation

## 2019-06-04 HISTORY — DX: Postmenopausal bleeding: N95.0

## 2019-06-04 HISTORY — DX: Polyp of corpus uteri: N84.0

## 2019-06-04 HISTORY — PX: DILATATION & CURETTAGE/HYSTEROSCOPY WITH MYOSURE: SHX6511

## 2019-06-04 HISTORY — DX: Personal history of other complications of pregnancy, childbirth and the puerperium: Z87.59

## 2019-06-04 HISTORY — DX: Abnormal findings on diagnostic imaging of other specified body structures: R93.89

## 2019-06-04 SURGERY — DILATATION & CURETTAGE/HYSTEROSCOPY WITH MYOSURE
Anesthesia: General | Site: Vagina

## 2019-06-04 MED ORDER — DEXAMETHASONE SODIUM PHOSPHATE 10 MG/ML IJ SOLN
INTRAMUSCULAR | Status: AC
  Filled 2019-06-04: qty 1

## 2019-06-04 MED ORDER — LACTATED RINGERS IV SOLN
INTRAVENOUS | Status: DC
  Administered 2019-06-04 (×2): via INTRAVENOUS
  Filled 2019-06-04: qty 1000

## 2019-06-04 MED ORDER — PROPOFOL 10 MG/ML IV BOLUS
INTRAVENOUS | Status: AC
  Filled 2019-06-04: qty 20

## 2019-06-04 MED ORDER — PROPOFOL 10 MG/ML IV BOLUS
INTRAVENOUS | Status: DC | PRN
  Administered 2019-06-04: 170 mg via INTRAVENOUS
  Administered 2019-06-04: 30 mg via INTRAVENOUS

## 2019-06-04 MED ORDER — SODIUM CHLORIDE 0.9 % IV SOLN
INTRAVENOUS | Status: AC | PRN
  Administered 2019-06-04: 3000 mL via INTRAMUSCULAR

## 2019-06-04 MED ORDER — ONDANSETRON HCL 4 MG/2ML IJ SOLN
INTRAMUSCULAR | Status: AC
  Filled 2019-06-04: qty 2

## 2019-06-04 MED ORDER — ACETAMINOPHEN 500 MG PO TABS
ORAL_TABLET | ORAL | Status: AC
  Filled 2019-06-04: qty 1

## 2019-06-04 MED ORDER — FENTANYL CITRATE (PF) 100 MCG/2ML IJ SOLN
25.0000 ug | INTRAMUSCULAR | Status: DC | PRN
  Administered 2019-06-04 (×2): 25 ug via INTRAVENOUS
  Filled 2019-06-04: qty 1

## 2019-06-04 MED ORDER — FENTANYL CITRATE (PF) 100 MCG/2ML IJ SOLN
INTRAMUSCULAR | Status: DC | PRN
  Administered 2019-06-04: 50 ug via INTRAVENOUS
  Administered 2019-06-04: 25 ug via INTRAVENOUS

## 2019-06-04 MED ORDER — MIDAZOLAM HCL 5 MG/5ML IJ SOLN
INTRAMUSCULAR | Status: DC | PRN
  Administered 2019-06-04: 2 mg via INTRAVENOUS

## 2019-06-04 MED ORDER — LACTATED RINGERS IV SOLN
INTRAVENOUS | Status: DC
  Filled 2019-06-04: qty 1000

## 2019-06-04 MED ORDER — LIDOCAINE HCL (CARDIAC) PF 100 MG/5ML IV SOSY
PREFILLED_SYRINGE | INTRAVENOUS | Status: DC | PRN
  Administered 2019-06-04: 40 mg via INTRAVENOUS

## 2019-06-04 MED ORDER — LIDOCAINE 2% (20 MG/ML) 5 ML SYRINGE
INTRAMUSCULAR | Status: AC
  Filled 2019-06-04: qty 5

## 2019-06-04 MED ORDER — KETOROLAC TROMETHAMINE 30 MG/ML IJ SOLN
INTRAMUSCULAR | Status: DC | PRN
  Administered 2019-06-04: 30 mg via INTRAVENOUS

## 2019-06-04 MED ORDER — ACETAMINOPHEN 500 MG PO TABS
1000.0000 mg | ORAL_TABLET | Freq: Once | ORAL | Status: AC
  Administered 2019-06-04: 14:00:00 1000 mg via ORAL
  Filled 2019-06-04: qty 2

## 2019-06-04 MED ORDER — MIDAZOLAM HCL 2 MG/2ML IJ SOLN
INTRAMUSCULAR | Status: AC
  Filled 2019-06-04: qty 2

## 2019-06-04 MED ORDER — DEXAMETHASONE SODIUM PHOSPHATE 10 MG/ML IJ SOLN
INTRAMUSCULAR | Status: DC | PRN
  Administered 2019-06-04: 10 mg via INTRAVENOUS

## 2019-06-04 MED ORDER — ONDANSETRON HCL 4 MG/2ML IJ SOLN
INTRAMUSCULAR | Status: DC | PRN
  Administered 2019-06-04: 4 mg via INTRAVENOUS

## 2019-06-04 MED ORDER — SODIUM CHLORIDE (PF) 0.9 % IJ SOLN
INTRAMUSCULAR | Status: AC
  Filled 2019-06-04: qty 50

## 2019-06-04 MED ORDER — KETOROLAC TROMETHAMINE 30 MG/ML IJ SOLN
INTRAMUSCULAR | Status: AC
  Filled 2019-06-04: qty 1

## 2019-06-04 MED ORDER — FENTANYL CITRATE (PF) 100 MCG/2ML IJ SOLN
INTRAMUSCULAR | Status: AC
  Filled 2019-06-04: qty 2

## 2019-06-04 MED ORDER — ONDANSETRON HCL 4 MG/2ML IJ SOLN
4.0000 mg | Freq: Once | INTRAMUSCULAR | Status: DC | PRN
  Filled 2019-06-04: qty 2

## 2019-06-04 SURGICAL SUPPLY — 23 items
CANISTER SUCT 3000ML PPV (MISCELLANEOUS) ×6 IMPLANT
CATH ROBINSON RED A/P 16FR (CATHETERS) IMPLANT
COVER WAND RF STERILE (DRAPES) IMPLANT
DEVICE MYOSURE LITE (MISCELLANEOUS) IMPLANT
DEVICE MYOSURE REACH (MISCELLANEOUS) ×3 IMPLANT
DILATOR CANAL MILEX (MISCELLANEOUS) IMPLANT
GAUZE 4X4 16PLY RFD (DISPOSABLE) ×3 IMPLANT
GLOVE BIO SURGEON STRL SZ 6.5 (GLOVE) ×2 IMPLANT
GLOVE BIO SURGEONS STRL SZ 6.5 (GLOVE) ×1
GOWN STRL REUS W/TWL LRG LVL3 (GOWN DISPOSABLE) ×3 IMPLANT
IV NS IRRIG 3000ML ARTHROMATIC (IV SOLUTION) ×3 IMPLANT
KIT PROCEDURE FLUENT (KITS) ×3 IMPLANT
KIT TURNOVER CYSTO (KITS) IMPLANT
MYOSURE XL FIBROID (MISCELLANEOUS)
PACK VAGINAL MINOR WOMEN LF (CUSTOM PROCEDURE TRAY) ×3 IMPLANT
PAD OB MATERNITY 4.3X12.25 (PERSONAL CARE ITEMS) ×3 IMPLANT
PAD PREP 24X48 CUFFED NSTRL (MISCELLANEOUS) ×3 IMPLANT
SEAL CERVICAL OMNI LOK (ABLATOR) IMPLANT
SEAL ROD LENS SCOPE MYOSURE (ABLATOR) ×3 IMPLANT
SYR 20CC LL (SYRINGE) IMPLANT
SYSTEM TISS REMOVAL MYOSURE XL (MISCELLANEOUS) IMPLANT
TOWEL OR 17X26 10 PK STRL BLUE (TOWEL DISPOSABLE) ×6 IMPLANT
WATER STERILE IRR 500ML POUR (IV SOLUTION) IMPLANT

## 2019-06-04 NOTE — Interval H&P Note (Signed)
History and Physical Interval Note:  06/04/2019 10:23 AM  Margaret Singleton  has presented today for surgery, with the diagnosis of PMB, thickened endometrium, endometrial polyps.  The various methods of treatment have been discussed with the patient and family. After consideration of risks, benefits and other options for treatment, the patient has consented to  Procedure(s) with comments: [place] (N/A) - follow previous case as a surgical intervention.  The patient's history has been reviewed, patient examined, no change in status, stable for surgery.  I have reviewed the patient's chart and labs.  Questions were answered to the patient's satisfaction.     Salvadore Dom

## 2019-06-04 NOTE — Transfer of Care (Signed)
Immediate Anesthesia Transfer of Care Note  Patient: Arelia Sneddon  Procedure(s) Performed: DILATATION & CURETTAGE/HYSTEROSCOPY WITH MYOSURE (N/A Vagina )  Patient Location: PACU  Anesthesia Type:General  Level of Consciousness: awake, alert , oriented and patient cooperative  Airway & Oxygen Therapy: Patient Spontanous Breathing and Patient connected to nasal cannula oxygen  Post-op Assessment: Report given to RN and Post -op Vital signs reviewed and stable  Post vital signs: Reviewed and stable  Last Vitals:  Vitals Value Taken Time  BP    Temp    Pulse 82 06/04/19 1121  Resp 21 06/04/19 1121  SpO2 96 % 06/04/19 1121  Vitals shown include unvalidated device data.  Last Pain:  Vitals:   06/04/19 0800  TempSrc: Oral         Complications: No apparent anesthesia complications

## 2019-06-04 NOTE — Anesthesia Procedure Notes (Signed)
Procedure Name: LMA Insertion Date/Time: 06/04/2019 10:44 AM Performed by: Wanita Chamberlain, CRNA Pre-anesthesia Checklist: Patient identified, Emergency Drugs available, Suction available and Patient being monitored Patient Re-evaluated:Patient Re-evaluated prior to induction Oxygen Delivery Method: Circle system utilized Induction Type: IV induction Ventilation: Mask ventilation without difficulty LMA: LMA inserted LMA Size: 4.0 Number of attempts: 1 Placement Confirmation: breath sounds checked- equal and bilateral,  CO2 detector and positive ETCO2 Tube secured with: Tape Dental Injury: Teeth and Oropharynx as per pre-operative assessment

## 2019-06-04 NOTE — Op Note (Signed)
Preoperative Diagnosis: Postmenopausal bleeding, endometrial polyp  Postoperative Diagnosis: Same  Procedure: Hysteroscopy, polypectomy, dilation and curettage  Surgeon: Dr Sumner Boast  Assistants: None  Anesthesia: General via LMA  EBL: minimal  Fluids: 1,000 cc LR  Fluid deficit: 60 cc  Urine output: not recorded  Indications for surgery: The patient is a 48 yo female, who presented with postmenopausal bleeding. Work up included an [place] of 37.9, a negative pap, endometrial biopsy with atrophy, ultrasound with a thickened stripe of 8 mm, sonohysterogram with intrauterine defects consistent with endometrial polyps.  The risks of the surgery were reviewed with the patient and the consent form was signed prior to her surgery.  Findings: EUA: normal sized uterus, no adnexal masses. Hysteroscopy: 2 endometrial polyps, thickened endometrium posteriorly, normal tubal ostia bilaterally  Specimens: Endometrial polyps, endometrial curettage   Procedure: The patient was taken to the operating room with an IV in place. She was placed in the dorsal lithotomy position and anesthesia was administered. She was prepped and draped in the usual sterile fashion for a vaginal procedure. She voided on the way to the OR. A weighted speculum was placed in the vagina and a single tooth tenaculum was placed on the anterior lip of the cervix. The cervix was dilated to a #6.5 hagar dilator. The uterus was sounded to 7 cm. The myosure hysteroscope was inserted into the uterine cavity. With continuous infusion of normal saline, the uterine cavity was visualized with the above findings. The myosure reach was used to resect the polyps and thickened endometrium. The myosure was then removed. The cavity was then curetted with the small sharp curette. The cavity had the characteristically gritty texture at the end of the procedure. The curette and the single tooth tenaculum were removed. Oozing from the tenaculum site was  stopped with pressure. The speculum was removed. The patients perineum was cleansed of betadine and she was taken out of the dorsal lithotomy position.  Upon awakening the LMA was removed and the patient was transferred to the recovery room in stable and awake condition.  The sponge and instrument count were correct. There were no complications.

## 2019-06-04 NOTE — Anesthesia Postprocedure Evaluation (Signed)
Anesthesia Post Note  Patient: Margaret Singleton  Procedure(s) Performed: DILATATION & CURETTAGE/HYSTEROSCOPY WITH MYOSURE (N/A Vagina )     Patient location during evaluation: PACU Anesthesia Type: General Level of consciousness: awake and alert Pain management: pain level controlled Vital Signs Assessment: post-procedure vital signs reviewed and stable Respiratory status: spontaneous breathing, nonlabored ventilation, respiratory function stable and patient connected to nasal cannula oxygen Cardiovascular status: blood pressure returned to baseline and stable Postop Assessment: no apparent nausea or vomiting Anesthetic complications: no    Last Vitals:  Vitals:   06/04/19 1300 06/04/19 1315  BP: 114/76   Pulse: 67 65  Resp: 19 20  Temp:    SpO2: 92% 91%    Last Pain:  Vitals:   06/04/19 1230  TempSrc:   PainSc: Tyler Deis

## 2019-06-04 NOTE — Anesthesia Preprocedure Evaluation (Signed)
Anesthesia Evaluation  Patient identified by MRN, date of birth, ID band Patient awake    Reviewed: Allergy & Precautions, NPO status , Patient's Chart, lab work & pertinent test results  Airway Mallampati: II  TM Distance: >3 FB     Dental  (+) Dental Advisory Given   Pulmonary neg pulmonary ROS,    breath sounds clear to auscultation       Cardiovascular hypertension, Pt. on medications  Rhythm:Regular Rate:Normal     Neuro/Psych  Headaches,    GI/Hepatic negative GI ROS, Neg liver ROS,   Endo/Other  Morbid obesity  Renal/GU negative Renal ROS     Musculoskeletal   Abdominal   Peds  Hematology negative hematology ROS (+)   Anesthesia Other Findings   Reproductive/Obstetrics                             Anesthesia Physical Anesthesia Plan  ASA: III  Anesthesia Plan: General   Post-op Pain Management:    Induction: Intravenous  PONV Risk Score and Plan: 3 and Dexamethasone, Ondansetron, Treatment may vary due to age or medical condition and Midazolam  Airway Management Planned: LMA  Additional Equipment: None  Intra-op Plan:   Post-operative Plan: Extubation in OR  Informed Consent: I have reviewed the patients History and Physical, chart, labs and discussed the procedure including the risks, benefits and alternatives for the proposed anesthesia with the patient or authorized representative who has indicated his/her understanding and acceptance.     Dental advisory given  Plan Discussed with: CRNA  Anesthesia Plan Comments:         Anesthesia Quick Evaluation

## 2019-06-04 NOTE — Discharge Instructions (Signed)
DISCHARGE INSTRUCTIONS: D&C / D&E The following instructions have been prepared to help you care for yourself upon your return home.   Personal hygiene:  Use sanitary pads for vaginal drainage, not tampons.  Shower the day after your procedure.  NO tub baths, pools or Jacuzzis for 2-3 weeks.  Wipe front to back after using the bathroom.  Activity and limitations:  Do NOT drive or operate any equipment for 24 hours. The effects of anesthesia are still present and drowsiness may result.  Do NOT rest in bed all day.  Walking is encouraged.  Walk up and down stairs slowly.  You may resume your normal activity in one to two days or as indicated by your physician.  Sexual activity: NO intercourse for at least 2 weeks after the procedure, or as indicated by your physician.  Diet: Eat a light meal as desired this evening. You may resume your usual diet tomorrow.  Return to work: You may resume your work activities in one to two days or as indicated by your doctor.  What to expect after your surgery: Expect to have vaginal bleeding/discharge for 2-3 days and spotting for up to 10 days. It is not unusual to have soreness for up to 1-2 weeks. You may have a slight burning sensation when you urinate for the first day. Mild cramps may continue for a couple of days. You may have a regular period in 2-6 weeks.  Call your doctor for any of the following:  Excessive vaginal bleeding, saturating and changing one pad every hour.  Inability to urinate 6 hours after discharge from hospital.  Pain not relieved by pain medication.  Fever of 100.4 F or greater.  Unusual vaginal discharge or odor.   Call for an appointment: as instructed   Post Anesthesia Home Care Instructions  Activity: Get plenty of rest for the remainder of the day. A responsible individual must stay with you for 24 hours following the procedure.  For the next 24 hours, DO NOT: -Drive a car -Conservation officer, nature -Drink alcoholic beverages -Take any medication unless instructed by your physician -Make any legal decisions or sign important papers.  Meals: Start with liquid foods such as gelatin or soup. Progress to regular foods as tolerated. Avoid greasy, spicy, heavy foods. If nausea and/or vomiting occur, drink only clear liquids until the nausea and/or vomiting subsides. Call your physician if vomiting continues.  Special Instructions/Symptoms: Your throat may feel dry or sore from the anesthesia or the breathing tube placed in your throat during surgery. If this causes discomfort, gargle with warm salt water. The discomfort should disappear within 24 hours.  If you had a scopolamine patch placed behind your ear for the management of post- operative nausea and/or vomiting:  1. The medication in the patch is effective for 72 hours, after which it should be removed.  Wrap patch in a tissue and discard in the trash. Wash hands thoroughly with soap and water. 2. You may remove the patch earlier than 72 hours if you experience unpleasant side effects which may include dry mouth, dizziness or visual disturbances. 3. Avoid touching the patch. Wash your hands with soap and water after contact with the patch.

## 2019-06-05 ENCOUNTER — Encounter (HOSPITAL_BASED_OUTPATIENT_CLINIC_OR_DEPARTMENT_OTHER): Payer: Self-pay | Admitting: Obstetrics and Gynecology

## 2019-06-05 NOTE — Addendum Note (Signed)
Addendum  created 06/05/19 1254 by Bonney Aid, CRNA   Charge Capture section accepted

## 2019-06-05 NOTE — Addendum Note (Signed)
Addendum  created 06/05/19 1256 by Justice Rocher, CRNA   Charge Capture section accepted, Intraprocedure Event edited

## 2019-06-06 ENCOUNTER — Other Ambulatory Visit: Payer: Self-pay

## 2019-06-06 ENCOUNTER — Encounter: Payer: Self-pay | Admitting: Nurse Practitioner

## 2019-06-06 ENCOUNTER — Ambulatory Visit: Payer: BC Managed Care – PPO | Admitting: Nurse Practitioner

## 2019-06-06 VITALS — BP 124/80 | HR 61 | Temp 98.2°F | Ht <= 58 in | Wt 192.8 lb

## 2019-06-06 DIAGNOSIS — E559 Vitamin D deficiency, unspecified: Secondary | ICD-10-CM | POA: Diagnosis not present

## 2019-06-06 DIAGNOSIS — Z0000 Encounter for general adult medical examination without abnormal findings: Secondary | ICD-10-CM

## 2019-06-06 DIAGNOSIS — I10 Essential (primary) hypertension: Secondary | ICD-10-CM

## 2019-06-06 DIAGNOSIS — Z1239 Encounter for other screening for malignant neoplasm of breast: Secondary | ICD-10-CM | POA: Diagnosis not present

## 2019-06-06 LAB — POCT URINALYSIS DIPSTICK
Bilirubin, UA: NEGATIVE
Glucose, UA: NEGATIVE
Ketones, UA: NEGATIVE
Leukocytes, UA: NEGATIVE
Nitrite, UA: NEGATIVE
Protein, UA: NEGATIVE
Spec Grav, UA: 1.025 (ref 1.010–1.025)
Urobilinogen, UA: 1 E.U./dL
pH, UA: 7 (ref 5.0–8.0)

## 2019-06-06 LAB — POCT UA - MICROALBUMIN
Albumin/Creatinine Ratio, Urine, POC: 30
Creatinine, POC: 200 mg/dL
Microalbumin Ur, POC: 10 mg/L

## 2019-06-06 NOTE — Progress Notes (Signed)
Subjective:     Patient ID: Margaret GrumblingElizabeth Singleton , female    DOB: May 06, 1971 , 48 y.o.   MRN: [id number]   Chief Complaint  Patient presents with  . Annual Exam   The patient states she uses none for birth control. Last LMP was Patient's last menstrual period was 03/30/2019.. Negative for Dysmenorrhea and Negative for Menorrhagia Mammogram never had done. Negative for: breast discharge, breast lump(s), breast pain and breast self exam.  Pertinent negatives include abnormal bleeding (hematology), anxiety, decreased libido, depression, difficulty falling sleep, dyspareunia, history of infertility, nocturia, sexual dysfunction, sleep disturbances, urinary incontinence, urinary urgency, vaginal discharge and vaginal itching. Diet regular.The patient states her exercise level is  minimal - recent surgery on 06/04/2019    . The patient's tobacco use is:  Social History   Tobacco Use  Smoking Status Never Smoker  Smokeless Tobacco Never Used   She has been exposed to passive smoke. The patient's alcohol use is:  Social History   Substance and Sexual Activity  Alcohol Use No  Additional information: Last pap 05/10/2019 , next one scheduled for 05/09/2022.   HPI  Here for HM.      Hypertension This is a chronic problem. The current episode started more than 1 year ago. The problem has been gradually improving since onset. The problem is controlled. Pertinent negatives include no anxiety.     Past Medical History:  Diagnosis Date  . Endometrial polyp   . History of diverticulitis of colon 02/2017  . History of ectopic pregnancy   . Hypertension   . PMB (postmenopausal bleeding)   . Thickened endometrium      Family History  Problem Relation Age of Onset  . Healthy Mother   . Healthy Father     .   Current Outpatient Medications:  .  hydrochlorothiazide (HYDRODIURIL) 12.5 MG tablet, Take 1 tablet (12.5 mg total) by mouth daily. (Patient taking differently: Take 12.5 mg by mouth  daily. ), Disp: 90 tablet, Rfl: 1 .  ibuprofen (ADVIL) 400 MG tablet, Take 1 tablet (400 mg total) by mouth every 6 (six) hours as needed. (Patient not taking: Reported on 06/06/2019), Disp: 30 tablet, Rfl: 0   Allergies  Allergen Reactions  . Oxycodone Itching     Review of Systems  Constitutional: Negative.   HENT: Negative.   Eyes: Negative.   Respiratory: Negative.   Cardiovascular: Negative.   Gastrointestinal: Negative.   Endocrine: Negative.   Genitourinary:       Recent surgery for fibroids removal  Musculoskeletal: Negative.   Skin: Negative.   Allergic/Immunologic: Negative.   Neurological: Negative.   Hematological: Negative.   Psychiatric/Behavioral: Negative.      Today's Vitals   06/06/19 1140  BP: 124/80  Pulse: 61  Temp: 98.2 F (36.8 C)  TempSrc: Oral  Weight: 192 lb 12.8 oz (87.5 kg)  Height: 4' 9.4" (1.458 m)  PainSc: 8    Body mass index is 41.14 kg/m.   Objective:  Physical Exam Constitutional:      Appearance: Normal appearance. She is well-developed.  HENT:     Head: Normocephalic and atraumatic.     Right Ear: Hearing, tympanic membrane, ear canal and external ear normal. There is no impacted cerumen.     Left Ear: Hearing, tympanic membrane, ear canal and external ear normal. There is no impacted cerumen.  Eyes:     General: Lids are normal.     Extraocular Movements: Extraocular movements intact.     Conjunctiva/sclera:  Conjunctivae normal.     Pupils: Pupils are equal, round, and reactive to light.     Funduscopic exam:    Right eye: No papilledema.        Left eye: No papilledema.  Neck:     Musculoskeletal: Full passive range of motion without pain, normal range of motion and neck supple.     Thyroid: No thyroid mass.     Vascular: No carotid bruit.  Cardiovascular:     Rate and Rhythm: Normal rate and regular rhythm.     Pulses: Normal pulses.     Heart sounds: Normal heart sounds. No murmur.  Pulmonary:     Effort:  Pulmonary effort is normal.     Breath sounds: Normal breath sounds.  Abdominal:     General: Abdomen is flat. Bowel sounds are normal.     Palpations: Abdomen is soft. There is no mass.     Comments: Abdomen is tender on palpation throughout - 2 days post D&C and hysteroscopy, polyp removmal  Musculoskeletal: Normal range of motion.        General: No swelling.     Right lower leg: No edema.     Left lower leg: No edema.  Skin:    General: Skin is warm and dry.     Capillary Refill: Capillary refill takes less than 2 seconds.  Neurological:     General: No focal deficit present.     Mental Status: She is alert and oriented to person, place, and time.     Cranial Nerves: No cranial nerve deficit.     Sensory: No sensory deficit.  Psychiatric:        Mood and Affect: Mood normal.        Behavior: Behavior normal.        Thought Content: Thought content normal.        Judgment: Judgment normal.         Assessment And Plan:     1. Health maintenance examination . Behavior modifications discussed and diet history reviewed.   . Pt will continue to exercise regularly and modify diet with low GI, plant based foods and decrease intake of processed foods.  . Recommend intake of daily multivitamin, Vitamin D, and calcium.  . Recommend mammogram for preventive screenings, as well as recommend immunizations that include  TDAP  2. Encounter for screening for malignant neoplasm of breast  Pt instructed on Self Breast Exam.According to ACOG guidelines Women aged 87 and older are recommended to get an annual mammogram. Form completed and given to patient contact the [facility] for appointment scheduing.   Pt encouraged to get annual mammogram - MM Digital Screening; Future  3. Essential hypertension . B/P is well controlled.  . CMP ordered to check renal function.  . The importance of regular exercise and dietary modification was stressed to the patient.  . Encouraged to increase  her physical activity when she is cleared by her GYN . Continue with current medications - POCT Urinalysis Dipstick (81002) - POCT UA - Microalbumin - BMP8+Anion Gap - Lipid Profile - CBC no Diff  4. Vitamin D deficiency Will check vitamin D level and supplement as needed.    Also encouraged to spend 15 minutes in the sun daily.  - Vitamin D (25 hydroxy) - Vitamin D, Ergocalciferol, (DRISDOL) 1.25 MG (50000 UT) CAPS capsule; Take 1 capsule (50,000 Units total) by mouth 2 (two) times a week.  Dispense: 24 capsule; Refill: 0  Brittyn Salaz, FNP    THE PATIENT IS ENCOURAGED TO PRACTICE SOCIAL DISTANCING DUE TO THE COVID-19 PANDEMIC.   

## 2019-06-07 ENCOUNTER — Telehealth: Payer: Self-pay

## 2019-06-07 LAB — CBC
Hematocrit: 37.8 % (ref 34.0–46.6)
Hemoglobin: 12.3 g/dL (ref 11.1–15.9)
MCH: 28.1 pg (ref 26.6–33.0)
MCHC: 32.5 g/dL (ref 31.5–35.7)
MCV: 87 fL (ref 79–97)
Platelets: 217 10*3/uL (ref 150–450)
RBC: 4.37 x10E6/uL (ref 3.77–5.28)
RDW: 14.3 % (ref 11.7–15.4)
WBC: 11.4 10*3/uL — ABNORMAL HIGH (ref 3.4–10.8)

## 2019-06-07 LAB — BMP8+ANION GAP
Anion Gap: 12 mmol/L (ref 10.0–18.0)
BUN/Creatinine Ratio: 23 (ref 9–23)
BUN: 19 mg/dL (ref 6–24)
CO2: 27 mmol/L (ref 20–29)
Calcium: 9 mg/dL (ref 8.7–10.2)
Chloride: 104 mmol/L (ref 96–106)
Creatinine, Ser: 0.84 mg/dL (ref 0.57–1.00)
GFR calc Af Amer: 94 mL/min/{1.73_m2} (ref >59)
GFR calc non Af Amer: 82 mL/min/{1.73_m2} (ref >59)
Glucose: 74 mg/dL (ref 65–99)
Potassium: 4 mmol/L (ref 3.5–5.2)
Sodium: 143 mmol/L (ref 134–144)

## 2019-06-07 LAB — LIPID PANEL
Chol/HDL Ratio: 3.4 ratio (ref 0.0–4.4)
Cholesterol, Total: 169 mg/dL (ref 100–199)
HDL: 50 mg/dL (ref >39)
LDL Calculated: 96 mg/dL (ref 0–99)
Triglycerides: 113 mg/dL (ref 0–149)
VLDL Cholesterol Cal: 23 mg/dL (ref 5–40)

## 2019-06-07 LAB — VITAMIN D 25 HYDROXY (VIT D DEFICIENCY, FRACTURES): Vit D, 25-Hydroxy: 17.9 ng/mL — ABNORMAL LOW (ref 30.0–100.0)

## 2019-06-07 MED ORDER — VITAMIN D (ERGOCALCIFEROL) 1.25 MG (50000 UNIT) PO CAPS: 50000.0000 [IU] | ORAL_CAPSULE | ORAL | 0 refills | Status: DC

## 2019-06-07 NOTE — Telephone Encounter (Signed)
Attempted to reach patient at number provided. There was no answer and recording states that the voicemail box is full. 

## 2019-06-07 NOTE — Telephone Encounter (Signed)
-----   Message from Margaret Dom, MD sent at 06/06/2019  4:05 PM EDT ----- Please let the patient know that her pathology results are benign. She still has hormonal stimulation to the lining of her uterus. I think she is really perimenopausal not postmenopausal. When she comes for her post op I will discuss treatment options to prevent her from having further issues.

## 2019-06-19 NOTE — Telephone Encounter (Signed)
Notes recorded by Bethannie Iglehart, Harley Hallmark, RN on 06/14/2019 at 8:39 AM EDT  Spoke with patient and patient's daughter, okay per patient. Advised of results and recommendations.

## 2019-06-19 NOTE — Progress Notes (Signed)
GYNECOLOGY  VISIT   HPI: 48 y.o.   Married Black or [place] American Not Hispanic or Latino  female   [phone] with Patient's last menstrual period was 03/30/2019.   here for  2 week post op s/p hysteroscopy, polypectomy, D&C. Her pathology returned with a benign polyp and proliferative endometrium. Bleeding has stopped. She was having some lower abdominal discomfort, but it is improving. States she is mainly uncomfortable with bending and getting on the floor and getting up. Her husband is in [place], she is asking about the possiblity of pregnancy (they haven't had any children together).    GYNECOLOGIC HISTORY: Patient's last menstrual period was 03/30/2019. Contraception:none PMP Menopausal hormone therapy:         OB History    Gravida  5   Para  4   Term  0   Preterm  4   AB  1   Living  4     SAB  0   TAB  0   Ectopic  1   Multiple      Live Births  4              Patient Active Problem List   Diagnosis Date Noted  . Chronic nonintractable headache 03/12/2019  . Edema 03/12/2019  . Essential hypertension 02/12/2019  . Swelling of both ankles 02/12/2019  . Cholelithiasis 02/21/2014  . Acute appendicitis 02/20/2014    Past Medical History:  Diagnosis Date  . Endometrial polyp   . History of diverticulitis of colon 02/2017  . History of ectopic pregnancy   . Hypertension   . PMB (postmenopausal bleeding)   . Thickened endometrium     Past Surgical History:  Procedure Laterality Date  . DILATATION & CURETTAGE/HYSTEROSCOPY WITH MYOSURE N/A 06/04/2019   Procedure: DILATATION & CURETTAGE/HYSTEROSCOPY WITH MYOSURE;  Surgeon: Salvadore Dom, MD;  Location: [facility];  Service: Gynecology;  Laterality: N/A;  follow previous case  . ECTOPIC PREGNANCY SURGERY  early 2000s  in [place]  . LAPAROSCOPIC APPENDECTOMY N/A 02/20/2014   Procedure: APPENDECTOMY LAPAROSCOPIC;  Surgeon: Zenovia Jarred, MD;  Location: [place];  Service: General;   Laterality: N/A;    Current Outpatient Medications  Medication Sig Dispense Refill  . hydrochlorothiazide (HYDRODIURIL) 12.5 MG tablet Take 1 tablet (12.5 mg total) by mouth daily. (Patient taking differently: Take 12.5 mg by mouth daily. ) 90 tablet 1  . ibuprofen (ADVIL) 400 MG tablet Take 1 tablet (400 mg total) by mouth every 6 (six) hours as needed. (Patient not taking: Reported on 06/06/2019) 30 tablet 0  . Vitamin D, Ergocalciferol, (DRISDOL) 1.25 MG (50000 UT) CAPS capsule Take 1 capsule (50,000 Units total) by mouth 2 (two) times a week. 24 capsule 0   No current facility-administered medications for this visit.      ALLERGIES: Oxycodone  Family History  Problem Relation Age of Onset  . Healthy Mother   . Healthy Father     Social History   Socioeconomic History  . Marital status: Married    Spouse name: Not on file  . Number of children: Not on file  . Years of education: Not on file  . Highest education level: Not on file  Occupational History  . Not on file  Social Needs  . Financial resource strain: Not on file  . Food insecurity    Worry: Not on file    Inability: Not on file  . Transportation needs    Medical: Not on file  Non-medical: Not on file  Tobacco Use  . Smoking status: Never Smoker  . Smokeless tobacco: Never Used  Substance and Sexual Activity  . Alcohol use: No  . Drug use: Never  . Sexual activity: Not Currently    Birth control/protection: None  Lifestyle  . Physical activity    Days per week: Not on file    Minutes per session: Not on file  . Stress: Not on file  Relationships  . Social Musicianconnections    Talks on phone: Not on file    Gets together: Not on file    Attends religious service: Not on file    Active member of club or organization: Not on file    Attends meetings of clubs or organizations: Not on file    Relationship status: Not on file  . Intimate partner violence    Fear of current or ex partner: Not on file     Emotionally abused: Not on file    Physically abused: Not on file    Forced sexual activity: Not on file  Other Topics Concern  . Not on file  Social History Narrative   ** Merged History Encounter **        Review of Systems  All other systems reviewed and are negative.   PHYSICAL EXAMINATION:    LMP 03/30/2019     General appearance: alert, cooperative and appears stated age Abdomen: soft, non-tender; non distended, no masses,  no organomegaly   ASSESSMENT S/P hysteroscopy, polypectomy, D&C for PMP bleeding. Pathology with benign polyp and proliferative endometrium. We discussed that she still has hormonal stimulation to the lining of her uterus She is desiring pregnancy, I told her that it would be highly unlikely for her to get pregnant on her own.    PLAN Start cyclic provera F/u with Dr April MansonYalcinkaya to discuss fertility (we briefly discussed donor egg) F/U here in 6/21 for an annual exam.    An After Visit Summary was printed and given to the patient.  ~15 minutes face to face time of which over 50% was spent in counseling.

## 2019-06-20 ENCOUNTER — Ambulatory Visit (INDEPENDENT_AMBULATORY_CARE_PROVIDER_SITE_OTHER): Payer: BC Managed Care – PPO | Admitting: Obstetrics and Gynecology

## 2019-06-20 ENCOUNTER — Encounter: Payer: Self-pay | Admitting: Obstetrics and Gynecology

## 2019-06-20 ENCOUNTER — Other Ambulatory Visit: Payer: Self-pay

## 2019-06-20 VITALS — BP 110/74 | HR 74 | Temp 97.9°F | Ht <= 58 in | Wt 191.0 lb

## 2019-06-20 DIAGNOSIS — N950 Postmenopausal bleeding: Secondary | ICD-10-CM | POA: Diagnosis not present

## 2019-06-20 DIAGNOSIS — Z9889 Other specified postprocedural states: Secondary | ICD-10-CM

## 2019-06-20 MED ORDER — MEDROXYPROGESTERONE ACETATE 5 MG PO TABS: ORAL_TABLET | ORAL | 1 refills | Status: DC

## 2019-06-20 NOTE — Patient Instructions (Signed)
You should take one provera (medoxyprogesterone) tablet for 5 days every other month, until no bleeding for 6 months. Start in September. The prescription has 15 tablets and one refill, you only take 5 tablets every 2 months.

## 2019-06-22 DIAGNOSIS — E559 Vitamin D deficiency, unspecified: Secondary | ICD-10-CM | POA: Insufficient documentation

## 2019-06-22 NOTE — Patient Instructions (Signed)
Health Maintenance, Female Adopting a healthy lifestyle and getting preventive care are important in promoting health and wellness. Ask your health care provider about:  The right schedule for you to have regular tests and exams.  Things you can do on your own to prevent diseases and keep yourself healthy. What should I know about diet, weight, and exercise? Eat a healthy diet   Eat a diet that includes plenty of vegetables, fruits, low-fat dairy products, and lean protein.  Do not eat a lot of foods that are high in solid fats, added sugars, or sodium. Maintain a healthy weight Body mass index (BMI) is used to identify weight problems. It estimates body fat based on height and weight. Your health care provider can help determine your BMI and help you achieve or maintain a healthy weight. Get regular exercise Get regular exercise. This is one of the most important things you can do for your health. Most adults should:  Exercise for at least 150 minutes each week. The exercise should increase your heart rate and make you sweat (moderate-intensity exercise).  Do strengthening exercises at least twice a week. This is in addition to the moderate-intensity exercise.  Spend less time sitting. Even light physical activity can be beneficial. Watch cholesterol and blood lipids Have your blood tested for lipids and cholesterol at 49 years of age, then have this test every 5 years. Have your cholesterol levels checked more often if:  Your lipid or cholesterol levels are high.  You are older than 49 years of age.  You are at high risk for heart disease. What should I know about cancer screening? Depending on your health history and family history, you may need to have cancer screening at various ages. This may include screening for:  Breast cancer.  Cervical cancer.  Colorectal cancer.  Skin cancer.  Lung cancer. What should I know about heart disease, diabetes, and high blood  pressure? Blood pressure and heart disease  High blood pressure causes heart disease and increases the risk of stroke. This is more likely to develop in people who have high blood pressure readings, are of African descent, or are overweight.  Have your blood pressure checked: ? Every 3-5 years if you are 18-39 years of age. ? Every year if you are 40 years old or older. Diabetes Have regular diabetes screenings. This checks your fasting blood sugar level. Have the screening done:  Once every three years after age 40 if you are at a normal weight and have a low risk for diabetes.  More often and at a younger age if you are overweight or have a high risk for diabetes. What should I know about preventing infection? Hepatitis B If you have a higher risk for hepatitis B, you should be screened for this virus. Talk with your health care provider to find out if you are at risk for hepatitis B infection. Hepatitis C Testing is recommended for:  Everyone born from 1945 through 1965.  Anyone with known risk factors for hepatitis C. Sexually transmitted infections (STIs)  Get screened for STIs, including gonorrhea and chlamydia, if: ? You are sexually active and are younger than 49 years of age. ? You are older than 49 years of age and your health care provider tells you that you are at risk for this type of infection. ? Your sexual activity has changed since you were last screened, and you are at increased risk for chlamydia or gonorrhea. Ask your health care provider if   you are at risk.  Ask your health care provider about whether you are at high risk for HIV. Your health care provider may recommend a prescription medicine to help prevent HIV infection. If you choose to take medicine to prevent HIV, you should first get tested for HIV. You should then be tested every 3 months for as long as you are taking the medicine. Pregnancy  If you are about to stop having your period (premenopausal) and  you may become pregnant, seek counseling before you get pregnant.  Take 400 to 800 micrograms (mcg) of folic acid every day if you become pregnant.  Ask for birth control (contraception) if you want to prevent pregnancy. Osteoporosis and menopause Osteoporosis is a disease in which the bones lose minerals and strength with aging. This can result in bone fractures. If you are 65 years old or older, or if you are at risk for osteoporosis and fractures, ask your health care provider if you should:  Be screened for bone loss.  Take a calcium or vitamin D supplement to lower your risk of fractures.  Be given hormone replacement therapy (HRT) to treat symptoms of menopause. Follow these instructions at home: Lifestyle  Do not use any products that contain nicotine or tobacco, such as cigarettes, e-cigarettes, and chewing tobacco. If you need help quitting, ask your health care provider.  Do not use street drugs.  Do not share needles.  Ask your health care provider for help if you need support or information about quitting drugs. Alcohol use  Do not drink alcohol if: ? Your health care provider tells you not to drink. ? You are pregnant, may be pregnant, or are planning to become pregnant.  If you drink alcohol: ? Limit how much you use to 0-1 drink a day. ? Limit intake if you are breastfeeding.  Be aware of how much alcohol is in your drink. In the [place] one drink equals one 12 oz bottle of beer (355 mL), one 5 oz glass of wine (148 mL), or one 1 oz glass of hard liquor (44 mL). General instructions  Schedule regular health, dental, and eye exams.  Stay current with your vaccines.  Tell your health care provider if: ? You often feel depressed. ? You have ever been abused or do not feel safe at home. Summary  Adopting a healthy lifestyle and getting preventive care are important in promoting health and wellness.  Follow your health care provider's instructions about healthy  diet, exercising, and getting tested or screened for diseases.  Follow your health care provider's instructions on monitoring your cholesterol and blood pressure. This information is not intended to replace advice given to you by your health care provider. Make sure you discuss any questions you have with your health care provider. Document Released: 05/31/2011 Document Revised: 11/08/2018 Document Reviewed: 11/08/2018 Elsevier Patient Education  2020 Elsevier Inc.  

## 2019-08-14 ENCOUNTER — Other Ambulatory Visit: Payer: Self-pay

## 2019-08-14 ENCOUNTER — Ambulatory Visit
Admission: RE | Admit: 2019-08-14 | Discharge: 2019-08-14 | Disposition: A | Discharge status: Home / Self Care | Payer: PRIVATE HEALTH INSURANCE | Source: Ambulatory Visit | Attending: Nurse Practitioner | Admitting: Nurse Practitioner

## 2019-08-14 DIAGNOSIS — Z1239 Encounter for other screening for malignant neoplasm of breast: Secondary | ICD-10-CM

## 2019-09-12 ENCOUNTER — Ambulatory Visit: Payer: BC Managed Care – PPO | Admitting: Nurse Practitioner

## 2020-03-26 ENCOUNTER — Ambulatory Visit: Payer: BC Managed Care – PPO | Admitting: Nurse Practitioner

## 2020-03-26 ENCOUNTER — Other Ambulatory Visit: Payer: Self-pay

## 2020-03-26 ENCOUNTER — Encounter: Payer: Self-pay | Admitting: Nurse Practitioner

## 2020-03-26 VITALS — BP 140/78 | HR 82 | Temp 98.7°F | Ht <= 58 in | Wt 196.0 lb

## 2020-03-26 DIAGNOSIS — Z9114 Patient's other noncompliance with medication regimen: Secondary | ICD-10-CM | POA: Diagnosis not present

## 2020-03-26 DIAGNOSIS — Z1211 Encounter for screening for malignant neoplasm of colon: Secondary | ICD-10-CM

## 2020-03-26 DIAGNOSIS — R519 Headache, unspecified: Secondary | ICD-10-CM | POA: Diagnosis not present

## 2020-03-26 DIAGNOSIS — I10 Essential (primary) hypertension: Secondary | ICD-10-CM | POA: Diagnosis not present

## 2020-03-26 MED ORDER — MAGNESIUM 200 MG PO TABS: ORAL_TABLET | ORAL | 1 refills | Status: DC

## 2020-03-26 MED ORDER — HYDROCHLOROTHIAZIDE 12.5 MG PO TABS: 12.5000 mg | ORAL_TABLET | Freq: Every day | ORAL | 1 refills | Status: DC

## 2020-03-26 NOTE — Progress Notes (Signed)
This visit occurred during the SARS-CoV-2 public health emergency.  Safety protocols were in place, including screening questions prior to the visit, additional usage of staff PPE, and extensive cleaning of exam room while observing appropriate contact time as indicated for disinfecting solutions.  Subjective:     Patient ID: Margaret Singleton , female    DOB: 1971/09/19 , 49 y.o.   MRN: [id number]   Chief Complaint  Patient presents with  . Hypertension    patient stated she has been without her medicine for a few months now     HPI  Hypertension This is a chronic problem. The current episode started more than 1 year ago. The problem is controlled. Associated symptoms include headaches (sharp pain to the front of ). Pertinent negatives include no anxiety, blurred vision, chest pain or palpitations. Risk factors for coronary artery disease include obesity and sedentary lifestyle. There are no compliance problems.  There is no history of kidney disease.     Past Medical History:  Diagnosis Date  . Endometrial polyp   . History of diverticulitis of colon 02/2017  . History of ectopic pregnancy   . Hypertension   . PMB (postmenopausal bleeding)   . Thickened endometrium      Family History  Problem Relation Age of Onset  . Healthy Mother   . Healthy Father      Current Outpatient Medications:  .  hydrochlorothiazide (HYDRODIURIL) 12.5 MG tablet, Take 1 tablet (12.5 mg total) by mouth daily. (Patient not taking: Reported on 03/26/2020), Disp: 90 tablet, Rfl: 1 .  ibuprofen (ADVIL) 400 MG tablet, Take 1 tablet (400 mg total) by mouth every 6 (six) hours as needed. (Patient not taking: Reported on 06/06/2019), Disp: 30 tablet, Rfl: 0 .  medroxyPROGESTERone (PROVERA) 5 MG tablet, Take one tablet po qd x 5 days every other month until you don't have any bleeding for 6 months. First dose in the beginning of September. (Patient not taking: Reported on 03/26/2020), Disp: 15 tablet, Rfl: 1 .   Vitamin D, Ergocalciferol, (DRISDOL) 1.25 MG (50000 UT) CAPS capsule, Take 1 capsule (50,000 Units total) by mouth 2 (two) times a week. (Patient not taking: Reported on 03/26/2020), Disp: 24 capsule, Rfl: 0   Allergies  Allergen Reactions  . Oxycodone Itching     Review of Systems  Constitutional: Negative.   Eyes: Negative for blurred vision.  Respiratory: Negative.   Cardiovascular: Negative.  Negative for chest pain, palpitations and leg swelling.  Neurological: Positive for headaches (sharp pain to the front of ). Negative for dizziness.  Psychiatric/Behavioral: Negative.      Today's Vitals   03/26/20 1140  BP: 140/78  Pulse: 82  Temp: 98.7 F (37.1 C)  TempSrc: Oral  SpO2: 96%  Weight: 196 lb (88.9 kg)  Height: 4' 9.4" (1.458 m)  PainSc: 5   PainLoc: Head   Body mass index is 41.82 kg/m.   Objective:  Physical Exam Vitals reviewed.  Constitutional:      General: She is not in acute distress.    Appearance: Normal appearance. She is well-developed. She is obese.  Eyes:     Extraocular Movements: Extraocular movements intact.     Conjunctiva/sclera: Conjunctivae normal.  Cardiovascular:     Rate and Rhythm: Normal rate and regular rhythm.     Pulses: Normal pulses.     Heart sounds: Normal heart sounds. No murmur.  Pulmonary:     Effort: Pulmonary effort is normal. No respiratory distress.  Breath sounds: Normal breath sounds.  Skin:    Capillary Refill: Capillary refill takes less than 2 seconds.  Neurological:     General: No focal deficit present.     Mental Status: She is alert and oriented to person, place, and time.     Cranial Nerves: No cranial nerve deficit.  Psychiatric:        Mood and Affect: Mood normal.        Behavior: Behavior normal.        Thought Content: Thought content normal.        Judgment: Judgment normal.         Assessment And Plan:     1. Essential hypertension  Chronic, fairly controlled  She has not taken her  medications in about 3 months  Will refill today for HCTZ - hydrochlorothiazide (HYDRODIURIL) 12.5 MG tablet; Take 1 tablet (12.5 mg total) by mouth daily.  Dispense: 90 tablet; Refill: 1 - Magnesium 200 MG TABS; Take 1 tablet by mouth daily with evening meal.  Dispense: 90 tablet; Refill: 1 - BMP8+eGFR  2. Morning headache  This can be concerning for sleep apnea  Will refer for a sleep study  She is also to take magnesium daily - Magnesium 200 MG TABS; Take 1 tablet by mouth daily with evening meal.  Dispense: 90 tablet; Refill: 1  3. Encounter for screening colonoscopy  According to USPTF Colorectal cancer Screening guidelines. Colonoscopy is recommended every 10 years, starting at age 11years.  Will refer to GI for colon cancer screening. - Ambulatory referral to Gastroenterology   Minette Brine, FNP    THE PATIENT IS ENCOURAGED TO PRACTICE SOCIAL DISTANCING DUE TO THE COVID-19 PANDEMIC.

## 2020-03-27 LAB — BMP8+EGFR
BUN/Creatinine Ratio: 18 (ref 9–23)
BUN: 14 mg/dL (ref 6–24)
CO2: 26 mmol/L (ref 20–29)
Calcium: 9.1 mg/dL (ref 8.7–10.2)
Chloride: 103 mmol/L (ref 96–106)
Creatinine, Ser: 0.8 mg/dL (ref 0.57–1.00)
GFR calc Af Amer: 99 mL/min/{1.73_m2} (ref >59)
GFR calc non Af Amer: 86 mL/min/{1.73_m2} (ref >59)
Glucose: 84 mg/dL (ref 65–99)
Potassium: 4.2 mmol/L (ref 3.5–5.2)
Sodium: 141 mmol/L (ref 134–144)

## 2020-04-08 ENCOUNTER — Other Ambulatory Visit: Payer: Self-pay | Admitting: Nurse Practitioner

## 2020-04-08 MED ORDER — MEFLOQUINE HCL 250 MG PO TABS: ORAL_TABLET | ORAL | 0 refills | Status: DC

## 2020-04-23 ENCOUNTER — Encounter (HOSPITAL_COMMUNITY): Payer: Self-pay | Admitting: Emergency Medicine

## 2020-04-23 ENCOUNTER — Ambulatory Visit (HOSPITAL_COMMUNITY)
Admission: EM | Admit: 2020-04-23 | Discharge: 2020-04-23 | Disposition: A | Discharge status: Home / Self Care | Payer: BC Managed Care – PPO | Attending: Emergency Medicine | Admitting: Emergency Medicine

## 2020-04-23 DIAGNOSIS — Z1152 Encounter for screening for COVID-19: Secondary | ICD-10-CM

## 2020-04-23 NOTE — Discharge Instructions (Signed)

## 2020-04-23 NOTE — ED Provider Notes (Signed)
[place]   MRN: [id number] DOB: Feb 25, 1971  Subjective:   Margaret Singleton is a 49 y.o. female presenting for COVID-19 testing.  Will be traveling to [place] and is required to get testing within 72 hours.  She did complete her Covid vaccination, last dose was 02/26/2020.  No current facility-administered medications for this encounter.  Current Outpatient Medications:  .  hydrochlorothiazide (HYDRODIURIL) 12.5 MG tablet, Take 1 tablet (12.5 mg total) by mouth daily., Disp: 90 tablet, Rfl: 1 .  Magnesium 200 MG TABS, Take 1 tablet by mouth daily with evening meal., Disp: 90 tablet, Rfl: 1 .  mefloquine (LARIAM) 250 MG tablet, Take 1 tablet a week for 2 weeks prior to travel then take 1 tablet by mouth for one week while at destination then take 1 tablet by mouth weekly for 4 weeks upon returning, Disp: 8 tablet, Rfl: 0 .  ibuprofen (ADVIL) 400 MG tablet, Take 1 tablet (400 mg total) by mouth every 6 (six) hours as needed. (Patient not taking: Reported on 06/06/2019), Disp: 30 tablet, Rfl: 0 .  Vitamin D, Ergocalciferol, (DRISDOL) 1.25 MG (50000 UT) CAPS capsule, Take 1 capsule (50,000 Units total) by mouth 2 (two) times a week. (Patient not taking: Reported on 03/26/2020), Disp: 24 capsule, Rfl: 0   Allergies  Allergen Reactions  . Oxycodone Itching    Past Medical History:  Diagnosis Date  . Endometrial polyp   . History of diverticulitis of colon 02/2017  . History of ectopic pregnancy   . Hypertension   . PMB (postmenopausal bleeding)   . Thickened endometrium      Past Surgical History:  Procedure Laterality Date  . DILATATION & CURETTAGE/HYSTEROSCOPY WITH MYOSURE N/A 06/04/2019   Procedure: DILATATION & CURETTAGE/HYSTEROSCOPY WITH MYOSURE;  Surgeon: Salvadore Dom, MD;  Location: [facility];  Service: Gynecology;  Laterality: N/A;  follow previous case  . ECTOPIC PREGNANCY SURGERY  early 2000s  in [place]  . LAPAROSCOPIC APPENDECTOMY N/A 02/20/2014    Procedure: APPENDECTOMY LAPAROSCOPIC;  Surgeon: Zenovia Jarred, MD;  Location: [facility] OR;  Service: General;  Laterality: N/A;    Family History  Problem Relation Age of Onset  . Healthy Mother   . Healthy Father     Social History   Tobacco Use  . Smoking status: Never Smoker  . Smokeless tobacco: Never Used  Substance Use Topics  . Alcohol use: No  . Drug use: Never    Review of Systems  Constitutional: Negative for fever and malaise/fatigue.  HENT: Negative for congestion, ear pain, sinus pain and sore throat.   Eyes: Negative for blurred vision, double vision, discharge and redness.  Respiratory: Negative for cough, hemoptysis, shortness of breath and wheezing.   Cardiovascular: Negative for chest pain.  Gastrointestinal: Negative for abdominal pain, diarrhea, nausea and vomiting.  Genitourinary: Negative for dysuria, flank pain and hematuria.  Musculoskeletal: Negative for myalgias.  Skin: Negative for rash.  Neurological: Negative for dizziness, weakness and headaches.  Psychiatric/Behavioral: Negative for depression and substance abuse.     Objective:   Vitals: BP 133/83 (BP Location: Left Arm)   Pulse 81   Temp 98.4 F (36.9 C) (Oral)   Resp 16   LMP 03/30/2019   SpO2 97%   Physical Exam Constitutional:      General: She is not in acute distress.    Appearance: Normal appearance. She is well-developed. She is not ill-appearing, toxic-appearing or diaphoretic.  HENT:     Head: Normocephalic and atraumatic.  Nose: Nose normal.     Mouth/Throat:     Mouth: Mucous membranes are moist.     Pharynx: Oropharynx is clear.  Eyes:     General: No scleral icterus.    Extraocular Movements: Extraocular movements intact.     Pupils: Pupils are equal, round, and reactive to light.  Cardiovascular:     Rate and Rhythm: Normal rate.  Pulmonary:     Effort: Pulmonary effort is normal.  Skin:    General: Skin is warm and dry.  Neurological:     General: No  focal deficit present.     Mental Status: She is alert and oriented to person, place, and time.  Psychiatric:        Mood and Affect: Mood normal.        Behavior: Behavior normal.        Thought Content: Thought content normal.        Judgment: Judgment normal.      Assessment and Plan :   PDMP not reviewed this encounter.  1. Encounter for screening for COVID-19     Counseled patient on nature of COVID-19 including modes of transmission, diagnostic testing, management and supportive care.  Counseled on medications used for symptomatic relief. COVID 19 testing is pending. Counseled patient on potential for adverse effects with medications prescribed/recommended today, ER and return-to-clinic precautions discussed, patient verbalized understanding.     Wallis Bamberg, [place] 04/23/20 [phone]

## 2020-04-23 NOTE — ED Triage Notes (Signed)
Pt is traveling to [place] on 5/28 and needs a covid test. She has no symptoms.

## 2020-04-24 LAB — SARS CORONAVIRUS 2 (TAT 6-24 HRS): SARS Coronavirus 2: NEGATIVE

## 2020-05-06 NOTE — Progress Notes (Deleted)
49 y.o. [id number] Married Black or African American Not Hispanic or Latino female here for annual exam.      Patient's last menstrual period was 03/30/2019.          Sexually active: {yes no:314532}  The current method of family planning is {contraception:315051}.    Exercising: {yes no:314532}  {types:19826} Smoker:  {YES NO:22349}  Health Maintenance: Pap:  05/10/19 WNL HPV Neg  History of abnormal Pap:  {YES NO:22349} MMG:  Never   BMD:   Never  Colonoscopy: Never  TDaP:  *** Gardasil: NA   reports that she has never smoked. She has never used smokeless tobacco. She reports that she does not drink alcohol or use drugs.  Past Medical History:  Diagnosis Date  . Endometrial polyp   . History of diverticulitis of colon 02/2017  . History of ectopic pregnancy   . Hypertension   . PMB (postmenopausal bleeding)   . Thickened endometrium     Past Surgical History:  Procedure Laterality Date  . DILATATION & CURETTAGE/HYSTEROSCOPY WITH MYOSURE N/A 06/04/2019   Procedure: DILATATION & CURETTAGE/HYSTEROSCOPY WITH MYOSURE;  Surgeon: Salvadore Dom, MD;  Location: [facility];  Service: Gynecology;  Laterality: N/A;  follow previous case  . ECTOPIC PREGNANCY SURGERY  early 2000s  in [place]  . LAPAROSCOPIC APPENDECTOMY N/A 02/20/2014   Procedure: APPENDECTOMY LAPAROSCOPIC;  Surgeon: Zenovia Jarred, MD;  Location: [facility];  Service: General;  Laterality: N/A;    Current Outpatient Medications  Medication Sig Dispense Refill  . hydrochlorothiazide (HYDRODIURIL) 12.5 MG tablet Take 1 tablet (12.5 mg total) by mouth daily. 90 tablet 1  . ibuprofen (ADVIL) 400 MG tablet Take 1 tablet (400 mg total) by mouth every 6 (six) hours as needed. (Patient not taking: Reported on 06/06/2019) 30 tablet 0  . Magnesium 200 MG TABS Take 1 tablet by mouth daily with evening meal. 90 tablet 1  . mefloquine (LARIAM) 250 MG tablet Take 1 tablet a week for 2 weeks prior to travel then take 1  tablet by mouth for one week while at destination then take 1 tablet by mouth weekly for 4 weeks upon returning 8 tablet 0  . Vitamin D, Ergocalciferol, (DRISDOL) 1.25 MG (50000 UT) CAPS capsule Take 1 capsule (50,000 Units total) by mouth 2 (two) times a week. (Patient not taking: Reported on 03/26/2020) 24 capsule 0   No current facility-administered medications for this visit.    Family History  Problem Relation Age of Onset  . Healthy Mother   . Healthy Father     Review of Systems  Exam:   LMP 03/30/2019   Weight change: @WEIGHTCHANGE @ Height:      Ht Readings from Last 3 Encounters:  03/26/20 4' 9.4" (1.458 m)  06/20/19 4\' 9"  (1.448 m)  06/06/19 4' 9.4" (1.458 m)    General appearance: alert, cooperative and appears stated age Head: Normocephalic, without obvious abnormality, atraumatic Neck: no adenopathy, supple, symmetrical, trachea midline and thyroid {CHL AMB PHY EX THYROID NORM DEFAULT:[id number]::"normal to inspection and palpation"} Lungs: clear to auscultation bilaterally Cardiovascular: regular rate and rhythm Breasts: {Exam; breast:13139::"normal appearance, no masses or tenderness"} Abdomen: soft, non-tender; non distended,  no masses,  no organomegaly Extremities: extremities normal, atraumatic, no cyanosis or edema Skin: Skin color, texture, turgor normal. No rashes or lesions Lymph nodes: Cervical, supraclavicular, and axillary nodes normal. No abnormal inguinal nodes palpated Neurologic: Grossly normal   Pelvic: External genitalia:  no lesions  Urethra:  normal appearing urethra with no masses, tenderness or lesions              Bartholins and Skenes: normal                 Vagina: normal appearing vagina with normal color and discharge, no lesions              Cervix: {CHL AMB PHY EX CERVIX NORM DEFAULT:[phone]::"no lesions"}               Bimanual Exam:  Uterus:  {CHL AMB PHY EX UTERUS NORM DEFAULT:[phone]::"normal size, contour,  position, consistency, mobility, non-tender"}              Adnexa: {CHL AMB PHY EX ADNEXA NO MASS DEFAULT:[phone]::"no mass, fullness, tenderness"}               Rectovaginal: Confirms               Anus:  normal sphincter tone, no lesions  *** chaperoned for the exam.  A:  Well Woman with normal exam  P:

## 2020-05-07 ENCOUNTER — Ambulatory Visit: Payer: BC Managed Care – PPO | Admitting: Obstetrics and Gynecology

## 2020-06-10 NOTE — Progress Notes (Deleted)
  This visit occurred during the SARS-CoV-2 public health emergency.  Safety protocols were in place, including screening questions prior to the visit, additional usage of staff PPE, and extensive cleaning of exam room while observing appropriate contact time as indicated for disinfecting solutions.  Subjective:     Patient ID: Margaret Singleton , female    DOB: 10/12/1971 , 49 y.o.   MRN: [id number]   Chief Complaint  Patient presents with  . Annual Exam    HPI  HPI   Past Medical History:  Diagnosis Date  . Endometrial polyp   . History of diverticulitis of colon 02/2017  . History of ectopic pregnancy   . Hypertension   . PMB (postmenopausal bleeding)   . Thickened endometrium      Family History  Problem Relation Age of Onset  . Healthy Mother   . Healthy Father      Current Outpatient Medications:  .  hydrochlorothiazide (HYDRODIURIL) 12.5 MG tablet, Take 1 tablet (12.5 mg total) by mouth daily., Disp: 90 tablet, Rfl: 1 .  ibuprofen (ADVIL) 400 MG tablet, Take 1 tablet (400 mg total) by mouth every 6 (six) hours as needed. (Patient not taking: Reported on 06/06/2019), Disp: 30 tablet, Rfl: 0 .  Magnesium 200 MG TABS, Take 1 tablet by mouth daily with evening meal., Disp: 90 tablet, Rfl: 1 .  mefloquine (LARIAM) 250 MG tablet, Take 1 tablet a week for 2 weeks prior to travel then take 1 tablet by mouth for one week while at destination then take 1 tablet by mouth weekly for 4 weeks upon returning, Disp: 8 tablet, Rfl: 0 .  Vitamin D, Ergocalciferol, (DRISDOL) 1.25 MG (50000 UT) CAPS capsule, Take 1 capsule (50,000 Units total) by mouth 2 (two) times a week. (Patient not taking: Reported on 03/26/2020), Disp: 24 capsule, Rfl: 0   Allergies  Allergen Reactions  . Oxycodone Itching     Review of Systems   There were no vitals filed for this visit. There is no height or weight on file to calculate BMI.   Objective:  Physical Exam      Assessment And Plan:     1.  Encounter for general adult medical examination w/o abnormal findings ***  2. Essential hypertension *** - POCT Urinalysis Dipstick (81002) - POCT UA - Microalbumin - EKG 12-Lead      ***  Patient was given opportunity to ask questions. Patient verbalized understanding of the plan and was able to repeat key elements of the plan. All questions were answered to their satisfaction.  [address] [facility], CMA   I, [place], [place], have reviewed all documentation for this visit. The documentation on 06/10/20 for the exam, diagnosis, procedures, and orders are all accurate and complete.  THE PATIENT IS ENCOURAGED TO PRACTICE SOCIAL DISTANCING DUE TO THE COVID-19 PANDEMIC.

## 2020-06-11 ENCOUNTER — Encounter: Payer: BC Managed Care – PPO | Admitting: Nurse Practitioner

## 2020-09-15 ENCOUNTER — Encounter: Payer: Self-pay | Admitting: Nurse Practitioner

## 2020-09-15 LAB — HM COLONOSCOPY

## 2020-09-17 ENCOUNTER — Encounter: Payer: Self-pay | Admitting: Nurse Practitioner

## 2020-10-19 ENCOUNTER — Encounter (HOSPITAL_COMMUNITY): Payer: Self-pay | Admitting: Emergency Medicine

## 2020-10-19 ENCOUNTER — Other Ambulatory Visit: Payer: Self-pay

## 2020-10-19 ENCOUNTER — Inpatient Hospital Stay (HOSPITAL_COMMUNITY)
Admission: EM | Admit: 2020-10-19 | Discharge: 2020-10-23 | DRG: 418 | Disposition: A | Discharge status: Home / Self Care | Payer: BC Managed Care – PPO | Attending: Physician Assistant | Admitting: Physician Assistant

## 2020-10-19 DIAGNOSIS — K821 Hydrops of gallbladder: Secondary | ICD-10-CM | POA: Diagnosis present

## 2020-10-19 DIAGNOSIS — R1011 Right upper quadrant pain: Secondary | ICD-10-CM

## 2020-10-19 DIAGNOSIS — Z90710 Acquired absence of both cervix and uterus: Secondary | ICD-10-CM

## 2020-10-19 DIAGNOSIS — R109 Unspecified abdominal pain: Secondary | ICD-10-CM | POA: Diagnosis present

## 2020-10-19 DIAGNOSIS — K8012 Calculus of gallbladder with acute and chronic cholecystitis without obstruction: Principal | ICD-10-CM | POA: Diagnosis present

## 2020-10-19 DIAGNOSIS — Z20822 Contact with and (suspected) exposure to covid-19: Secondary | ICD-10-CM | POA: Diagnosis present

## 2020-10-19 DIAGNOSIS — T7849XA Other allergy, initial encounter: Secondary | ICD-10-CM | POA: Diagnosis not present

## 2020-10-19 DIAGNOSIS — I10 Essential (primary) hypertension: Secondary | ICD-10-CM | POA: Diagnosis present

## 2020-10-19 DIAGNOSIS — X58XXXA Exposure to other specified factors, initial encounter: Secondary | ICD-10-CM | POA: Diagnosis not present

## 2020-10-19 DIAGNOSIS — Z79899 Other long term (current) drug therapy: Secondary | ICD-10-CM

## 2020-10-19 LAB — URINALYSIS, ROUTINE W REFLEX MICROSCOPIC
Bilirubin Urine: NEGATIVE
Glucose, UA: NEGATIVE mg/dL
Hgb urine dipstick: NEGATIVE
Ketones, ur: NEGATIVE mg/dL
Leukocytes,Ua: NEGATIVE
Nitrite: NEGATIVE
Protein, ur: NEGATIVE mg/dL
Specific Gravity, Urine: 1.021 (ref 1.005–1.030)
pH: 7 (ref 5.0–8.0)

## 2020-10-19 LAB — COMPREHENSIVE METABOLIC PANEL
ALT: 18 U/L (ref 0–44)
AST: 18 U/L (ref 15–41)
Albumin: 3.6 g/dL (ref 3.5–5.0)
Alkaline Phosphatase: 92 U/L (ref 38–126)
Anion gap: 7 (ref 5–15)
BUN: 10 mg/dL (ref 6–20)
CO2: 26 mmol/L (ref 22–32)
Calcium: 9.1 mg/dL (ref 8.9–10.3)
Chloride: 105 mmol/L (ref 98–111)
Creatinine, Ser: 0.79 mg/dL (ref 0.44–1.00)
GFR, Estimated: >60 mL/min (ref >60)
Glucose, Bld: 114 mg/dL — ABNORMAL HIGH (ref 70–99)
Potassium: 3.7 mmol/L (ref 3.5–5.1)
Sodium: 138 mmol/L (ref 135–145)
Total Bilirubin: 0.7 mg/dL (ref 0.3–1.2)
Total Protein: 8 g/dL (ref 6.5–8.1)

## 2020-10-19 LAB — CBC
HCT: 43.5 % (ref 36.0–46.0)
Hemoglobin: 13.7 g/dL (ref 12.0–15.0)
MCH: 28.1 pg (ref 26.0–34.0)
MCHC: 31.5 g/dL (ref 30.0–36.0)
MCV: 89.1 fL (ref 80.0–100.0)
Platelets: 205 10*3/uL (ref 150–400)
RBC: 4.88 MIL/uL (ref 3.87–5.11)
RDW: 13.9 % (ref 11.5–15.5)
WBC: 6.5 10*3/uL (ref 4.0–10.5)
nRBC: 0 % (ref 0.0–0.2)

## 2020-10-19 LAB — I-STAT BETA HCG BLOOD, ED (MC, WL, AP ONLY): I-stat hCG, quantitative: <5 m[IU]/mL (ref <5)

## 2020-10-19 LAB — LIPASE, BLOOD: Lipase: 45 U/L (ref 11–51)

## 2020-10-19 NOTE — ED Triage Notes (Signed)
Pt to ED via [facility] with c/o epigastric pain, nausea without vomiting.  Onset yesterday morning

## 2020-10-20 ENCOUNTER — Observation Stay (HOSPITAL_COMMUNITY): Payer: BC Managed Care – PPO

## 2020-10-20 ENCOUNTER — Emergency Department (HOSPITAL_COMMUNITY): Payer: BC Managed Care – PPO

## 2020-10-20 ENCOUNTER — Encounter (HOSPITAL_COMMUNITY): Payer: Self-pay | Admitting: Family Medicine

## 2020-10-20 ENCOUNTER — Other Ambulatory Visit: Payer: Self-pay

## 2020-10-20 DIAGNOSIS — I10 Essential (primary) hypertension: Secondary | ICD-10-CM

## 2020-10-20 DIAGNOSIS — R109 Unspecified abdominal pain: Secondary | ICD-10-CM | POA: Diagnosis present

## 2020-10-20 DIAGNOSIS — R1011 Right upper quadrant pain: Secondary | ICD-10-CM | POA: Diagnosis not present

## 2020-10-20 LAB — COMPREHENSIVE METABOLIC PANEL
ALT: 20 U/L (ref 0–44)
AST: 21 U/L (ref 15–41)
Albumin: 3.5 g/dL (ref 3.5–5.0)
Alkaline Phosphatase: 86 U/L (ref 38–126)
Anion gap: 10 (ref 5–15)
BUN: 9 mg/dL (ref 6–20)
CO2: 25 mmol/L (ref 22–32)
Calcium: 9.3 mg/dL (ref 8.9–10.3)
Chloride: 103 mmol/L (ref 98–111)
Creatinine, Ser: 0.73 mg/dL (ref 0.44–1.00)
GFR, Estimated: >60 mL/min (ref >60)
Glucose, Bld: 118 mg/dL — ABNORMAL HIGH (ref 70–99)
Potassium: 4.2 mmol/L (ref 3.5–5.1)
Sodium: 138 mmol/L (ref 135–145)
Total Bilirubin: 0.7 mg/dL (ref 0.3–1.2)
Total Protein: 8.3 g/dL — ABNORMAL HIGH (ref 6.5–8.1)

## 2020-10-20 LAB — CBC
HCT: 44.1 % (ref 36.0–46.0)
Hemoglobin: 13.8 g/dL (ref 12.0–15.0)
MCH: 28 pg (ref 26.0–34.0)
MCHC: 31.3 g/dL (ref 30.0–36.0)
MCV: 89.6 fL (ref 80.0–100.0)
Platelets: 227 10*3/uL (ref 150–400)
RBC: 4.92 MIL/uL (ref 3.87–5.11)
RDW: 14.2 % (ref 11.5–15.5)
WBC: 7.6 10*3/uL (ref 4.0–10.5)
nRBC: 0 % (ref 0.0–0.2)

## 2020-10-20 LAB — HIV ANTIBODY (ROUTINE TESTING W REFLEX): HIV Screen 4th Generation wRfx: NONREACTIVE

## 2020-10-20 LAB — SURGICAL PCR SCREEN
MRSA, PCR: NEGATIVE
Staphylococcus aureus: NEGATIVE

## 2020-10-20 LAB — RESPIRATORY PANEL BY RT PCR (FLU A&B, COVID)
Influenza A by PCR: NEGATIVE
Influenza B by PCR: NEGATIVE
SARS Coronavirus 2 by RT PCR: NEGATIVE

## 2020-10-20 MED ORDER — TECHNETIUM TC 99M MEBROFENIN IV KIT
5.5000 | PACK | Freq: Once | INTRAVENOUS | Status: AC | PRN
  Administered 2020-10-20: 5.5 via INTRAVENOUS

## 2020-10-20 MED ORDER — ONDANSETRON HCL 4 MG/2ML IJ SOLN
4.0000 mg | Freq: Four times a day (QID) | INTRAMUSCULAR | Status: DC | PRN
  Administered 2020-10-22: 4 mg via INTRAVENOUS
  Filled 2020-10-20: qty 2

## 2020-10-20 MED ORDER — PANTOPRAZOLE SODIUM 40 MG IV SOLR
40.0000 mg | INTRAVENOUS | Status: DC
  Administered 2020-10-20 – 2020-10-22 (×2): 40 mg via INTRAVENOUS
  Filled 2020-10-20 (×2): qty 40

## 2020-10-20 MED ORDER — ONDANSETRON HCL 4 MG/2ML IJ SOLN
4.0000 mg | Freq: Once | INTRAMUSCULAR | Status: AC
  Administered 2020-10-20: 4 mg via INTRAVENOUS
  Filled 2020-10-20: qty 2

## 2020-10-20 MED ORDER — DEXTROSE-NACL 5-0.9 % IV SOLN: INTRAVENOUS | Status: DC

## 2020-10-20 MED ORDER — METRONIDAZOLE IN NACL 5-0.79 MG/ML-% IV SOLN
500.0000 mg | Freq: Three times a day (TID) | INTRAVENOUS | Status: DC
  Administered 2020-10-20 – 2020-10-21 (×4): 500 mg via INTRAVENOUS
  Filled 2020-10-20 (×4): qty 100

## 2020-10-20 MED ORDER — KETOROLAC TROMETHAMINE 30 MG/ML IJ SOLN
30.0000 mg | Freq: Four times a day (QID) | INTRAMUSCULAR | Status: DC | PRN
  Administered 2020-10-21: 30 mg via INTRAVENOUS
  Filled 2020-10-20: qty 1

## 2020-10-20 MED ORDER — FENTANYL CITRATE (PF) 100 MCG/2ML IJ SOLN
25.0000 ug | INTRAMUSCULAR | Status: DC | PRN
  Administered 2020-10-20 – 2020-10-22 (×4): 50 ug via INTRAVENOUS
  Filled 2020-10-20 (×4): qty 2

## 2020-10-20 MED ORDER — SCOPOLAMINE 1 MG/3DAYS TD PT72
1.0000 | MEDICATED_PATCH | TRANSDERMAL | Status: DC
  Administered 2020-10-21: 1.5 mg via TRANSDERMAL
  Filled 2020-10-20: qty 1

## 2020-10-20 MED ORDER — KETOROLAC TROMETHAMINE 30 MG/ML IJ SOLN
INTRAMUSCULAR | Status: AC
  Administered 2020-10-20: 30 mg via INTRAVENOUS
  Filled 2020-10-20: qty 1

## 2020-10-20 MED ORDER — SODIUM CHLORIDE 0.9 % IV SOLN
2.0000 g | INTRAVENOUS | Status: DC
  Administered 2020-10-20 – 2020-10-21 (×2): 2 g via INTRAVENOUS
  Filled 2020-10-20: qty 20
  Filled 2020-10-20: qty 2
  Filled 2020-10-20: qty 20

## 2020-10-20 MED ORDER — MUPIROCIN 2 % EX OINT
1.0000 "application " | TOPICAL_OINTMENT | Freq: Two times a day (BID) | CUTANEOUS | Status: DC
  Administered 2020-10-20: 1 via NASAL
  Filled 2020-10-20: qty 22

## 2020-10-20 MED ORDER — IOHEXOL 300 MG/ML  SOLN
100.0000 mL | Freq: Once | INTRAMUSCULAR | Status: AC | PRN
  Administered 2020-10-20: 100 mL via INTRAVENOUS

## 2020-10-20 MED ORDER — FENTANYL CITRATE (PF) 100 MCG/2ML IJ SOLN
50.0000 ug | Freq: Once | INTRAMUSCULAR | Status: AC
  Administered 2020-10-20: 50 ug via INTRAVENOUS
  Filled 2020-10-20: qty 2

## 2020-10-20 MED ORDER — SODIUM CHLORIDE 0.9 % IV SOLN: INTRAVENOUS | Status: AC

## 2020-10-20 MED ORDER — ONDANSETRON HCL 4 MG PO TABS: 4.0000 mg | ORAL_TABLET | Freq: Four times a day (QID) | ORAL | Status: DC | PRN

## 2020-10-20 MED ORDER — ACETAMINOPHEN 500 MG PO TABS
1000.0000 mg | ORAL_TABLET | ORAL | Status: AC
  Administered 2020-10-21: 1000 mg via ORAL
  Filled 2020-10-20: qty 2

## 2020-10-20 MED ORDER — LABETALOL HCL 5 MG/ML IV SOLN: 10.0000 mg | INTRAVENOUS | Status: DC | PRN

## 2020-10-20 NOTE — ED Notes (Signed)
Report given to floor - S[place], RN

## 2020-10-20 NOTE — Progress Notes (Signed)
HIDA scan shows nonvisualization of the gallbladder after 2 hours of imaging consistent with cystic duct obstruction.  We have recommended laparoscopic cholecystectomy with possible intraoperative cholangiogram for tomorrow.  Her brother is in the room with her.  They were both agreeable to the procedure.  We will schedule for surgery tomorrow.  Sips and chips tonight, n.p.o. after midnight.

## 2020-10-20 NOTE — Progress Notes (Signed)
Patient seen and examined personally, I reviewed the chart, history and physical and admission note, done by admitting physician this morning and agree with the same with following addendum.  Please refer to the morning admission note for more detailed plan of care.  Briefly,  49 year old female with history of hypertension on HCTZ presented with right upper and epigastric area abdominal pain nausea vomiting and subjective fever onset 10/18/2020. Patient was seen in the ED underwent extensive evaluation work-up unremarkable CMP and CBC right upper quadrant ultrasound was equivocal for acute calculus cholecystitis but concerning for possible hemobilia or portal venous gas, CT abdomen pelvis was obtained showed cholelithiasis with minimal fluid in the gallbladder fossa, could be related to early cholecystitis, no pneumobilia, no mass lesion anterior to the liver.  Patient admitted early this morning, C/o RUQ  epigastric pain 10/10 also + Worsens with deep breath, no vomitting today, did yesterday. Per her request her sister on the phone helping with some translation also patient is able to speak English   Right upper quadrant abdominal pain,suspecting acute cholecystitis/cholelithiasis: w/ equivocal studies on ultrasound and CT abdomen, awaiting for HIDA scan.  I added Toradol for pain as patient unable to take opiates as seh is waiting on HIDA. Continue IV ceftriaxone/Flagyl, IV fluids n.p.o.  Add PPI.  I have requested surgery consultation to further evaluate her. Discussed with patient's sister over the phone in extensive detail.

## 2020-10-20 NOTE — H&P (Signed)
History and Physical    [name] YYQ:[id number] DOB: 1971-10-27 DOA: 10/19/2020  PCP: Arnette Felts, FNP   Patient coming from: Home   Chief Complaint: Upper abdominal pain, N/V, subjective fevers   HPI: Margaret Singleton is a 49 y.o. female with medical history significant for hypertension, now presenting to the emergency department for evaluation of upper abdominal pain, nausea, vomiting, and subjective fevers.  Patient reports that she had been in her usual state of health until the morning of 10/18/2020 when she developed pain in the upper abdomen with nausea and nonbloody vomiting.  Patient reports that she woke early in the morning of October 18, 2020 with severe pain in the right upper quadrant.  Symptoms seem to ease off for a while before returning.  She has difficulty remembering if she had eaten shortly prior to the return of symptoms but has had fairly constant pain in the epigastrium and right upper quadrant for 24 hours or so, associated with nonbloody vomiting, and subjective fevers.  She did try to eat something despite the pain but reports immediately vomiting.  She denies any cough, shortness of breath, or chest pain.  Denies diarrhea, melena, or hematochezia.  ED Course: Upon arrival to the ED, patient is found to be afebrile, saturating well on room air, and with stable blood pressure.  EKG features sinus rhythm.  CMP and CBC are unremarkable.  Right upper quadrant ultrasound was equivocal for acute calculus cholecystitis but concerning for possible pneumobilia or portal venous gas.  CT abdomen pelvis was obtained and no pneumobilia or portal venous gas is seen but there is cholelithiasis with minimal fluid in the gallbladder fossa, possibly reflecting early cholecystitis.  Patient was treated with fentanyl and Zofran in the ED.  Review of Systems:  All other systems reviewed and apart from HPI, are negative.  Past Medical History:  Diagnosis Date  . Endometrial polyp    . History of diverticulitis of colon 02/2017  . History of ectopic pregnancy   . Hypertension   . PMB (postmenopausal bleeding)   . Thickened endometrium     Past Surgical History:  Procedure Laterality Date  . DILATATION & CURETTAGE/HYSTEROSCOPY WITH MYOSURE N/A 06/04/2019   Procedure: DILATATION & CURETTAGE/HYSTEROSCOPY WITH MYOSURE;  Surgeon: Romualdo Bolk, MD;  [facility];  Service: Gynecology;  Laterality: N/A;  follow previous case  . ECTOPIC PREGNANCY SURGERY  early 2000s  in [place]  . LAPAROSCOPIC APPENDECTOMY N/A 02/20/2014   Procedure: APPENDECTOMY LAPAROSCOPIC;  Surgeon: Liz Malady, MD;  Location: [facility] OR;  Service: General;  Laterality: N/A;    Social History:   reports that she has never smoked. She has never used smokeless tobacco. She reports that she does not drink alcohol and does not use drugs.  Allergies  Allergen Reactions  . Oxycodone Itching    Family History  Problem Relation Age of Onset  . Healthy Mother   . Healthy Father      Prior to Admission medications   Medication Sig Start Date End Date Taking? Authorizing Provider  hydrochlorothiazide (HYDRODIURIL) 12.5 MG tablet Take 1 tablet (12.5 mg total) by mouth daily. 03/26/20  Yes Arnette Felts, FNP    Physical Exam: Vitals:   10/20/20 0215 10/20/20 0215 10/20/20 0230 10/20/20 0300  BP: (!) 145/85  134/79 (!) 145/85  Pulse: 78  68 85  Resp: 20  (!) 21 15  Temp:  98.8 F (37.1 C)    TempSrc:  Oral    SpO2:  95%  98% 100%    Constitutional: NAD, calm  Eyes: PERTLA, lids and conjunctivae normal ENMT: Mucous membranes are moist. Posterior pharynx clear of any exudate or lesions.   Neck: normal, supple, no masses, no thyromegaly Respiratory:  no wheezing, no crackles. No accessory muscle use.  Cardiovascular: S1 & S2 heard, regular rate and rhythm. No extremity edema.   Abdomen: soft, tender in epigastrium and RUQ, no rebound pain or guarding. Bowel sounds  active.  Musculoskeletal: no clubbing / cyanosis. No joint deformity upper and lower extremities.   Skin: no significant rashes, lesions, ulcers. Warm, dry, well-perfused. Neurologic: CN 2-12 grossly intact. Sensation intact. Moving all extremities.  Psychiatric: Alert and oriented to person, place, and situation. Very pleasant and cooperative.    Labs and Imaging on Admission: I have personally reviewed following labs and imaging studies  CBC: Recent Labs  Lab 10/19/20 1938  WBC 6.5  HGB 13.7  HCT 43.5  MCV 89.1  PLT 205   Basic Metabolic Panel: Recent Labs  Lab 10/19/20 1938  NA 138  K 3.7  CL 105  CO2 26  GLUCOSE 114*  BUN 10  CREATININE 0.79  CALCIUM 9.1   GFR: CrCl cannot be calculated (Unknown ideal weight.). Liver Function Tests: Recent Labs  Lab 10/19/20 1938  AST 18  ALT 18  ALKPHOS 92  BILITOT 0.7  PROT 8.0  ALBUMIN 3.6   Recent Labs  Lab 10/19/20 1938  LIPASE 45   No results for input(s): AMMONIA in the last 168 hours. Coagulation Profile: No results for input(s): INR, PROTIME in the last 168 hours. Cardiac Enzymes: No results for input(s): CKTOTAL, CKMB, CKMBINDEX, TROPONINI in the last 168 hours. BNP (last 3 results) No results for input(s): PROBNP in the last 8760 hours. HbA1C: No results for input(s): HGBA1C in the last 72 hours. CBG: No results for input(s): GLUCAP in the last 168 hours. Lipid Profile: No results for input(s): CHOL, HDL, LDLCALC, TRIG, CHOLHDL, LDLDIRECT in the last 72 hours. Thyroid Function Tests: No results for input(s): TSH, T4TOTAL, FREET4, T3FREE, THYROIDAB in the last 72 hours. Anemia Panel: No results for input(s): VITAMINB12, FOLATE, FERRITIN, TIBC, IRON, RETICCTPCT in the last 72 hours. Urine analysis:    Component Value Date/Time   COLORURINE YELLOW 10/19/2020 2026   APPEARANCEUR CLOUDY (A) 10/19/2020 2026   LABSPEC 1.021 10/19/2020 2026   PHURINE 7.0 10/19/2020 2026   GLUCOSEU NEGATIVE 10/19/2020  2026   HGBUR NEGATIVE 10/19/2020 2026   BILIRUBINUR NEGATIVE 10/19/2020 2026   BILIRUBINUR negative 06/06/2019 1439   KETONESUR NEGATIVE 10/19/2020 2026   PROTEINUR NEGATIVE 10/19/2020 2026   UROBILINOGEN 1.0 06/06/2019 1439   UROBILINOGEN 0.2 03/31/2019 1039   NITRITE NEGATIVE 10/19/2020 2026   LEUKOCYTESUR NEGATIVE 10/19/2020 2026   Sepsis Labs: @LABRCNTIP (procalcitonin:4,lacticidven:4) )No results found for this or any previous visit (from the past 240 hour(s)).   Radiological Exams on Admission: Abdomen Limited  Result Date: 10/20/2020 CLINICAL DATA:  Right upper quadrant abdominal pain EXAM: ULTRASOUND ABDOMEN LIMITED RIGHT UPPER QUADRANT COMPARISON:  Ultrasound dated 07/05/2015 FINDINGS: Gallbladder: The gallbladder is distended. There is no gallbladder wall thickening. Gallstones and gallbladder sludge are noted. There is a trace amount of pericholecystic free fluid. The sonographic 09/04/2015 sign is equivocal. Common bile duct: Diameter: 6 mm Liver: There is no discrete hepatic mass. There are multiple subtle echogenic foci there appear to be arranged in a branching pattern within the liver. Portal vein is patent on color Doppler imaging with normal direction of  blood flow towards the liver. Other: Anterior to the left and right hepatic lobes within the peritoneal cavity, there is a heterogeneous somewhat ill-defined masslike area deep to the abdominal wall fascia. This area appears to contain both cystic and solid components. IMPRESSION: 1. Findings are equivocal for acute calculus cholecystitis. HIDA scan would be useful for further evaluation. 2. Echogenic foci in the liver is suspicious for pneumobilia or portal venous gas. Follow-up with contrast enhanced CT is recommended. 3. Heterogeneous masslike area anterior to the left hepatic lobe is favored to represent a somewhat atypical appearance for peritoneal fat. Attention on the follow-up CT is recommended. Electronically Signed   By:  Katherine Mantle M.D.   On: 10/20/2020 02:17    EKG: Independently reviewed. Sinus rhythm.   Assessment/Plan   1. Upper abdominal pain; ?acute cholecystitis  - Presents with pain in epigastrium and RUQ, N/V, and subjective fever, is found to be afebrile here with normal LFTs and no leukocytosis, [place] and CT euqivocal for acute cholecystitis  - Continue bowel-rest and pain-control, start empiric antibiotics, check HIDA scan    2. Hypertension  - BP was elevated initially, normalized with pain-control  - Hold HCTZ for now, continue pain-control, treat BP as needed    DVT prophylaxis: SCDs  Code Status: Full  Family Communication: Discussed with patient  Disposition Plan:  Patient is from: Home  Anticipated d/c is to: Home  Anticipated d/c date is: Possibly as early as 11/23 Patient currently: Pending HIDA scan, possible surgical consultation based on results  Consults called: None  Admission status: Observation    Briscoe Deutscher, MD [facility]  10/20/2020, 4:00 AM

## 2020-10-20 NOTE — Progress Notes (Signed)
Received patient from ED.  Patient AOx4, ambulatory, VSS with slightly elevated BP d/t abdominal pain at 10/10, oriented to room, bed controls, call light and plan of care.  Administered PRN pain medication Fentanyl 50 mcg per order. Patient now resting on bed comfortably with both eyes closed.  Will endorse to day shift RN appropriately.

## 2020-10-20 NOTE — Consult Note (Signed)
[facility] Consult Note  Margaret Singleton 25-May-1971  [id number].    Requesting MD: Bernette Redbird Chief Complaint: Abdominal pain nausea and vomiting starting on 10/18/2020 Reason for Consult: Possible cholecystitis/cholelithiasis HPI:  Patient is a 49 year old female who presented to the ED with complaint of abdominal pain that started earlier in the day on 10/19/2020, ~ 3 AM.  Associated with nausea and vomiting, no fever or chills.  Pain was in her upper abdomen.  No diarrhea.  She has a history of gallstones she had prior appendectomy, ectopic pregnancy, and hysterectomy.  Work-up shows she is afebrile but hypertensive admission blood pressure 186/100.  Heart rate remains in the 70's-80's range.  Sats are 97 to 100% on room air.  Labs yesterday shows a normal CMP except for glucose of 114, WBC 6.5, H/H 13.7/43.5, platelets 205,000.  Lipase was 45.  Repeat CMP this a.m. is again normal except for total protein of 8.3 and glucose of 118.  WBC 7.6, H/H 13.8/44.1, platelets 227,000.  Respiratory panel was negative.  Urinalysis is negative.  hCG < 5.0, abdominal ultrasound shows the gallbladder is distended there is no gallbladder wall thickening.  The gall stones and gallbladder sludge are noted.  There is a trace amount of pericholecystic free fluid.  This sonographic Eulah Pont sign is equivocal.  The common bile duct was 6 mm.  Anterior to the left and right hepatic lobes within the peritoneal cavity there is a heterogeneous somewhat ill-defined mass like area at deep in the abdominal fascial wall it appears to contain both cystic and solid components.  There is also a question of pneumobilia or portal venous gas follow-up with contrast CT was recommended.  CT scan shows no pneumobilia or mass lesion is present.  No focal lesions are present to explain the hypocholic areas identified on the ultrasound.  The common bile duct was normal there were layering gallstones noted.  Minimal fluid  present in the gallbladder fossa.  Impression was cholelithiasis with minimal fluid in the gallbladder fossa possibly related to early cholecystitis, no pneumobilia, no mass lesion anterior to the liver, no acute or focal abnormality to explain the patient's symptoms.  A HIDA scan has been ordered and we are asked to see.   ROS: ROS  Family History  Problem Relation Age of Onset  . Healthy Mother   . Healthy Father     Past Medical History:  Diagnosis Date  . Endometrial polyp   . History of diverticulitis of colon 02/2017  . History of ectopic pregnancy   . Hypertension   . PMB (postmenopausal bleeding)   . Thickened endometrium     Past Surgical History:  Procedure Laterality Date  . DILATATION & CURETTAGE/HYSTEROSCOPY WITH MYOSURE N/A 06/04/2019   Procedure: DILATATION & CURETTAGE/HYSTEROSCOPY WITH MYOSURE;  Surgeon: Romualdo Bolk, MD;  Location: [facility];  Service: Gynecology;  Laterality: N/A;  follow previous case  . ECTOPIC PREGNANCY SURGERY in [place]  early 2000s  in [place]  . LAPAROSCOPIC APPENDECTOMY  02/20/14 N/A 02/20/2014   Procedure: APPENDECTOMY LAPAROSCOPIC;  Surgeon: Liz Malady, MD;  Location: [facility] OR;  Service: General;  Laterality: N/A;    Social History:  reports that she has never smoked. She has never used smokeless tobacco. She reports that she does not drink alcohol and does not use drugs.  Patient lives alone She has 4 children none of whom are close.  Her husband is in [place].  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  EtOH: None Drugs: None Backup: None    Allergies:  Allergies  Allergen Reactions  . Oxycodone Itching    Medications Prior to Admission  Medication Sig Dispense Refill  . hydrochlorothiazide (HYDRODIURIL) 12.5 MG tablet Take 1 tablet (12.5 mg total) by mouth daily. 90 tablet 1    Blood pressure (!) 142/76, pulse 78, temperature 98.2 F (36.8 C), temperature source Oral, resp. rate 17, last menstrual period  03/30/2019, SpO2 100 %. Physical Exam:  General: pleasant, WD, African woman who is laying in bed; very tender Right side; RUQ more than the RLQ. HEENT: head is normocephalic, atraumatic.  Sclera are noninjected.  Pupils are equal  Ears and nose without any masses or lesions.  Mouth is pink and moist Heart: regular, rate, and rhythm.  Normal s1,s2. No obvious murmurs, gallops, or rubs noted.  Palpable radial and pedal pulses bilaterally Lungs: CTAB, no wheezes, rhonchi, or rales noted.  Respiratory effort nonlabored Abd: soft, overweight, Tender right side, RUQ more than RLQ., ND, few BS, no masses, hernias, or organomegaly.  Prior appy/C section MS: all 4 extremities are symmetrical with no cyanosis, clubbing, or edema. Skin: warm and dry with no masses, lesions, or rashes Neuro: Cranial nerves 2-12 grossly intact, sensation is normal throughout Psych: A&Ox3 with an appropriate affect.   Results for orders placed or performed during the hospital encounter of 10/19/20 (from the past 48 hour(s))  Lipase, blood     Status: None   Collection Time: 10/19/20  7:38 PM  Result Value Ref Range   Lipase 45 11 - 51 U/L    Comment: Performed at [facility], [address], [place], [place] [address]  Comprehensive metabolic panel     Status: Abnormal   Collection Time: 10/19/20  7:38 PM  Result Value Ref Range   Sodium 138 135 - 145 mmol/L   Potassium 3.7 3.5 - 5.1 mmol/L   Chloride 105 98 - 111 mmol/L   CO2 26 22 - 32 mmol/L   Glucose, Bld 114 (H) 70 - 99 mg/dL    Comment: Glucose reference range applies only to samples taken after fasting for at least 8 hours.   BUN 10 6 - 20 mg/dL   Creatinine, Ser 0.98 0.44 - 1.00 mg/dL   Calcium 9.1 8.9 - 11.9 mg/dL   Total Protein 8.0 6.5 - 8.1 g/dL   Albumin 3.6 3.5 - 5.0 g/dL   AST 18 15 - 41 U/L   ALT 18 0 - 44 U/L   Alkaline Phosphatase 92 38 - 126 U/L   Total Bilirubin 0.7 0.3 - 1.2 mg/dL   GFR, Estimated >14 >78 mL/min    Comment:  (NOTE) Calculated using the CKD-EPI Creatinine Equation (2021)    Anion gap 7 5 - 15    Comment: Performed at [facility], [address], [place], [place] [address]  CBC     Status: None   Collection Time: 10/19/20  7:38 PM  Result Value Ref Range   WBC 6.5 4.0 - 10.5 K/uL   RBC 4.88 3.87 - 5.11 MIL/uL   Hemoglobin 13.7 12.0 - 15.0 g/dL   HCT 13.0 36 - 46 %   MCV 89.1 80.0 - 100.0 fL   MCH 28.1 26.0 - 34.0 pg   MCHC 31.5 30.0 - 36.0 g/dL   RDW 86.5 78.4 - 69.6 %   Platelets 205 150 - 400 K/uL   nRBC 0.0 0.0 - 0.2 %    Comment: Performed  at [facility], [address], [place], [place] [address]  I-Stat beta hCG blood, ED     Status: None   Collection Time: 10/19/20  7:43 PM  Result Value Ref Range   I-stat hCG, quantitative <5.0 <5 mIU/mL   Comment 3            Comment:   GEST. AGE      CONC.  (mIU/mL)   <=1 WEEK        5 - 50     2 WEEKS       50 - 500     3 WEEKS       100 - 10,000     4 WEEKS     1,000 - 30,000        FEMALE AND NON-PREGNANT FEMALE:     LESS THAN 5 mIU/mL   Urinalysis, Routine w reflex microscopic Urine, Clean Catch     Status: Abnormal   Collection Time: 10/19/20  8:26 PM  Result Value Ref Range   Color, Urine YELLOW YELLOW   APPearance CLOUDY (A) CLEAR   Specific Gravity, Urine 1.021 1.005 - 1.030   pH 7.0 5.0 - 8.0   Glucose, UA NEGATIVE NEGATIVE mg/dL   Hgb urine dipstick NEGATIVE NEGATIVE   Bilirubin Urine NEGATIVE NEGATIVE   Ketones, ur NEGATIVE NEGATIVE mg/dL   Protein, ur NEGATIVE NEGATIVE mg/dL   Nitrite NEGATIVE NEGATIVE   Leukocytes,Ua NEGATIVE NEGATIVE    Comment: Performed at [facility], [address], [place], [place] [address]  Respiratory Panel by RT PCR (Flu A&B, Covid) - Nasopharyngeal Swab     Status: None   Collection Time: 10/20/20  3:02 AM   Specimen: Nasopharyngeal Swab; Nasopharyngeal(NP) swabs in vial transport medium  Result Value Ref Range   SARS Coronavirus 2 by RT PCR NEGATIVE NEGATIVE     Comment: (NOTE) SARS-CoV-2 target nucleic acids are NOT DETECTED.  The SARS-CoV-2 RNA is generally detectable in upper respiratoy specimens during the acute phase of infection. The lowest concentration of SARS-CoV-2 viral copies this assay can detect is 131 copies/mL. A negative result does not preclude SARS-Cov-2 infection and should not be used as the sole basis for treatment or other patient management decisions. A negative result may occur with  improper specimen collection/handling, submission of specimen other than nasopharyngeal swab, presence of viral mutation(s) within the areas targeted by this assay, and inadequate number of viral copies (<131 copies/mL). A negative result must be combined with clinical observations, patient history, and epidemiological information. The expected result is Negative.  Fact Sheet for Patients:  https://www.moore.com/  Fact Sheet for Healthcare Providers:  https://www.young.biz/  This test is no t yet approved or cleared by the [place] FDA and  has been authorized for detection and/or diagnosis of SARS-CoV-2 by FDA under an Emergency Use Authorization (EUA). This EUA will remain  in effect (meaning this test can be used) for the duration of the COVID-19 declaration under Section 564(b)(1) of the Act, 21 U.S.C. section 360bbb-3(b)(1), unless the authorization is terminated or revoked sooner.     Influenza A by PCR NEGATIVE NEGATIVE   Influenza B by PCR NEGATIVE NEGATIVE    Comment: (NOTE) The Xpert Xpress SARS-CoV-2/FLU/RSV assay is intended as an aid in  the diagnosis of influenza from Nasopharyngeal swab specimens and  should not be used as a sole basis for treatment. Nasal washings and  aspirates are unacceptable for Xpert Xpress SARS-CoV-2/FLU/RSV  testing.  Fact Sheet  for Patients: https://www.moore.com/  Fact Sheet for Healthcare  Providers: https://www.young.biz/  This test is not yet approved or cleared by the [place] FDA and  has been authorized for detection and/or diagnosis of SARS-CoV-2 by  FDA under an Emergency Use Authorization (EUA). This EUA will remain  in effect (meaning this test can be used) for the duration of the  Covid-19 declaration under Section 564(b)(1) of the Act, 21  U.S.C. section 360bbb-3(b)(1), unless the authorization is  terminated or revoked. Performed at [facility], [address] [place], [place] [address]   HIV Antibody (routine testing w rflx)     Status: None   Collection Time: 10/20/20  3:02 AM  Result Value Ref Range   HIV Screen 4th Generation wRfx Non Reactive Non Reactive    Comment: Performed at [facility], [address] [place], [place] [address]  Comprehensive metabolic panel     Status: Abnormal   Collection Time: 10/20/20  3:02 AM  Result Value Ref Range   Sodium 138 135 - 145 mmol/L   Potassium 4.2 3.5 - 5.1 mmol/L   Chloride 103 98 - 111 mmol/L   CO2 25 22 - 32 mmol/L   Glucose, Bld 118 (H) 70 - 99 mg/dL    Comment: Glucose reference range applies only to samples taken after fasting for at least 8 hours.   BUN 9 6 - 20 mg/dL   Creatinine, Ser 9.32 0.44 - 1.00 mg/dL   Calcium 9.3 8.9 - 35.5 mg/dL   Total Protein 8.3 (H) 6.5 - 8.1 g/dL   Albumin 3.5 3.5 - 5.0 g/dL   AST 21 15 - 41 U/L   ALT 20 0 - 44 U/L   Alkaline Phosphatase 86 38 - 126 U/L   Total Bilirubin 0.7 0.3 - 1.2 mg/dL   GFR, Estimated >73 >22 mL/min    Comment: (NOTE) Calculated using the CKD-EPI Creatinine Equation (2021)    Anion gap 10 5 - 15    Comment: Performed at [facility], [address] [place], [place] [address]  CBC     Status: None   Collection Time: 10/20/20  3:02 AM  Result Value Ref Range   WBC 7.6 4.0 - 10.5 K/uL   RBC 4.92 3.87 - 5.11 MIL/uL   Hemoglobin 13.8 12.0 - 15.0 g/dL   HCT 70.6 36 - 46 %   MCV 89.6 80.0 -  100.0 fL   MCH 28.0 26.0 - 34.0 pg   MCHC 31.3 30.0 - 36.0 g/dL   RDW 23.7 62.8 - 31.5 %   Platelets 227 150 - 400 K/uL   nRBC 0.0 0.0 - 0.2 %    Comment: Performed at [facility], [address] [place], [place] [address]   CT ABDOMEN PELVIS W CONTRAST  Result Date: 10/20/2020 CLINICAL DATA:  Abdominal pain, acute, nonlocalized. Epigastric pain and nausea beginning yesterday morning. EXAM: CT ABDOMEN AND PELVIS WITH CONTRAST TECHNIQUE: Multidetector CT imaging of the abdomen and pelvis was performed using the standard protocol following bolus administration of intravenous contrast. CONTRAST:  OMNIPAQUE IOHEXOL 300 MG/ML  SOLN COMPARISON:  Abdominal ultrasound 10/20/2020. CT of the abdomen pelvis with contrast 02/20/13 FINDINGS: Lower chest: The lung bases are clear without focal nodule, mass, or airspace disease. The heart size is normal. No significant pleural or pericardial effusion is present. Hepatobiliary: No pneumobilia or mass lesion is present. No focal lesions are present to explain the hyperechoic areas identified  on the ultrasound. Common bile duct is within normal limits. Layering gallstones are again noted. Minimal fluid is present in the gallbladder fossa. Pancreas: Unremarkable. No pancreatic ductal dilatation or surrounding inflammatory changes. Spleen: Normal in size without focal abnormality. Adrenals/Urinary Tract: Adrenal glands are normal bilaterally. Kidneys and ureters are within normal limits. The urinary bladder is normal. Stomach/Bowel: The stomach and duodenum are within normal limits. The area noted anterior to the liver may represent a portion of the stomach depending on the angle of insonation. No mass lesion is present. Small bowel is unremarkable. Terminal ileum is within normal limits. Appendix is surgically absent. The ascending and transverse colon are within normal limits. Descending and sigmoid colon are normal. Vascular/Lymphatic: No significant vascular  findings are present. No enlarged abdominal or pelvic lymph nodes. Reproductive: Uterus and bilateral adnexa are unremarkable. Other: No abdominal wall hernia or abnormality. No abdominopelvic ascites. Musculoskeletal: Vertebral body heights and alignment are normal. No focal lytic or blastic lesions are present. Pelvis is unremarkable. Hips are located and within normal limits. IMPRESSION: 1. Cholelithiasis with minimal fluid in the gallbladder fossa. This could be related to early cholecystitis. 2. No pneumobilia. 3. No mass lesion anterior to the liver. 4. No other acute or focal abnormality to explain the patient's symptoms. Electronically Signed   By: Christopher  Mattern M.D.   On: 10/20/2020 04:13   US Abdomen LimMarin Robertsited  Result Date: 10/20/2020 CLINICAL DATA:  Right upper quadrant abdominal pain EXAM: ULTRASOUND ABDOMEN LIMITED RIGHT UPPER QUADRANT COMPARISON:  Ultrasound dated 07/05/2015 FINDINGS: Gallbladder: The gallbladder is distended. There is no gallbladder wall thickening. Gallstones and gallbladder sludge are noted. There is a trace amount of pericholecystic free fluid. The sonographic Eulah PontMurphy sign is equivocal. Common bile duct: Diameter: 6 mm Liver: There is no discrete hepatic mass. There are multiple subtle echogenic foci there appear to be arranged in a branching pattern within the liver. Portal vein is patent on color Doppler imaging with normal direction of blood flow towards the liver. Other: Anterior to the left and right hepatic lobes within the peritoneal cavity, there is a heterogeneous somewhat ill-defined masslike area deep to the abdominal wall fascia. This area appears to contain both cystic and solid components. IMPRESSION: 1. Findings are equivocal for acute calculus cholecystitis. HIDA scan would be useful for further evaluation. 2. Echogenic foci in the liver is suspicious for pneumobilia or portal venous gas. Follow-up with contrast enhanced CT is recommended. 3. Heterogeneous  masslike area anterior to the left hepatic lobe is favored to represent a somewhat atypical appearance for peritoneal fat. Attention on the follow-up CT is recommended. Electronically Signed   By: Katherine Mantlehristopher  Green M.D.   On: 10/20/2020 02:17   . sodium chloride 100 mL/hr at 10/20/20 0622  . cefTRIAXone (ROCEPHIN)  IV 2 g (10/20/20 [id number])  . metronidazole 500 mg (10/20/20 [id number])       Assessment/Plan Hx hypertension History of diverticulitis Hx ectopic pregnancy  Abdominal pain, nausea and vomiting Gallstones/sludge/possible acute cholecystitis.  FEN:  IV fluids/NPO ID: Rocephin/Flagyl 11/22 >> day 1 DVT: SCDs added Follow-up: TBD  Plan:  Await HIDA, keep her NPO, continue antibiotics.  We will follow with you.  I think she needs a cholecystectomy also.  Discussed laparoscopic surgery with her, and she is agreeable to surgery; pending HIDA results.     Sherrie GeorgeJENNINGS,Jacklynn Dehaas, [facility] 10/20/2020, 10:59 AM Please see Amion for pager number during day hours 7:00am-4:30pm

## 2020-10-20 NOTE — ED Provider Notes (Signed)
[facility] EMERGENCY DEPARTMENT Provider Note   CSN: [id number] Arrival date & time: 10/19/20  1922     History Chief Complaint  Patient presents with  . Abdominal Pain    Margaret Singleton is a 49 y.o. female.  Patient presents to the ED with a chief complaint of abdominal pain.  She states that the pain began earlier today.  She reports associated nausea with vomiting.  She denies any fevers or chills.  She states that the pain is mostly located in her upper abdomen.  She denies any diarrhea. She states that she has had gallstones in the past, but believes she still has her gallbladder.  She has hx of appendectomy, ectopic, and hysterectomy.   The history is provided by the patient. No language interpreter was used.       Past Medical History:  Diagnosis Date  . Endometrial polyp   . History of diverticulitis of colon 02/2017  . History of ectopic pregnancy   . Hypertension   . PMB (postmenopausal bleeding)   . Thickened endometrium     Patient Active Problem List   Diagnosis Date Noted  . Vitamin D deficiency 06/22/2019  . Chronic nonintractable headache 03/12/2019  . Edema 03/12/2019  . Essential hypertension 02/12/2019  . Swelling of both ankles 02/12/2019  . Cholelithiasis 02/21/2014  . Acute appendicitis 02/20/2014    Past Surgical History:  Procedure Laterality Date  . DILATATION & CURETTAGE/HYSTEROSCOPY WITH MYOSURE N/A 06/04/2019   Procedure: DILATATION & CURETTAGE/HYSTEROSCOPY WITH MYOSURE;  Surgeon: Romualdo Bolk, MD;  Location: [facility];  Service: Gynecology;  Laterality: N/A;  follow previous case  . ECTOPIC PREGNANCY SURGERY  early 2000s  in [place]  . LAPAROSCOPIC APPENDECTOMY N/A 02/20/2014   Procedure: APPENDECTOMY LAPAROSCOPIC;  Surgeon: Liz Malady, MD;  Location: [facility] OR;  Service: General;  Laterality: N/A;     OB History    Gravida  5   Para  4   Term  0   Preterm  4   AB  1   Living  4       SAB  0   TAB  0   Ectopic  1   Multiple      Live Births  4           Family History  Problem Relation Age of Onset  . Healthy Mother   . Healthy Father     Social History   Tobacco Use  . Smoking status: Never Smoker  . Smokeless tobacco: Never Used  Vaping Use  . Vaping Use: Never used  Substance Use Topics  . Alcohol use: No  . Drug use: Never    Home Medications Prior to Admission medications   Medication Sig Start Date End Date Taking? Authorizing Provider  hydrochlorothiazide (HYDRODIURIL) 12.5 MG tablet Take 1 tablet (12.5 mg total) by mouth daily. 03/26/20   Arnette Felts, FNP  ibuprofen (ADVIL) 400 MG tablet Take 1 tablet (400 mg total) by mouth every 6 (six) hours as needed. Patient not taking: Reported on 06/06/2019 03/31/19   Linus Mako B, NP  Magnesium 200 MG TABS Take 1 tablet by mouth daily with evening meal. 03/26/20   Arnette Felts, FNP  mefloquine (LARIAM) 250 MG tablet Take 1 tablet a week for 2 weeks prior to travel then take 1 tablet by mouth for one week while at destination then take 1 tablet by mouth weekly for 4 weeks upon returning 04/08/20   Christell Constant,  Lolita CramJanece, FNP  Vitamin D, Ergocalciferol, (DRISDOL) 1.25 MG (50000 UT) CAPS capsule Take 1 capsule (50,000 Units total) by mouth 2 (two) times a week. Patient not taking: Reported on 03/26/2020 06/07/19   Arnette FeltsMoore, Janece, FNP    Allergies    Oxycodone  Review of Systems   Review of Systems  All other systems reviewed and are negative.   Physical Exam Updated Vital Signs BP (!) 165/100 (BP Location: Right Arm)   Pulse 76   Temp 98.4 F (36.9 C) (Oral)   Resp 18   LMP 03/30/2019   SpO2 100%   Physical Exam Vitals and nursing note reviewed.  Constitutional:      General: She is not in acute distress.    Appearance: She is well-developed.  HENT:     Head: Normocephalic and atraumatic.  Eyes:     Conjunctiva/sclera: Conjunctivae normal.  Cardiovascular:     Rate and Rhythm: Normal  rate and regular rhythm.     Heart sounds: No murmur heard.   Pulmonary:     Effort: Pulmonary effort is normal. No respiratory distress.     Breath sounds: Normal breath sounds.  Abdominal:     Palpations: Abdomen is soft.     Tenderness: There is abdominal tenderness.     Comments: epigastric  Musculoskeletal:        General: Normal range of motion.     Cervical back: Neck supple.  Skin:    General: Skin is warm and dry.  Neurological:     Mental Status: She is alert and oriented to person, place, and time.  Psychiatric:        Mood and Affect: Mood normal.        Behavior: Behavior normal.     ED Results / Procedures / Treatments   Labs (all labs ordered are listed, but only abnormal results are displayed) Labs Reviewed  COMPREHENSIVE METABOLIC PANEL - Abnormal; Notable for the following components:      Result Value   Glucose, Bld 114 (*)    All other components within normal limits  URINALYSIS, ROUTINE W REFLEX MICROSCOPIC - Abnormal; Notable for the following components:   APPearance CLOUDY (*)    All other components within normal limits  LIPASE, BLOOD  CBC  I-STAT BETA HCG BLOOD, ED ([facility], [facility], [facility] ONLY)    EKG None  Radiology CT ABDOMEN PELVIS W CONTRAST  Result Date: 10/20/2020 CLINICAL DATA:  Abdominal pain, acute, nonlocalized. Epigastric pain and nausea beginning yesterday morning. EXAM: CT ABDOMEN AND PELVIS WITH CONTRAST TECHNIQUE: Multidetector CT imaging of the abdomen and pelvis was performed using the standard protocol following bolus administration of intravenous contrast. CONTRAST:  100mL OMNIPAQUE IOHEXOL 300 MG/ML  SOLN COMPARISON:  Abdominal ultrasound 10/20/2020. CT of the abdomen pelvis with contrast 02/20/13 FINDINGS: Lower chest: The lung bases are clear without focal nodule, mass, or airspace disease. The heart size is normal. No significant pleural or pericardial effusion is present. Hepatobiliary: No pneumobilia or mass lesion is present. No  focal lesions are present to explain the hyperechoic areas identified on the ultrasound. Common bile duct is within normal limits. Layering gallstones are again noted. Minimal fluid is present in the gallbladder fossa. Pancreas: Unremarkable. No pancreatic ductal dilatation or surrounding inflammatory changes. Spleen: Normal in size without focal abnormality. Adrenals/Urinary Tract: Adrenal glands are normal bilaterally. Kidneys and ureters are within normal limits. The urinary bladder is normal. Stomach/Bowel: The stomach and duodenum are within normal limits. The area noted anterior to  the liver may represent a portion of the stomach depending on the angle of insonation. No mass lesion is present. Small bowel is unremarkable. Terminal ileum is within normal limits. Appendix is surgically absent. The ascending and transverse colon are within normal limits. Descending and sigmoid colon are normal. Vascular/Lymphatic: No significant vascular findings are present. No enlarged abdominal or pelvic lymph nodes. Reproductive: Uterus and bilateral adnexa are unremarkable. Other: No abdominal wall hernia or abnormality. No abdominopelvic ascites. Musculoskeletal: Vertebral body heights and alignment are normal. No focal lytic or blastic lesions are present. Pelvis is unremarkable. Hips are located and within normal limits. IMPRESSION: 1. Cholelithiasis with minimal fluid in the gallbladder fossa. This could be related to early cholecystitis. 2. No pneumobilia. 3. No mass lesion anterior to the liver. 4. No other acute or focal abnormality to explain the patient's symptoms. Electronically Signed   By: Marin Roberts M.D.   On: 10/20/2020 04:13   US Abdomen Limited  Result Date: 10/20/2020 CLINICAL DATA:  Right upper quadrant abdominal pain EXAM: ULTRASOUND ABDOMEN LIMITED RIGHT UPPER QUADRANT COMPARISON:  Ultrasound dated 07/05/2015 FINDINGS: Gallbladder: The gallbladder is distended. There is no gallbladder wall  thickening. Gallstones and gallbladder sludge are noted. There is a trace amount of pericholecystic free fluid. The sonographic Eulah Pont sign is equivocal. Common bile duct: Diameter: 6 mm Liver: There is no discrete hepatic mass. There are multiple subtle echogenic foci there appear to be arranged in a branching pattern within the liver. Portal vein is patent on color Doppler imaging with normal direction of blood flow towards the liver. Other: Anterior to the left and right hepatic lobes within the peritoneal cavity, there is a heterogeneous somewhat ill-defined masslike area deep to the abdominal wall fascia. This area appears to contain both cystic and solid components. IMPRESSION: 1. Findings are equivocal for acute calculus cholecystitis. HIDA scan would be useful for further evaluation. 2. Echogenic foci in the liver is suspicious for pneumobilia or portal venous gas. Follow-up with contrast enhanced CT is recommended. 3. Heterogeneous masslike area anterior to the left hepatic lobe is favored to represent a somewhat atypical appearance for peritoneal fat. Attention on the follow-up CT is recommended. Electronically Signed   By: Katherine Mantle M.D.   On: 10/20/2020 02:17    Procedures Procedures (including critical care time)  Medications Ordered in ED Medications - No data to display  ED Course  I have reviewed the triage vital signs and the nursing notes.  Pertinent labs & imaging results that were available during my care of the patient were reviewed by me and considered in my medical decision making (see chart for details).    MDM Rules/Calculators/A&P                          This patient complains of abdominal pain, this involves an extensive number of treatment options, and is a complaint that carries with it a high risk of complications and morbidity.    Differential Dx Cholecystitis, kidney stone, pneumonia, musculoskeletal pain, colitis  Pertinent Labs I ordered, reviewed,  and interpreted labs, which included CBC, CMP, lipase, which are reassuring.  Imaging Interpretation I ordered imaging studies which included ultrasound of right upper quadrant shows equivocal findings for cholecystitis.  Ultrasound also shows some questionable findings related to pneumobilia.  Will check CT.  HIDA scan recommended.    CT shows no evidence of pneumobilia, or other any focal findings.  Medications I ordered medication fentanyl for  pain.  Sources Previous records obtained and reviewed prior history of appendicitis, also has had gallstones in the past.  Still has gallbladder.  Reassessments After the interventions stated above, I reevaluated the patient and found with improved pain.  Consultants Dr. Antionette Char, hospitalist, who will admit.  Plan Admit to medicine.    Final Clinical Impression(s) / ED Diagnoses Final diagnoses:  Right upper quadrant abdominal pain    Rx / DC Orders ED Discharge Orders    None       Roxy Horseman, PA-C 10/20/20 9924    Mesner, Barbara Cower, MD 10/20/20 0700

## 2020-10-21 ENCOUNTER — Encounter (HOSPITAL_COMMUNITY): Payer: Self-pay | Admitting: Family Medicine

## 2020-10-21 ENCOUNTER — Encounter (HOSPITAL_COMMUNITY): Admission: EM | Disposition: A | Discharge status: Home / Self Care | Payer: Self-pay | Source: Home / Self Care

## 2020-10-21 ENCOUNTER — Observation Stay (HOSPITAL_COMMUNITY): Payer: BC Managed Care – PPO | Admitting: Certified Registered"

## 2020-10-21 DIAGNOSIS — Z20822 Contact with and (suspected) exposure to covid-19: Secondary | ICD-10-CM | POA: Diagnosis present

## 2020-10-21 DIAGNOSIS — Z90710 Acquired absence of both cervix and uterus: Secondary | ICD-10-CM | POA: Diagnosis not present

## 2020-10-21 DIAGNOSIS — I10 Essential (primary) hypertension: Secondary | ICD-10-CM | POA: Diagnosis present

## 2020-10-21 DIAGNOSIS — R1011 Right upper quadrant pain: Secondary | ICD-10-CM | POA: Diagnosis present

## 2020-10-21 DIAGNOSIS — T7849XA Other allergy, initial encounter: Secondary | ICD-10-CM | POA: Diagnosis not present

## 2020-10-21 DIAGNOSIS — K8012 Calculus of gallbladder with acute and chronic cholecystitis without obstruction: Secondary | ICD-10-CM | POA: Diagnosis present

## 2020-10-21 DIAGNOSIS — X58XXXA Exposure to other specified factors, initial encounter: Secondary | ICD-10-CM | POA: Diagnosis not present

## 2020-10-21 DIAGNOSIS — Z79899 Other long term (current) drug therapy: Secondary | ICD-10-CM | POA: Diagnosis not present

## 2020-10-21 DIAGNOSIS — K821 Hydrops of gallbladder: Secondary | ICD-10-CM | POA: Diagnosis present

## 2020-10-21 HISTORY — PX: LYSIS OF ADHESION: SHX5961

## 2020-10-21 HISTORY — PX: CHOLECYSTECTOMY: SHX55

## 2020-10-21 LAB — CBC
HCT: 38.8 % (ref 36.0–46.0)
Hemoglobin: 12.5 g/dL (ref 12.0–15.0)
MCH: 28.5 pg (ref 26.0–34.0)
MCHC: 32.2 g/dL (ref 30.0–36.0)
MCV: 88.4 fL (ref 80.0–100.0)
Platelets: 194 10*3/uL (ref 150–400)
RBC: 4.39 MIL/uL (ref 3.87–5.11)
RDW: 14.2 % (ref 11.5–15.5)
WBC: 7.6 10*3/uL (ref 4.0–10.5)
nRBC: 0 % (ref 0.0–0.2)

## 2020-10-21 LAB — COMPREHENSIVE METABOLIC PANEL
ALT: 15 U/L (ref 0–44)
AST: 14 U/L — ABNORMAL LOW (ref 15–41)
Albumin: 2.9 g/dL — ABNORMAL LOW (ref 3.5–5.0)
Alkaline Phosphatase: 75 U/L (ref 38–126)
Anion gap: 9 (ref 5–15)
BUN: 9 mg/dL (ref 6–20)
CO2: 24 mmol/L (ref 22–32)
Calcium: 8.4 mg/dL — ABNORMAL LOW (ref 8.9–10.3)
Chloride: 105 mmol/L (ref 98–111)
Creatinine, Ser: 0.7 mg/dL (ref 0.44–1.00)
GFR, Estimated: >60 mL/min (ref >60)
Glucose, Bld: 109 mg/dL — ABNORMAL HIGH (ref 70–99)
Potassium: 3.6 mmol/L (ref 3.5–5.1)
Sodium: 138 mmol/L (ref 135–145)
Total Bilirubin: 1.4 mg/dL — ABNORMAL HIGH (ref 0.3–1.2)
Total Protein: 6.9 g/dL (ref 6.5–8.1)

## 2020-10-21 SURGERY — LAPAROSCOPIC CHOLECYSTECTOMY WITH INTRAOPERATIVE CHOLANGIOGRAM
Anesthesia: General | Site: Abdomen

## 2020-10-21 MED ORDER — PROPOFOL 10 MG/ML IV BOLUS
INTRAVENOUS | Status: AC
  Filled 2020-10-21: qty 20

## 2020-10-21 MED ORDER — FENTANYL CITRATE (PF) 250 MCG/5ML IJ SOLN
INTRAMUSCULAR | Status: AC
  Filled 2020-10-21: qty 5

## 2020-10-21 MED ORDER — SODIUM CHLORIDE 0.9 % IR SOLN
Status: DC | PRN
  Administered 2020-10-21: 1000 mL

## 2020-10-21 MED ORDER — DIPHENHYDRAMINE HCL 50 MG/ML IJ SOLN
INTRAMUSCULAR | Status: AC
  Administered 2020-10-21: 25 mg
  Filled 2020-10-21: qty 1

## 2020-10-21 MED ORDER — KETOROLAC TROMETHAMINE 30 MG/ML IJ SOLN: 15.0000 mg | Freq: Three times a day (TID) | INTRAMUSCULAR | Status: DC | PRN

## 2020-10-21 MED ORDER — ACETAMINOPHEN 325 MG PO TABS
650.0000 mg | ORAL_TABLET | Freq: Four times a day (QID) | ORAL | Status: DC
  Administered 2020-10-21 – 2020-10-23 (×8): 650 mg via ORAL
  Filled 2020-10-21 (×8): qty 2

## 2020-10-21 MED ORDER — ROCURONIUM BROMIDE 10 MG/ML (PF) SYRINGE
PREFILLED_SYRINGE | INTRAVENOUS | Status: DC | PRN
  Administered 2020-10-21: 30 mg via INTRAVENOUS
  Administered 2020-10-21: 70 mg via INTRAVENOUS

## 2020-10-21 MED ORDER — ONDANSETRON HCL 4 MG/2ML IJ SOLN
INTRAMUSCULAR | Status: AC
  Filled 2020-10-21: qty 2

## 2020-10-21 MED ORDER — SUGAMMADEX SODIUM 200 MG/2ML IV SOLN
INTRAVENOUS | Status: DC | PRN
  Administered 2020-10-21: 300 mg via INTRAVENOUS

## 2020-10-21 MED ORDER — ONDANSETRON HCL 4 MG/2ML IJ SOLN: 4.0000 mg | Freq: Once | INTRAMUSCULAR | Status: DC | PRN

## 2020-10-21 MED ORDER — MIDAZOLAM HCL 2 MG/2ML IJ SOLN
INTRAMUSCULAR | Status: DC | PRN
  Administered 2020-10-21: 2 mg via INTRAVENOUS

## 2020-10-21 MED ORDER — LACTATED RINGERS IV SOLN: INTRAVENOUS | Status: DC | PRN

## 2020-10-21 MED ORDER — IBUPROFEN 600 MG PO TABS
600.0000 mg | ORAL_TABLET | Freq: Four times a day (QID) | ORAL | Status: DC | PRN
  Administered 2020-10-22: 600 mg via ORAL
  Filled 2020-10-21: qty 1

## 2020-10-21 MED ORDER — BUPIVACAINE HCL (PF) 0.25 % IJ SOLN
INTRAMUSCULAR | Status: DC | PRN
  Administered 2020-10-21: 17 mL

## 2020-10-21 MED ORDER — ONDANSETRON HCL 4 MG/2ML IJ SOLN
INTRAMUSCULAR | Status: DC | PRN
  Administered 2020-10-21: 4 mg via INTRAVENOUS

## 2020-10-21 MED ORDER — MIDAZOLAM HCL 2 MG/2ML IJ SOLN
INTRAMUSCULAR | Status: AC
  Filled 2020-10-21: qty 2

## 2020-10-21 MED ORDER — 0.9 % SODIUM CHLORIDE (POUR BTL) OPTIME
TOPICAL | Status: DC | PRN
  Administered 2020-10-21: 1000 mL

## 2020-10-21 MED ORDER — BUPIVACAINE HCL (PF) 0.25 % IJ SOLN
INTRAMUSCULAR | Status: AC
  Filled 2020-10-21: qty 30

## 2020-10-21 MED ORDER — FENTANYL CITRATE (PF) 250 MCG/5ML IJ SOLN
INTRAMUSCULAR | Status: DC | PRN
  Administered 2020-10-21: 150 ug via INTRAVENOUS
  Administered 2020-10-21: 50 ug via INTRAVENOUS

## 2020-10-21 MED ORDER — PHENYLEPHRINE 40 MCG/ML (10ML) SYRINGE FOR IV PUSH (FOR BLOOD PRESSURE SUPPORT)
PREFILLED_SYRINGE | INTRAVENOUS | Status: DC | PRN
  Administered 2020-10-21 (×2): 80 ug via INTRAVENOUS
  Administered 2020-10-21: 160 ug via INTRAVENOUS

## 2020-10-21 MED ORDER — LIDOCAINE 2% (20 MG/ML) 5 ML SYRINGE
INTRAMUSCULAR | Status: DC | PRN
  Administered 2020-10-21: 100 mg via INTRAVENOUS

## 2020-10-21 MED ORDER — KETOROLAC TROMETHAMINE 30 MG/ML IJ SOLN: 15.0000 mg | Freq: Three times a day (TID) | INTRAMUSCULAR | Status: AC | PRN

## 2020-10-21 MED ORDER — FAMOTIDINE IN NACL 20-0.9 MG/50ML-% IV SOLN
20.0000 mg | Freq: Once | INTRAVENOUS | Status: AC
  Administered 2020-10-21: 20 mg via INTRAVENOUS
  Filled 2020-10-21: qty 50

## 2020-10-21 MED ORDER — LIDOCAINE HCL (PF) 2 % IJ SOLN
INTRAMUSCULAR | Status: AC
  Filled 2020-10-21: qty 5

## 2020-10-21 MED ORDER — DIPHENHYDRAMINE HCL 25 MG PO CAPS
25.0000 mg | ORAL_CAPSULE | Freq: Four times a day (QID) | ORAL | Status: DC | PRN
  Administered 2020-10-22: 25 mg via ORAL
  Filled 2020-10-21: qty 1

## 2020-10-21 MED ORDER — IBUPROFEN 600 MG PO TABS: 600.0000 mg | ORAL_TABLET | Freq: Four times a day (QID) | ORAL | Status: DC | PRN

## 2020-10-21 MED ORDER — DEXAMETHASONE SODIUM PHOSPHATE 10 MG/ML IJ SOLN
INTRAMUSCULAR | Status: DC | PRN
  Administered 2020-10-21: 10 mg via INTRAVENOUS

## 2020-10-21 MED ORDER — TRAMADOL HCL 50 MG PO TABS
50.0000 mg | ORAL_TABLET | Freq: Four times a day (QID) | ORAL | Status: DC | PRN
  Administered 2020-10-21: 50 mg via ORAL
  Administered 2020-10-22 – 2020-10-23 (×3): 100 mg via ORAL
  Filled 2020-10-21: qty 2
  Filled 2020-10-21: qty 1
  Filled 2020-10-21 (×2): qty 2

## 2020-10-21 MED ORDER — MEPERIDINE HCL 25 MG/ML IJ SOLN: 6.2500 mg | INTRAMUSCULAR | Status: DC | PRN

## 2020-10-21 MED ORDER — KETOROLAC TROMETHAMINE 30 MG/ML IJ SOLN: 30.0000 mg | Freq: Three times a day (TID) | INTRAMUSCULAR | Status: DC | PRN

## 2020-10-21 MED ORDER — HYDROMORPHONE HCL 1 MG/ML IJ SOLN: 0.2500 mg | INTRAMUSCULAR | Status: DC | PRN

## 2020-10-21 MED ORDER — DEXAMETHASONE SODIUM PHOSPHATE 10 MG/ML IJ SOLN
INTRAMUSCULAR | Status: AC
  Filled 2020-10-21: qty 1

## 2020-10-21 MED ORDER — PROPOFOL 10 MG/ML IV BOLUS
INTRAVENOUS | Status: DC | PRN
  Administered 2020-10-21: 120 mg via INTRAVENOUS

## 2020-10-21 MED ORDER — SCOPOLAMINE 1 MG/3DAYS TD PT72
MEDICATED_PATCH | TRANSDERMAL | Status: AC
  Filled 2020-10-21: qty 1

## 2020-10-21 MED ORDER — ROCURONIUM BROMIDE 10 MG/ML (PF) SYRINGE
PREFILLED_SYRINGE | INTRAVENOUS | Status: AC
  Filled 2020-10-21: qty 10

## 2020-10-21 SURGICAL SUPPLY — 49 items
APPLIER CLIP 5 13 M/L LIGAMAX5 (MISCELLANEOUS) ×3
BLADE CLIPPER SURG (BLADE) IMPLANT
CANISTER SUCT 3000ML PPV (MISCELLANEOUS) ×3 IMPLANT
CHLORAPREP W/TINT 26 (MISCELLANEOUS) ×3 IMPLANT
CLIP APPLIE 5 13 M/L LIGAMAX5 (MISCELLANEOUS) ×1 IMPLANT
COVER MAYO STAND STRL (DRAPES) ×3 IMPLANT
COVER SURGICAL LIGHT HANDLE (MISCELLANEOUS) ×3 IMPLANT
COVER WAND RF STERILE (DRAPES) ×3 IMPLANT
CUTTER FLEX LINEAR 45M (STAPLE) ×3 IMPLANT
DERMABOND ADVANCED (GAUZE/BANDAGES/DRESSINGS) ×2
DERMABOND ADVANCED .7 DNX12 (GAUZE/BANDAGES/DRESSINGS) ×1 IMPLANT
DISSECTOR BLUNT TIP ENDO 5MM (MISCELLANEOUS) IMPLANT
DRAPE C-ARM 42X120 X-RAY (DRAPES) IMPLANT
ELECT CAUTERY BLADE 6.4 (BLADE) IMPLANT
ELECT REM PT RETURN 9FT ADLT (ELECTROSURGICAL) ×3
ELECTRODE REM PT RTRN 9FT ADLT (ELECTROSURGICAL) ×1 IMPLANT
GLOVE BIO SURGEON STRL SZ7.5 (GLOVE) ×3 IMPLANT
GLOVE INDICATOR 8.0 STRL GRN (GLOVE) ×3 IMPLANT
GOWN STRL REUS W/ TWL LRG LVL3 (GOWN DISPOSABLE) ×2 IMPLANT
GOWN STRL REUS W/ TWL XL LVL3 (GOWN DISPOSABLE) ×1 IMPLANT
GOWN STRL REUS W/TWL LRG LVL3 (GOWN DISPOSABLE) ×6
GOWN STRL REUS W/TWL XL LVL3 (GOWN DISPOSABLE) ×3
GRASPER SUT TROCAR 14GX15 (MISCELLANEOUS) ×3 IMPLANT
KIT BASIN OR (CUSTOM PROCEDURE TRAY) ×3 IMPLANT
KIT TURNOVER KIT B (KITS) ×3 IMPLANT
L-HOOK LAP DISP 36CM (ELECTROSURGICAL) ×3
LHOOK LAP DISP 36CM (ELECTROSURGICAL) ×1 IMPLANT
NEEDLE INSUFFLATION 14GA 120MM (NEEDLE) ×3 IMPLANT
NS IRRIG 1000ML POUR BTL (IV SOLUTION) ×3 IMPLANT
PAD ARMBOARD 7.5X6 YLW CONV (MISCELLANEOUS) ×3 IMPLANT
PENCIL SMOKE EVACUATOR (MISCELLANEOUS) ×3 IMPLANT
PORT ACCESS TROCAR AIRSEAL 5 (TROCAR) ×3 IMPLANT
POUCH SPECIMEN RETRIEVAL 10MM (ENDOMECHANICALS) ×3 IMPLANT
RELOAD STAPLE TA45 3.5 REG BLU (ENDOMECHANICALS) ×3 IMPLANT
SCISSORS LAP 5X35 DISP (ENDOMECHANICALS) ×3 IMPLANT
SET CHOLANGIOGRAPH 5 50 .035 (SET/KITS/TRAYS/PACK) ×3 IMPLANT
SET IRRIG TUBING LAPAROSCOPIC (IRRIGATION / IRRIGATOR) ×3 IMPLANT
SET TUBE SMOKE EVAC HIGH FLOW (TUBING) ×3 IMPLANT
SLEEVE ENDOPATH XCEL 5M (ENDOMECHANICALS) ×6 IMPLANT
SPECIMEN JAR SMALL (MISCELLANEOUS) ×3 IMPLANT
SUT MNCRL AB 4-0 PS2 18 (SUTURE) ×3 IMPLANT
TOWEL GREEN STERILE (TOWEL DISPOSABLE) ×3 IMPLANT
TOWEL GREEN STERILE FF (TOWEL DISPOSABLE) ×3 IMPLANT
TRAY LAPAROSCOPIC MC (CUSTOM PROCEDURE TRAY) ×3 IMPLANT
TROCAR PORT AIRSEAL 5X120 (TROCAR) ×3 IMPLANT
TROCAR XCEL 12X100 BLDLESS (ENDOMECHANICALS) ×3 IMPLANT
TROCAR XCEL BLUNT TIP 100MML (ENDOMECHANICALS) IMPLANT
TROCAR XCEL NON-BLD 5MMX100MML (ENDOMECHANICALS) ×3 IMPLANT
WATER STERILE IRR 1000ML POUR (IV SOLUTION) ×3 IMPLANT

## 2020-10-21 NOTE — Progress Notes (Addendum)
Pt developed swelling of the left > right eye around 2:30 PM.  I was called and came to the room.  She has a little rash on her chest, and perhaps some on her leg.  She has no respiratory compromise.  I gave her Benadryl 50 mg IV.  We have not give steroids at this point.  VS:  185/110, HR 103, 96% O2 sat on room air, Temp 98.4.  We will follow closely.    1530 hrs:  Swelling better, no respiratory issues, BP 142/83; T 98.9; HR 93; Sat or RA 93%.  Will also add H2 blocker IV x 1  1605 hrs.  Swelling right eye pretty much resolved, left eye is much better, no respiratory issues.

## 2020-10-21 NOTE — Discharge Summary (Signed)
Physician Discharge Summary  Patient ID: Margaret Singleton MRN: [id number] DOB/AGE: Oct 25, 1971 49 y.o.  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 Mills Koller Course:  Margaret Singleton is a 49 y.o. female with medical history significant for hypertension, now presenting to the emergency department for evaluation of upper abdominal pain, nausea, vomiting, and subjective fevers.  Patient reports that she had been in her usual state of health until the morning of 10/18/2020 when she developed pain in the upper abdomen with nausea and nonbloody vomiting.  Patient reports that she woke early in the morning of October 18, 2020 with severe pain in the right upper quadrant.  Symptoms seem to ease off for a while before returning.  She has difficulty remembering if she had eaten shortly prior to the return of symptoms but has had fairly constant pain in the epigastrium and right upper quadrant for 24 hours or so, associated with nonbloody vomiting, and subjective fevers.  She did try to eat something despite the pain but reports immediately vomiting.  She denies any cough, shortness of breath, or chest pain.  Denies diarrhea, melena, or hematochezia.  ED Course: Upon arrival to the ED, patient is found to be afebrile, saturating well on room air, and with stable blood pressure.  EKG features sinus rhythm.  CMP and CBC are unremarkable.  Right upper quadrant ultrasound was equivocal for acute calculus cholecystitis but concerning for possible pneumobilia or portal venous gas.  CT abdomen pelvis was obtained and no pneumobilia or  portal venous gas is seen but there is cholelithiasis with minimal fluid in the gallbladder fossa, possibly reflecting early cholecystitis.  Patient was treated with fentanyl and Zofran in the ED. she was admitted by Medicine.  She was very symptomatic on 10/20/2020.  A HIDA scan was obtained and showed nonvisualization.  She was maintained on IV antibiotics and taken the operating room the following day on 10/21/2020.  She underwent laparoscopic cholecystectomy.  They were unable to do a intraoperative cholangiogram due to the friability of the cystic duct.  She was observed overnight and follow-up LFTs were trending down on day of discharge.   Physical Exam: General appearance: alert, cooperative, no distress  ENT: some facial swelling especially under her eyes the left eye more so than the right, improving  Resp: clear to auscultation bilaterally Cardio: Regular rate and rhythm GI: Large abdomen, appropriately ttp, incisions c/d/i, +BS Skin: Skin color, texture, turgor normal. No rashes    Disposition: Discharge disposition: 01-Home or Self Care        Allergies as of 10/23/2020      Reactions   Oxycodone Itching      Medication List    TAKE these medications   acetaminophen 325 MG tablet Commonly known as: TYLENOL Take 2 tablets (650 mg total) by mouth every 6 (six) hours as needed for mild pain or fever.   diphenhydrAMINE 25 mg capsule Commonly known as: BENADRYL Take 1-2 capsules (25-50 mg total) by mouth every 6 (six) hours as needed for itching.   hydrochlorothiazide 12.5 MG tablet Commonly known as: HYDRODIURIL Take 1 tablet (12.5 mg total) by mouth daily.   ibuprofen 200 MG tablet Commonly known as: ADVIL Take 3 tablets (600  mg total) by mouth every 6 (six) hours as needed for mild pain.   traMADol 50 MG tablet Commonly known as: ULTRAM Take 1-2 tablets (50-100 mg total) by mouth every 6 (six) hours as needed for moderate pain or severe pain.       Follow-up  Information    Surgery, [facility]. Go on 11/11/2020.   Specialty: General Surgery Why: Follow up appointment scheduled for 12:15 PM. Please arrive 30 min prior to appointment time. Bring photo ID and any insurance information.  Contact information: [address] [place] [address] [phone]               Signed: Juliet Rude, [facility] 10/23/2020, 11:07 AM Please see Amion for pager number during day hours 7:00am-4:30pm

## 2020-10-21 NOTE — Progress Notes (Addendum)
Subjective No acute events. Stable RUQ discomfort. Denies other complaints at this time.  Offered translator however she said she was comfortable with her command of the [place] language.  Objective: Vital signs in last 24 hours: Temp:  [98.3 F (36.8 C)-99 F (37.2 C)] 99 F (37.2 C) (11/23 0447) Pulse Rate:  [67-76] 74 (11/23 0447) Resp:  [16-18] 17 (11/23 0447) BP: (110-122)/(65-72) 112/65 (11/23 0447) SpO2:  [94 %-98 %] 94 % (11/23 0447)    Intake/Output from previous day: 11/22 0701 - 11/23 0700 In: 1399.1 [I.V.:977; IV Piggyback:422.1] Out: 800 [Urine:800] Intake/Output this shift: No intake/output data recorded.  Gen: NAD, comfortable CV: RRR Pulm: Normal work of breathing Abd: Soft, focally ttp in RUQ; no rebound nor guarding. Nondistended. Ext: SCDs in place  Lab Results: CBC  Recent Labs    10/20/20 0302 10/21/20 0135  WBC 7.6 7.6  HGB 13.8 12.5  HCT 44.1 38.8  PLT 227 194   BMET Recent Labs    10/20/20 0302 10/21/20 0135  NA 138 138  K 4.2 3.6  CL 103 105  CO2 25 24  GLUCOSE 118* 109*  BUN 9 9  CREATININE 0.73 0.70  CALCIUM 9.3 8.4*   PT/INR No results for input(s): LABPROT, INR in the last 72 hours. ABG No results for input(s): PHART, HCO3 in the last 72 hours.  Invalid input(s): PCO2, PO2  Studies/Results:  Anti-infectives: Anti-infectives (From admission, onward)   Start     Dose/Rate Route Frequency Ordered Stop   10/20/20 0430  cefTRIAXone (ROCEPHIN) 2 g in sodium chloride 0.9 % 100 mL IVPB        2 g 200 mL/hr over 30 Minutes Intravenous Every 24 hours 10/20/20 0429     10/20/20 0430  metroNIDAZOLE (FLAGYL) IVPB 500 mg        500 mg 100 mL/hr over 60 Minutes Intravenous Every 8 hours 10/20/20 0429         Assessment/Plan: Patient Active Problem List   Diagnosis Date Noted  . Abdominal pain 10/20/2020  . Vitamin D deficiency 06/22/2019  . Chronic nonintractable headache 03/12/2019  . Edema 03/12/2019  . Essential  hypertension 02/12/2019  . Swelling of both ankles 02/12/2019  . Cholelithiasis 02/21/2014  . Acute appendicitis 02/20/2014   Margaret Singleton is a very pleasant 49yoF with HTN, prior bout diverticulitis now with acute cholecystitis based on symptoms and HIDA scan  -The anatomy and physiology of the hepatobiliary system was discussed with her. The pathophysiology of gallbladder disease was discussed as well. -The options for treatment were discussed including ongoing antibiotics, percutaneous drainage and surgery - with surgery being most definitive. We reviewed laparoscopic cholecystectomy with possible intraoperative cholangiogram -The planned procedures, material risks (including, but not limited to, pain, bleeding, infection, scarring, need for blood transfusion, damage to surrounding structures- blood vessels/nerves/viscus/organs, damage to bile duct, bile leak, need for additional procedures, hernia, worsening of pre-existing medical conditions, pancreatitis, pneumonia, heart attack, stroke, death) benefits and alternatives to surgery were discussed at length. We noted a good probability that the procedure would help improve her symptoms. The patient's questions were answered to her satisfaction, she voiced understanding and elected to proceed with surgery. Additionally, we discussed typical postoperative expectations and the recovery process.   LOS: 0 days   Stephanie Coup. Cliffton Asters, MD FACS [facility] Use [email or web address] to contact on call provider

## 2020-10-21 NOTE — Anesthesia Postprocedure Evaluation (Signed)
Anesthesia Post Note  Patient: Talbot Grumbling  Procedure(s) Performed: LAPAROSCOPIC CHOLECYSTECTOMY (N/A Abdomen) LYSIS OF ADHESION (N/A Abdomen)     Patient location during evaluation: PACU Anesthesia Type: General Level of consciousness: awake and alert Pain management: pain level controlled Vital Signs Assessment: post-procedure vital signs reviewed and stable Respiratory status: spontaneous breathing, nonlabored ventilation, respiratory function stable and patient connected to nasal cannula oxygen Cardiovascular status: blood pressure returned to baseline and stable Postop Assessment: no apparent nausea or vomiting Anesthetic complications: no   No complications documented.  Last Vitals:  Vitals:   10/21/20 1330 10/21/20 1345  BP: 133/81 131/79  Pulse: 72 76  Resp: 20 (!) 23  Temp:  37.1 C  SpO2: 96% 95%    Last Pain:  Vitals:   10/21/20 1345  TempSrc:   PainSc: 3                  Barnabas Henriques DAVID

## 2020-10-21 NOTE — Progress Notes (Signed)
   10/21/20 1449  Assess: MEWS Score  Temp 98.2 F (36.8 C)  BP (!) 146/85  Pulse Rate 94  SpO2 100 %  Assess: MEWS Score  MEWS Temp 0  MEWS Systolic 0  MEWS Pulse 0  MEWS RR 1  MEWS LOC 1  MEWS Score 2  MEWS Score Color Yellow  Assess: if the MEWS score is Yellow or Red  Were vital signs taken at a resting state? Yes  Focused Assessment Change from prior assessment (see assessment flowsheet)  Early Detection of Sepsis Score *See Row Information* Low  MEWS guidelines implemented *See Row Information* No, vital signs rechecked  Treat  Pain Scale 0-10  Pain Score Asleep  Notify: Charge Nurse/RN  Name of Charge Nurse/RN Notified [place], RN (Perioribital swelling)  Date Charge Nurse/RN Notified 10/21/20  Time Charge Nurse/RN Notified 1448  Notify: Provider  Provider Name/Title Marlyne Beards, PA  Date Provider Notified 10/21/20  Time Provider Notified 1449  Notification Type Face-to-face  Notification Reason Change in status  Response See new orders  Date of Provider Response 10/21/20  Time of Provider Response 1452  Document  Patient Outcome Other (Comment) (New med orders)  Progress note created (see row info) Yes

## 2020-10-21 NOTE — Anesthesia Procedure Notes (Signed)
Procedure Name: Intubation Date/Time: 10/21/2020 9:59 AM Performed by: Rosiland Oz, CRNA Pre-anesthesia Checklist: Patient identified, Emergency Drugs available, Suction available, Patient being monitored and Timeout performed Patient Re-evaluated:Patient Re-evaluated prior to induction Oxygen Delivery Method: Circle system utilized Preoxygenation: Pre-oxygenation with 100% oxygen Induction Type: IV induction Ventilation: Mask ventilation without difficulty Laryngoscope Size: Miller and 2 Grade View: Grade I Tube type: Oral Tube size: 7.0 mm Number of attempts: 1 Airway Equipment and Method: Stylet Placement Confirmation: ETT inserted through vocal cords under direct vision,  positive ETCO2 and breath sounds checked- equal and bilateral Secured at: 21 cm Tube secured with: Tape Dental Injury: Teeth and Oropharynx as per pre-operative assessment

## 2020-10-21 NOTE — Progress Notes (Signed)
Patient back to floor from PACU

## 2020-10-21 NOTE — Progress Notes (Signed)
Patient in OR this am for cholecystectomy Discussed with Dr. Cliffton Asters -patient will be transferred under [facility] service,[facility] will sign off .

## 2020-10-21 NOTE — Progress Notes (Signed)
Patient off floor to OR

## 2020-10-21 NOTE — Transfer of Care (Signed)
Immediate Anesthesia Transfer of Care Note  Patient: Margaret Singleton  Procedure(s) Performed: LAPAROSCOPIC CHOLECYSTECTOMY (N/A Abdomen) LYSIS OF ADHESION (N/A Abdomen)  Patient Location: PACU  Anesthesia Type:General  Level of Consciousness: awake, alert , oriented and sedated  Airway & Oxygen Therapy: Patient connected to face mask oxygen  Post-op Assessment: Post -op Vital signs reviewed and stable  Post vital signs: stable  Last Vitals:  Vitals Value Taken Time  BP 120/69 10/21/20 1138  Temp    Pulse 76 10/21/20 1139  Resp 25 10/21/20 1139  SpO2 94 % 10/21/20 1139  Vitals shown include unvalidated device data.  Last Pain:  Vitals:   10/21/20 0447  TempSrc: Oral  PainSc:          Complications: No complications documented.

## 2020-10-21 NOTE — Progress Notes (Signed)
Went in to administer 1400 medication to patient and noticed that her L eye was swollen. Patient still lethargic from surgery. Says that her face felt heavy and itched all over. Moments later I noticed that her R eye began showing signs of swelling as well as a red rash on her R shoulder and L inner thigh. Notified provider , he arrived at bedside and ordered 50 mg of Benadryl and cold application to swollen eye. Will reassess and continue to monitor.

## 2020-10-21 NOTE — Op Note (Signed)
10/19/2020 - 10/21/2020 11:15 AM  PATIENT: Margaret Singleton  49 y.o. female  Patient Care Team: Arnette Felts, FNP as PCP - General (General Practice)  PRE-OPERATIVE DIAGNOSIS: Acute cholecystitis  POST-OPERATIVE DIAGNOSIS:  1. 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 Fitz-Hugh-Curtis adhesions as well  PROCEDURE: Laparoscopic cholecystectomy  SURGEON: Stephanie Coup. Shanti Agresti, MD  ASSISTANT: Trixie Deis, PA-C  ANESTHESIA: General endotracheal  EBL: Total I/O In: 900 [I.V.:900] Out: 20 [Blood:20]  DRAINS: None  SPECIMEN: Gallbladder   COUNTS: Sponge, needle and instrument counts were reported correct x2 at the conclusion of the operation  DISPOSITION: PACU in satisfactory condition  COMPLICATIONS: None  FINDINGS: Fitz-Hugh-Curtis bands lysed sharply to facilitate surgery. Gallbladder is edematous and inflamed in appearance with hydrops. Dilated cystic duct. Critical view of safety achieved before dividing any structures  DESCRIPTION:   The patient was identified & brought into the operating room. She was then positioned supine on the OR table. SCDs were in place and active during the entire case. She then underwent general endotracheal anesthesia. Pressure points were padded. Hair on the abdomen was clipped by the OR team. The abdomen was prepped and draped in the standard sterile fashion. Antibiotics were administered. A surgical timeout was performed and confirmed our plan.    Given her prior surgical history, decision made to proceed with a Veress needle access.  An OG tube was placed per anesthesia and confirmed to be to suction.  At Palmer's point, a Veress needle was inserted in the peritoneal cavity on the first attempt.  Intraperitoneal location was confirmed with the aspiration and saline drop test.  Pneumoperitoneum was then established to 15 mmHg CO2.  At this location, a 5 mm optical viewing trocar was placed.  The laparoscope was inserted and  demonstrated no evidence of Veress needle or trocar site complication.  Three additional 5 mm trochars were placed-one in the supraumbilical location, one in the right lateral abdomen and another between the upper abdominal 5 mm trochars.  The patient was then positioned in reverse Trendelenburg with slight left side down.   The liver and gallbladder were inspected.  She appeared to have [employer] type adhesions involving her liver and abdominal wall.  To facilitate the surgery, these were taken down sharply.  The gallbladder was identified and acutely inflamed in appearance with an edematous wall.  This was tensely dilated.  In order to grasp this it had to be decompressed.  The contents within the gallbladder without of hydrops. The gallbladder fundus was grasped and elevated cephalad. An additional grasper was then placed on the infundibulum of the gallbladder and the infundibulum was retracted laterally. Staying high on the gallbladder, the peritoneum on both sides of the gallbladder was opened with hook cautery. Gentle blunt dissection was then employed with a Art gallery manager working down into [employer]. The cystic duct was identified and carefully circumferentially dissected. The cystic artery was also identified and carefully circumferentially dissected. The space between the cystic artery and hepatocystic plate was developed such that a good view of the liver could be seen through a window medial to the cystic artery. The triangle of Calot had been cleared of all fibrofatty tissue. At this point, a critical view of safety was achieved and the only structures visualized was the skeletonized cystic duct laterally, the skeletonized cystic artery and the liver through the window medial to the artery. No posterior cystic artery was noted.  Her cystic duct is somewhat enlarged and for these reasons, additional time  was spent working to ensure a large window medial to the cystic artery was  developed.  There were no hemolock clips available so the decision made to proceed with a laparoscopic stapler.  The left upper quadrant 5 mm trocar was upsized to a 12 mm trocar.   A cholangiogram was not performed at this point as her cystic duct was quite fragile, thin and friable and concern for potential cystic duct avulsion with a partial ductotomy.   The cystic artery was clipped with 2 clips on the patient side and 1 clip on the specimen side. The cystic duct due to its size and no hemoloc availability was divided with a blue load of the laparoscopic stapler. The gallbladder was then freed from its remaining attachments to the liver using electrocautery and placed into an endocatch bag. The RUQ was gently irrigated with sterile saline. Hemostasis was then verified. The clips were in good position; the gallbladder fossa was dry. The rest of the abdomen was inspected no injury nor bleeding elsewhere was identified.  The endocatch bag containing the gallbladder was then removed from the left upper quadrant port site and passed off as specimen.  The left upper quadrant port site was then closed with a laparoscopic suture passer using a 0 Vicryl suture.  The fascial defect was palpated and noted to be completely closed.  The 5 mm RUQ ports were removed under direct visualization and noted to be hemostatic. The umbilical port was removed after pneumoperitoneum evacuated. The skin of all incision sites was approximated with 4-0 monocryl subcuticular suture and dermabond applied. She was then awakened from anesthesia, extubated, and transferred to a stretcher for transport to PACU in satisfactory condition.

## 2020-10-21 NOTE — Progress Notes (Signed)
Day of Surgery    CC: Abdominal pain  Subjective: She is still having pain in her right upper quadrant.  She is going down to the OR at this time.  Objective: Vital signs in last 24 hours: Temp:  [98.3 F (36.8 C)-99 F (37.2 C)] 99 F (37.2 C) (11/23 0447) Pulse Rate:  [67-76] 74 (11/23 0447) Resp:  [16-18] 17 (11/23 0447) BP: (110-122)/(65-72) 112/65 (11/23 0447) SpO2:  [94 %-98 %] 94 % (11/23 0447)  N.p.o.  1400 IV 800 urine T-max 99 vital signs are stable CMP is stable bilirubin 1.4 WBC 7.6 Intake/Output from previous day: 11/22 0701 - 11/23 0700 In: 1399.1 [I.V.:977; IV Piggyback:422.1] Out: 800 [Urine:800] Intake/Output this shift: No intake/output data recorded.  General appearance: alert, cooperative and no distress Resp: clear to auscultation bilaterally GI: Ongoing pain and tenderness right side right upper quadrant more than the right lower quadrant.  Lab Results:  Recent Labs    10/20/20 0302 10/21/20 0135  WBC 7.6 7.6  HGB 13.8 12.5  HCT 44.1 38.8  PLT 227 194    BMET Recent Labs    10/20/20 0302 10/21/20 0135  NA 138 138  K 4.2 3.6  CL 103 105  CO2 25 24  GLUCOSE 118* 109*  BUN 9 9  CREATININE 0.73 0.70  CALCIUM 9.3 8.4*   PT/INR No results for input(s): LABPROT, INR in the last 72 hours.  Recent Labs  Lab 10/19/20 1938 10/20/20 0302 10/21/20 0135  AST 18 21 14*  ALT 18 20 15   ALKPHOS 92 86 75  BILITOT 0.7 0.7 1.4*  PROT 8.0 8.3* 6.9  ALBUMIN 3.6 3.5 2.9*     Lipase     Component Value Date/Time   LIPASE 45 10/19/2020 1938     Medications: . pantoprazole (PROTONIX) IV  40 mg Intravenous Q24H  . scopolamine  1 patch Transdermal On Call to OR    Assessment/Plan Hx hypertension History of diverticulitis Hx ectopic pregnancy  Abdominal pain, nausea and vomiting Gallstones/sludge/possible acute cholecystitis.  FEN:  IV fluids/NPO ID: Rocephin/Flagyl 11/22 >> day 2 DVT: SCDs added Follow-up: TBD   Plan OR  today.   LOS: 0 days    Margaret Singleton 10/21/2020 Please see Amion

## 2020-10-21 NOTE — Anesthesia Preprocedure Evaluation (Signed)
Anesthesia Evaluation  Patient identified by MRN, date of birth, ID band Patient awake    Reviewed: Allergy & Precautions, NPO status , Patient's Chart, lab work & pertinent test results  Airway Mallampati: I  TM Distance: >3 FB Neck ROM: Full    Dental   Pulmonary    Pulmonary exam normal        Cardiovascular hypertension, Pt. on medications Normal cardiovascular exam     Neuro/Psych    GI/Hepatic   Endo/Other    Renal/GU      Musculoskeletal   Abdominal   Peds  Hematology   Anesthesia Other Findings   Reproductive/Obstetrics                             Anesthesia Physical Anesthesia Plan  ASA: II  Anesthesia Plan: General   Post-op Pain Management:    Induction: Intravenous  PONV Risk Score and Plan: 3 and Midazolam, Dexamethasone and Ondansetron  Airway Management Planned: Oral ETT  Additional Equipment:   Intra-op Plan:   Post-operative Plan: Extubation in OR  Informed Consent: I have reviewed the patients History and Physical, chart, labs and discussed the procedure including the risks, benefits and alternatives for the proposed anesthesia with the patient or authorized representative who has indicated his/her understanding and acceptance.       Plan Discussed with: CRNA and Surgeon  Anesthesia Plan Comments:         Anesthesia Quick Evaluation

## 2020-10-22 ENCOUNTER — Encounter (HOSPITAL_COMMUNITY): Payer: Self-pay | Admitting: Surgery

## 2020-10-22 LAB — COMPREHENSIVE METABOLIC PANEL
ALT: 330 U/L — ABNORMAL HIGH (ref 0–44)
ALT: 348 U/L — ABNORMAL HIGH (ref 0–44)
AST: 394 U/L — ABNORMAL HIGH (ref 15–41)
AST: 357 U/L — ABNORMAL HIGH (ref 15–41)
Albumin: 2.8 g/dL — ABNORMAL LOW (ref 3.5–5.0)
Albumin: 2.7 g/dL — ABNORMAL LOW (ref 3.5–5.0)
Alkaline Phosphatase: 155 U/L — ABNORMAL HIGH (ref 38–126)
Alkaline Phosphatase: 157 U/L — ABNORMAL HIGH (ref 38–126)
Anion gap: 8 (ref 5–15)
Anion gap: 7 (ref 5–15)
BUN: 9 mg/dL (ref 6–20)
BUN: 11 mg/dL (ref 6–20)
CO2: 24 mmol/L (ref 22–32)
CO2: 23 mmol/L (ref 22–32)
Calcium: 8.9 mg/dL (ref 8.9–10.3)
Calcium: 8.7 mg/dL — ABNORMAL LOW (ref 8.9–10.3)
Chloride: 109 mmol/L (ref 98–111)
Chloride: 113 mmol/L — ABNORMAL HIGH (ref 98–111)
Creatinine, Ser: 0.8 mg/dL (ref 0.44–1.00)
Creatinine, Ser: 0.82 mg/dL (ref 0.44–1.00)
GFR, Estimated: >60 mL/min (ref >60)
GFR, Estimated: >60 mL/min (ref >60)
Glucose, Bld: 146 mg/dL — ABNORMAL HIGH (ref 70–99)
Glucose, Bld: 126 mg/dL — ABNORMAL HIGH (ref 70–99)
Potassium: 3.8 mmol/L (ref 3.5–5.1)
Potassium: 3.9 mmol/L (ref 3.5–5.1)
Sodium: 141 mmol/L (ref 135–145)
Sodium: 143 mmol/L (ref 135–145)
Total Bilirubin: 1.5 mg/dL — ABNORMAL HIGH (ref 0.3–1.2)
Total Bilirubin: 1.9 mg/dL — ABNORMAL HIGH (ref 0.3–1.2)
Total Protein: 6.8 g/dL (ref 6.5–8.1)
Total Protein: 6.6 g/dL (ref 6.5–8.1)

## 2020-10-22 LAB — CBC
HCT: 39.1 % (ref 36.0–46.0)
Hemoglobin: 12.5 g/dL (ref 12.0–15.0)
MCH: 28.5 pg (ref 26.0–34.0)
MCHC: 32 g/dL (ref 30.0–36.0)
MCV: 89.3 fL (ref 80.0–100.0)
Platelets: 194 10*3/uL (ref 150–400)
RBC: 4.38 MIL/uL (ref 3.87–5.11)
RDW: 14 % (ref 11.5–15.5)
WBC: 10.3 10*3/uL (ref 4.0–10.5)
nRBC: 0 % (ref 0.0–0.2)

## 2020-10-22 LAB — SURGICAL PATHOLOGY

## 2020-10-22 MED ORDER — PANTOPRAZOLE SODIUM 40 MG PO TBEC: 40.0000 mg | DELAYED_RELEASE_TABLET | Freq: Every day | ORAL | Status: DC

## 2020-10-22 MED ORDER — POLYVINYL ALCOHOL 1.4 % OP SOLN
1.0000 [drp] | OPHTHALMIC | Status: DC | PRN
  Administered 2020-10-22 (×2): 1 [drp] via OPHTHALMIC
  Filled 2020-10-22: qty 15

## 2020-10-22 MED ORDER — DIPHENHYDRAMINE HCL 25 MG PO CAPS
25.0000 mg | ORAL_CAPSULE | Freq: Four times a day (QID) | ORAL | Status: DC | PRN
  Administered 2020-10-22 (×2): 50 mg via ORAL
  Filled 2020-10-22 (×2): qty 2

## 2020-10-22 MED ORDER — ENOXAPARIN SODIUM 40 MG/0.4ML ~~LOC~~ SOLN
40.0000 mg | SUBCUTANEOUS | Status: DC
  Administered 2020-10-22: 40 mg via SUBCUTANEOUS
  Filled 2020-10-22: qty 0.4

## 2020-10-22 NOTE — Progress Notes (Addendum)
1 Day Post-Op    CC:  Abdominal pain  Subjective: Her facial swelling is better this a.m. but still present.  Again the left eye more than the right.  Is also complaining of some itching all over, but I do not see a rash nor does she.  She is sore, but the port sites look fine.  Objective: Vital signs in last 24 hours: Temp:  [97.8 F (36.6 C)-98.9 F (37.2 C)] 97.8 F (36.6 C) (11/24 0507) Pulse Rate:  [67-94] 67 (11/24 0507) Resp:  [17-24] 18 (11/24 0507) BP: (105-149)/(60-90) 105/60 (11/24 0507) SpO2:  [90 %-100 %] 97 % (11/24 0507)  530 PO 1298 IV 1500 urine Afebrile VSS LFT's up Intake/Output from previous day: 11/23 0701 - 11/24 0700 In: 1828.1 [P.O.:530; I.V.:1298.1] Out: 1520 [Urine:1500; Blood:20] Intake/Output this shift: Total I/O In: 928.1 [P.O.:530; I.V.:398.1] Out: 200 [Urine:200]  General appearance: alert, cooperative, no distress and uncooperative, complains of itching and is still somewhat anxious.  She continues to have some facial swelling especially under her eyes the left eye more so than the right.  It is better than it was yesterday when we were called. Resp: clear to auscultation bilaterally Cardio: Regular rate and rhythm GI: Large abdomen, sore some minimal redness around the port sites Skin: Skin color, texture, turgor normal. No rashes or lesions or I do not see any rashes nor does the patient see any rashes.  Lab Results:  Recent Labs    10/21/20 0135 10/22/20 0026  WBC 7.6 10.3  HGB 12.5 12.5  HCT 38.8 39.1  PLT 194 194    BMET Recent Labs    10/21/20 0135 10/22/20 0026  NA 138 141  K 3.6 3.8  CL 105 109  CO2 24 24  GLUCOSE 109* 146*  BUN 9 9  CREATININE 0.70 0.80  CALCIUM 8.4* 8.9   PT/INR No results for input(s): LABPROT, INR in the last 72 hours.  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 10/23/20 Post op allergic reaction - eye swelling L>R  FEN:IV fluids/regular diet Marin Olp 11/22 -10/21/20 DVT: SCDs added; add Lovenox later today. Follow-up: TBD Pain: Tylenol 650x3 Toradol x1; tramadol x1  Plan: Continue p.o. Benadryl, advance her diet, mobilize today, decrease IV fluids.  We will recheck her labs in the a.m.      LOS: 1 day    Raghad Lorenz 10/22/2020 Please see Amion

## 2020-10-23 LAB — COMPREHENSIVE METABOLIC PANEL
ALT: 251 U/L — ABNORMAL HIGH (ref 0–44)
AST: 151 U/L — ABNORMAL HIGH (ref 15–41)
Albumin: 2.5 g/dL — ABNORMAL LOW (ref 3.5–5.0)
Alkaline Phosphatase: 155 U/L — ABNORMAL HIGH (ref 38–126)
Anion gap: 7 (ref 5–15)
BUN: 11 mg/dL (ref 6–20)
CO2: 25 mmol/L (ref 22–32)
Calcium: 8.3 mg/dL — ABNORMAL LOW (ref 8.9–10.3)
Chloride: 109 mmol/L (ref 98–111)
Creatinine, Ser: 0.74 mg/dL (ref 0.44–1.00)
GFR, Estimated: >60 mL/min (ref >60)
Glucose, Bld: 90 mg/dL (ref 70–99)
Potassium: 4.1 mmol/L (ref 3.5–5.1)
Sodium: 141 mmol/L (ref 135–145)
Total Bilirubin: 0.8 mg/dL (ref 0.3–1.2)
Total Protein: 6 g/dL — ABNORMAL LOW (ref 6.5–8.1)

## 2020-10-23 LAB — CBC
HCT: 34.9 % — ABNORMAL LOW (ref 36.0–46.0)
Hemoglobin: 11.4 g/dL — ABNORMAL LOW (ref 12.0–15.0)
MCH: 28.7 pg (ref 26.0–34.0)
MCHC: 32.7 g/dL (ref 30.0–36.0)
MCV: 87.9 fL (ref 80.0–100.0)
Platelets: 183 10*3/uL (ref 150–400)
RBC: 3.97 MIL/uL (ref 3.87–5.11)
RDW: 14.6 % (ref 11.5–15.5)
WBC: 10.1 10*3/uL (ref 4.0–10.5)
nRBC: 0 % (ref 0.0–0.2)

## 2020-10-23 MED ORDER — TRAMADOL HCL 50 MG PO TABS
50.0000 mg | ORAL_TABLET | Freq: Four times a day (QID) | ORAL | 0 refills | Status: DC | PRN
Start: 2020-10-23 — End: 2021-09-25

## 2020-10-23 MED ORDER — DIPHENHYDRAMINE HCL 25 MG PO CAPS
25.0000 mg | ORAL_CAPSULE | Freq: Four times a day (QID) | ORAL | Status: DC | PRN
Start: 2020-10-23 — End: 2021-02-26

## 2020-10-23 MED ORDER — ACETAMINOPHEN 325 MG PO TABS: 650.0000 mg | ORAL_TABLET | Freq: Four times a day (QID) | ORAL | Status: DC | PRN

## 2020-10-23 MED ORDER — IBUPROFEN 200 MG PO TABS
600.0000 mg | ORAL_TABLET | Freq: Four times a day (QID) | ORAL | Status: DC | PRN
Start: 2020-10-23 — End: 2021-02-26

## 2020-10-23 NOTE — Plan of Care (Signed)
D/c to home with family pain controled. VSS breathing regular and un labored on RA. Incisions CDI. Sweling perioribtal improved. No rash on assesment. Instructions gvien with understanding

## 2020-10-23 NOTE — Discharge Instructions (Signed)
Your eye pain is likely related to swelling from the allergic reaction that you had. I recommend continuing cold compresses and taking benadryl as needed for itchy or watery eyes. If symptoms are not improving over the next 2-3 days I recommend calling for an ophthalmology appointment or seeing someone at a primary care or urgent care office.    [facility] LAPAROSCOPIC SURGERY: POST OP INSTRUCTIONS Always review your discharge instruction sheet given to you by the facility where your surgery was performed. IF YOU HAVE DISABILITY OR FAMILY LEAVE FORMS, YOU MUST BRING THEM TO THE OFFICE FOR PROCESSING.   DO NOT GIVE THEM TO YOUR DOCTOR.  PAIN CONTROL  1. First take acetaminophen (Tylenol) AND/or ibuprofen (Advil) to control your pain after surgery.  Follow directions on package.  Taking acetaminophen (Tylenol) and/or ibuprofen (Advil) regularly after surgery will help to control your pain and lower the amount of prescription pain medication you may need.  You should not take more than 3,000 mg (3 grams) of acetaminophen (Tylenol) in 24 hours.  You should not take ibuprofen (Advil), aleve, motrin, naprosyn or other NSAIDS if you have a history of stomach ulcers or chronic kidney disease.  2. A prescription for pain medication may be given to you upon discharge.  Take your pain medication as prescribed, if you still have uncontrolled pain after taking acetaminophen (Tylenol) or ibuprofen (Advil). 3. Use ice packs to help control pain. 4. If you need a refill on your pain medication, please contact your pharmacy.  They will contact our office to request authorization. Prescriptions will not be filled after 5pm or on week-ends.  HOME MEDICATIONS 5. Take your usually prescribed medications unless otherwise directed.  DIET 6. You should follow a light diet the first few days after arrival home.  Be sure to include lots of fluids daily. Avoid fatty, fried foods.    CONSTIPATION 7. It is common to experience some constipation after surgery and if you are taking pain medication.  Increasing fluid intake and taking a stool softener (such as Colace) will usually help or prevent this problem from occurring.  A mild laxative (Milk of Magnesia or Miralax) should be taken according to package instructions if there are no bowel movements after 48 hours.  WOUND/INCISION CARE 8. Most patients will experience some swelling and bruising in the area of the incisions.  Ice packs will help.  Swelling and bruising can take several days to resolve.  9. Unless discharge instructions indicate otherwise, follow guidelines below  a. STERI-STRIPS - you may remove your outer bandages 48 hours after surgery, and you may shower at that time.  You have steri-strips (small skin tapes) in place directly over the incision.  These strips should be left on the skin for 7-10 days.   b. DERMABOND/SKIN GLUE - you may shower in 24 hours.  The glue will flake off over the next 2-3 weeks. 10. Any sutures or staples will be removed at the office during your follow-up visit.  ACTIVITIES 11. You may resume regular (light) daily activities beginning the next day--such as daily self-care, walking, climbing stairs--gradually increasing activities as tolerated.  You may have sexual intercourse when it is comfortable.  Refrain from any heavy lifting or straining until approved by your doctor. a. You may drive when you are no longer taking prescription pain medication, you can comfortably wear a seatbelt, and you can safely maneuver your car and apply brakes.  FOLLOW-UP 12. You should see your doctor  in the office for a follow-up appointment approximately 2-3 weeks after your surgery.  You should have been given your post-op/follow-up appointment when your surgery was scheduled.  If you did not receive a post-op/follow-up appointment, make sure that you call for this appointment within a day or two after  you arrive home to insure a convenient appointment time.   WHEN TO CALL YOUR DOCTOR: 1. Fever over 101.0 2. Inability to urinate 3. Continued bleeding from incision. 4. Increased pain, redness, or drainage from the incision. 5. Increasing abdominal pain  The clinic staff is available to answer your questions during regular business hours.  Please dont hesitate to call and ask to speak to one of the nurses for clinical concerns.  If you have a medical emergency, go to the nearest emergency room or call 911.  A surgeon from [facility] is always on call at the hospital. [address], [place], [place]  [address] ? [address] [address], [place], [place]   [address] [phone] ? [phone] ? FAX [phone] Web site: [email or web address]     Managing Your Pain After Surgery Without Opioids    Thank you for participating in our program to help patients manage their pain after surgery without opioids. This is part of our effort to provide you with the best care possible, without exposing you or your family to the risk that opioids pose.  What pain can I expect after surgery? You can expect to have some pain after surgery. This is normal. The pain is typically worse the day after surgery, and quickly begins to get better. Many studies have found that many patients are able to manage their pain after surgery with Over-the-Counter (OTC) medications such as Tylenol and Motrin. If you have a condition that does not allow you to take Tylenol or Motrin, notify your surgical team.  How will I manage my pain? The best strategy for controlling your pain after surgery is around the clock pain control with Tylenol (acetaminophen) and Motrin (ibuprofen or Advil). Alternating these medications with each other allows you to maximize your pain control. In addition to Tylenol and Motrin, you can use heating pads or ice packs on your incisions to help reduce your  pain.  How will I alternate your regular strength over-the-counter pain medication? You will take a dose of pain medication every three hours. ; Start by taking 650 mg of Tylenol (2 pills of 325 mg) ; 3 hours later take 600 mg of Motrin (3 pills of 200 mg) ; 3 hours after taking the Motrin take 650 mg of Tylenol ; 3 hours after that take 600 mg of Motrin.   - 1 -  See example - if your first dose of Tylenol is at 12:00 PM   12:00 PM Tylenol 650 mg (2 pills of 325 mg)  3:00 PM Motrin 600 mg (3 pills of 200 mg)  6:00 PM Tylenol 650 mg (2 pills of 325 mg)  9:00 PM Motrin 600 mg (3 pills of 200 mg)  Continue alternating every 3 hours   We recommend that you follow this schedule around-the-clock for at least 3 days after surgery, or until you feel that it is no longer needed. Use the table on the last page of this handout to keep track of the medications you are taking. Important: Do not take more than 3000mg  of Tylenol or 3200mg  of Motrin in a 24-hour period. Do not take ibuprofen/Motrin if you have a history of  bleeding stomach ulcers, severe kidney disease, &/or actively taking a blood thinner  What if I still have pain? If you have pain that is not controlled with the over-the-counter pain medications (Tylenol and Motrin or Advil) you might have what we call breakthrough pain. You will receive a prescription for a small amount of an opioid pain medication such as Oxycodone, Tramadol, or Tylenol with Codeine. Use these opioid pills in the first 24 hours after surgery if you have breakthrough pain. Do not take more than 1 pill every 4-6 hours.  If you still have uncontrolled pain after using all opioid pills, don't hesitate to call our staff using the number provided. We will help make sure you are managing your pain in the best way possible, and if necessary, we can provide a prescription for additional pain medication.   Day 1    Time  Name of Medication Number of pills taken   Amount of Acetaminophen  Pain Level   Comments  AM PM       AM PM       AM PM       AM PM       AM PM       AM PM       AM PM       AM PM       Total Daily amount of Acetaminophen Do not take more than  3,000 mg per day      Day 2    Time  Name of Medication Number of pills taken  Amount of Acetaminophen  Pain Level   Comments  AM PM       AM PM       AM PM       AM PM       AM PM       AM PM       AM PM       AM PM       Total Daily amount of Acetaminophen Do not take more than  3,000 mg per day      Day 3    Time  Name of Medication Number of pills taken  Amount of Acetaminophen  Pain Level   Comments  AM PM       AM PM       AM PM       AM PM          AM PM       AM PM       AM PM       AM PM       Total Daily amount of Acetaminophen Do not take more than  3,000 mg per day      Day 4    Time  Name of Medication Number of pills taken  Amount of Acetaminophen  Pain Level   Comments  AM PM       AM PM       AM PM       AM PM       AM PM       AM PM       AM PM       AM PM       Total Daily amount of Acetaminophen Do not take more than  3,000 mg per day      Day 5    Time  Name of Medication Number of pills taken  Amount  of Acetaminophen  Pain Level   Comments  AM PM       AM PM       AM PM       AM PM       AM PM       AM PM       AM PM       AM PM       Total Daily amount of Acetaminophen Do not take more than  3,000 mg per day       Day 6    Time  Name of Medication Number of pills taken  Amount of Acetaminophen  Pain Level  Comments  AM PM       AM PM       AM PM       AM PM       AM PM       AM PM       AM PM       AM PM       Total Daily amount of Acetaminophen Do not take more than  3,000 mg per day      Day 7    Time  Name of Medication Number of pills taken  Amount of Acetaminophen  Pain Level   Comments  AM PM       AM PM       AM PM       AM PM       AM PM       AM PM        AM PM       AM PM       Total Daily amount of Acetaminophen Do not take more than  3,000 mg per day        For additional information about how and where to safely dispose of unused opioid medications - [employer]  Disclaimer: This document contains information and/or instructional materials adapted from [facility] for the typical patient with your condition. It does not replace medical advice from your health care provider because your experience may differ from that of the typical patient. Talk to your health care provider if you have any questions about this document, your condition or your treatment plan. Adapted from [facility]

## 2020-10-28 ENCOUNTER — Telehealth: Payer: Self-pay

## 2020-10-28 NOTE — Telephone Encounter (Signed)
Per JM: Call to see how she is doing since being admitted for surgery Pt VM full

## 2020-10-28 NOTE — Telephone Encounter (Signed)
Attempted TCM call. lvm

## 2021-02-26 ENCOUNTER — Other Ambulatory Visit: Payer: Self-pay

## 2021-02-26 ENCOUNTER — Ambulatory Visit (INDEPENDENT_AMBULATORY_CARE_PROVIDER_SITE_OTHER): Payer: BC Managed Care – PPO | Admitting: Nurse Practitioner

## 2021-02-26 ENCOUNTER — Encounter: Payer: Self-pay | Admitting: Nurse Practitioner

## 2021-02-26 VITALS — BP 122/80 | HR 86 | Temp 98.5°F | Ht <= 58 in | Wt 201.0 lb

## 2021-02-26 DIAGNOSIS — Z6841 Body Mass Index (BMI) 40.0 and over, adult: Secondary | ICD-10-CM | POA: Diagnosis not present

## 2021-02-26 DIAGNOSIS — I10 Essential (primary) hypertension: Secondary | ICD-10-CM

## 2021-02-26 DIAGNOSIS — Z23 Encounter for immunization: Secondary | ICD-10-CM

## 2021-02-26 DIAGNOSIS — M25471 Effusion, right ankle: Secondary | ICD-10-CM

## 2021-02-26 DIAGNOSIS — M25472 Effusion, left ankle: Secondary | ICD-10-CM

## 2021-02-26 DIAGNOSIS — Z1159 Encounter for screening for other viral diseases: Secondary | ICD-10-CM

## 2021-02-26 MED ORDER — HYDROCHLOROTHIAZIDE 12.5 MG PO TABS: ORAL_TABLET | ORAL | 1 refills | Status: DC

## 2021-02-26 NOTE — Progress Notes (Signed)
This visit occurred during the SARS-CoV-2 public health emergency.  Safety protocols were in place, including screening questions prior to the visit, additional usage of staff PPE, and extensive cleaning of exam room while observing appropriate contact time as indicated for disinfecting solutions.  Subjective:     Patient ID: Margaret Singleton , female    DOB: [birth year] , 50 y.o.   MRN: [id number]   Chief Complaint  Patient presents with  . Hypertension    HPI  Patient presents today for a blood pressure f/u. She has been without her HCTZ for more than 3 months.   Wt Readings from Last 3 Encounters: 02/26/21 : 201 lb (91.2 kg) 03/26/20 : 196 lb (88.9 kg) 06/20/19 : 191 lb (86.6 kg)  Hypertension This is a chronic problem. The current episode started more than 1 year ago. The problem has been gradually improving since onset. The problem is controlled. Pertinent negatives include no anxiety, chest pain, headaches or palpitations. There are no associated agents to hypertension. Risk factors for coronary artery disease include obesity and sedentary lifestyle. Past treatments include nothing. Compliance problems include exercise (reports she walks alot at work).  There is no history of angina. There is no history of chronic renal disease.     Past Medical History:  Diagnosis Date  . Endometrial polyp   . History of diverticulitis of colon 02/2017  . History of ectopic pregnancy   . Hypertension   . PMB (postmenopausal bleeding)   . Thickened endometrium      Family History  Problem Relation Age of Onset  . Healthy Mother   . Healthy Father      Current Outpatient Medications:  .  hydrochlorothiazide (HYDRODIURIL) 12.5 MG tablet, Take 1 tablet by mouth daily as needed for leg swelling, Disp: 90 tablet, Rfl: 1 .  traMADol (ULTRAM) 50 MG tablet, Take 1-2 tablets (50-100 mg total) by mouth every 6 (six) hours as needed for moderate pain or severe pain. (Patient not taking: Reported  on 02/26/2021), Disp: 20 tablet, Rfl: 0   Allergies  Allergen Reactions  . Oxycodone Itching     Review of Systems  Constitutional: Negative.  Negative for fatigue.  Respiratory: Negative.  Negative for cough.   Cardiovascular: Negative.  Negative for chest pain, palpitations and leg swelling.  Neurological: Negative for dizziness, facial asymmetry and headaches.  Psychiatric/Behavioral: Negative.      Today's Vitals   02/26/21 1444  BP: 122/80  Pulse: 86  Temp: 98.5 F (36.9 C)  TempSrc: Oral  Weight: 201 lb (91.2 kg)  Height: 4' 9.4" (1.458 m)  PainSc: 0-No pain   Body mass index is 42.89 kg/m.   Objective:  Physical Exam Vitals reviewed.  Constitutional:      General: She is not in acute distress.    Appearance: Normal appearance. She is well-developed. She is obese.  HENT:     Head: Normocephalic and atraumatic.     Right Ear: Hearing normal.     Left Ear: Hearing normal.  Eyes:     General: Lids are normal.     Extraocular Movements: Extraocular movements intact.     Conjunctiva/sclera: Conjunctivae normal.     Pupils: Pupils are equal, round, and reactive to light.     Funduscopic exam:    Right eye: No papilledema.        Left eye: No papilledema.  Neck:     Thyroid: No thyroid mass.     Vascular: No carotid bruit.  Cardiovascular:  Rate and Rhythm: Normal rate and regular rhythm.     Pulses: Normal pulses.     Heart sounds: Normal heart sounds. No murmur heard.   Pulmonary:     Effort: Pulmonary effort is normal. No respiratory distress.     Breath sounds: Normal breath sounds. No wheezing.  Musculoskeletal:        General: No swelling.     Cervical back: Full passive range of motion without pain and neck supple.     Right lower leg: Edema (trace) present.     Left lower leg: Edema (trace) present.  Skin:    General: Skin is warm and dry.     Capillary Refill: Capillary refill takes less than 2 seconds.  Neurological:     General: No focal  deficit present.     Mental Status: She is alert and oriented to person, place, and time.     Cranial Nerves: No cranial nerve deficit.     Sensory: No sensory deficit.  Psychiatric:        Mood and Affect: Mood normal.        Behavior: Behavior normal.        Thought Content: Thought content normal.        Judgment: Judgment normal.         Assessment And Plan:     1. Essential hypertension . B/P is controlled.  . CMP ordered to check renal function.  . The importance of regular exercise and dietary modification was stressed to the patient.  . Stressed importance of losing ten percent of her body weight to help with B/P control.  - BMP8+eGFR  2. Need for influenza vaccination  Influenza vaccine administered  Encouraged to take Tylenol as needed for fever or muscle aches. - Flu Vaccine QUAD 6+ mos PF IM (Fluarix Quad PF)  3. BMI 40.0-44.9, adult (HCC)  Chronic  Discussed healthy diet and regular exercise options   Encouraged to exercise at least 150 minutes per week with 2 days of strength training  Will refer her to healthy weight and wellness. - Hemoglobin A1c - Insulin, random(561) - Amb Ref to Medical Weight Management  4. Encounter for hepatitis C screening test for low risk patient  Will check Hepatitis C screening due to recent recommendations to screen all adults 18 years and older - Hepatitis C antibody  5. Swelling of both ankles She has been having intermittent swelling, trace noted today She can use the HCTZ for lower extremity swelling   Patient was given opportunity to ask questions. Patient verbalized understanding of the plan and was able to repeat key elements of the plan. All questions were answered to their satisfaction.  Minette Brine, FNP   I, Minette Brine, FNP, have reviewed all documentation for this visit. The documentation on 02/26/21 for the exam, diagnosis, procedures, and orders are all accurate and complete.   IF YOU HAVE BEEN  REFERRED TO A SPECIALIST, IT MAY TAKE 1-2 WEEKS TO SCHEDULE/PROCESS THE REFERRAL. IF YOU HAVE NOT HEARD FROM US/SPECIALIST IN TWO WEEKS, PLEASE GIVE [place] A CALL AT [phone].   THE PATIENT IS ENCOURAGED TO PRACTICE SOCIAL DISTANCING DUE TO THE COVID-19 PANDEMIC.

## 2021-02-26 NOTE — Patient Instructions (Addendum)
Hypertension, Adult Hypertension is another name for high blood pressure. High blood pressure forces your heart to work harder to pump blood. This can cause problems over time. There are two numbers in a blood pressure reading. There is a top number (systolic) over a bottom number (diastolic). It is best to have a blood pressure that is below 120/80. Healthy choices can help lower your blood pressure, or you may need medicine to help lower it. What are the causes? The cause of this condition is not known. Some conditions may be related to high blood pressure. What increases the risk?  Smoking.  Having type 2 diabetes mellitus, high cholesterol, or both.  Not getting enough exercise or physical activity.  Being overweight.  Having too much fat, sugar, calories, or salt (sodium) in your diet.  Drinking too much alcohol.  Having long-term (chronic) kidney disease.  Having a family history of high blood pressure.  Age. Risk increases with age.  Race. You may be at higher risk if you are African American.  Gender. Men are at higher risk than women before age 45. After age 65, women are at higher risk than men.  Having obstructive sleep apnea.  Stress. What are the signs or symptoms?  High blood pressure may not cause symptoms. Very high blood pressure (hypertensive crisis) may cause: ? Headache. ? Feelings of worry or nervousness (anxiety). ? Shortness of breath. ? Nosebleed. ? A feeling of being sick to your stomach (nausea). ? Throwing up (vomiting). ? Changes in how you see. ? Very bad chest pain. ? Seizures. How is this treated?  This condition is treated by making healthy lifestyle changes, such as: ? Eating healthy foods. ? Exercising more. ? Drinking less alcohol.  Your health care provider may prescribe medicine if lifestyle changes are not enough to get your blood pressure under control, and if: ? Your top number is above 130. ? Your bottom number is above  80.  Your personal target blood pressure may vary. Follow these instructions at home: Eating and drinking  If told, follow the DASH eating plan. To follow this plan: ? Fill one half of your plate at each meal with fruits and vegetables. ? Fill one fourth of your plate at each meal with whole grains. Whole grains include whole-wheat pasta, brown rice, and whole-grain bread. ? Eat or drink low-fat dairy products, such as skim milk or low-fat yogurt. ? Fill one fourth of your plate at each meal with low-fat (lean) proteins. Low-fat proteins include fish, chicken without skin, eggs, beans, and tofu. ? Avoid fatty meat, cured and processed meat, or chicken with skin. ? Avoid pre-made or processed food.  Eat less than 1,500 mg of salt each day.  Do not drink alcohol if: ? Your doctor tells you not to drink. ? You are pregnant, may be pregnant, or are planning to become pregnant.  If you drink alcohol: ? Limit how much you use to:  0-1 drink a day for women.  0-2 drinks a day for men. ? Be aware of how much alcohol is in your drink. In the [place] one drink equals one 12 oz bottle of beer (355 mL), one 5 oz glass of wine (148 mL), or one 1 oz glass of hard liquor (44 mL).   Lifestyle  Work with your doctor to stay at a healthy weight or to lose weight. Ask your doctor what the best weight is for you.  Get at least 30 minutes of exercise most   days of the week. This may include walking, swimming, or biking.  Get at least 30 minutes of exercise that strengthens your muscles (resistance exercise) at least 3 days a week. This may include lifting weights or doing Pilates.  Do not use any products that contain nicotine or tobacco, such as cigarettes, e-cigarettes, and chewing tobacco. If you need help quitting, ask your doctor.  Check your blood pressure at home as told by your doctor.  Keep all follow-up visits as told by your doctor. This is important.   Medicines  Take over-the-counter  and prescription medicines only as told by your doctor. Follow directions carefully.  Do not skip doses of blood pressure medicine. The medicine does not work as well if you skip doses. Skipping doses also puts you at risk for problems.  Ask your doctor about side effects or reactions to medicines that you should watch for. Contact a doctor if you:  Think you are having a reaction to the medicine you are taking.  Have headaches that keep coming back (recurring).  Feel dizzy.  Have swelling in your ankles.  Have trouble with your vision. Get help right away if you:  Get a very bad headache.  Start to feel mixed up (confused).  Feel weak or numb.  Feel faint.  Have very bad pain in your: ? Chest. ? Belly (abdomen).  Throw up more than once.  Have trouble breathing. Summary  Hypertension is another name for high blood pressure.  High blood pressure forces your heart to work harder to pump blood.  For most people, a normal blood pressure is less than 120/80.  Making healthy choices can help lower blood pressure. If your blood pressure does not get lower with healthy choices, you may need to take medicine. This information is not intended to replace advice given to you by your health care provider. Make sure you discuss any questions you have with your health care provider. Document Revised: 07/26/2018 Document Reviewed: 07/26/2018 Elsevier Patient Education  2021 [employer]   Influenza, Adult Influenza is also called "the flu." It is an infection in the lungs, nose, and throat (respiratory tract). It spreads easily from person to person (is contagious). The flu causes symptoms that are like a cold, along with high fever and body aches. What are the causes? This condition is caused by the influenza virus. You can get the virus by:  Breathing in droplets that are in the air after a person infected with the flu coughed or sneezed.  Touching something that has the  virus on it and then touching your mouth, nose, or eyes. What increases the risk? Certain things may make you more likely to get the flu. These include:  Not washing your hands often.  Having close contact with many people during cold and flu season.  Touching your mouth, eyes, or nose without first washing your hands.  Not getting a flu shot every year. You may have a higher risk for the flu, and serious problems, such as a lung infection (pneumonia), if you:  Are older than 65.  Are pregnant.  Have a weakened disease-fighting system (immune system) because of a disease or because you are taking certain medicines.  Have a long-term (chronic) condition, such as: ? Heart, kidney, or lung disease. ? Diabetes. ? Asthma.  Have a liver disorder.  Are very overweight (morbidly obese).  Have anemia. What are the signs or symptoms? Symptoms usually begin suddenly and last 4-14 days. They may include:  Fever and chills.  Headaches, body aches, or muscle aches.  Sore throat.  Cough.  Runny or stuffy (congested) nose.  Feeling discomfort in your chest.  Not wanting to eat as much as normal.  Feeling weak or tired.  Feeling dizzy.  Feeling sick to your stomach or throwing up. How is this treated? If the flu is found early, you can be treated with antiviral medicine. This can help to reduce how bad the illness is and how long it lasts. This may be given by mouth or through an IV tube. Taking care of yourself at home can help your symptoms get better. Your doctor may want you to:  Take over-the-counter medicines.  Drink plenty of fluids. The flu often goes away on its own. If you have very bad symptoms or other problems, you may be treated in a hospital. Follow these instructions at home: Activity  Rest as needed. Get plenty of sleep.  Stay home from work or school as told by your doctor. ? Do not leave home until you do not have a fever for 24 hours without taking  medicine. ? Leave home only to go to your doctor. Eating and drinking  Take an ORS (oral rehydration solution). This is a drink that is sold at pharmacies and stores.  Drink enough fluid to keep your pee pale yellow.  Drink clear fluids in small amounts as you are able. Clear fluids include: ? Water. ? Ice chips. ? Fruit juice mixed with water. ? Low-calorie sports drinks.  Eat bland foods that are easy to digest. Eat small amounts as you are able. These foods include: ? Bananas. ? Applesauce. ? Rice. ? Lean meats. ? Toast. ? Crackers.  Do not eat or drink: ? Fluids that have a lot of sugar or caffeine. ? Alcohol. ? Spicy or fatty foods. General instructions  Take over-the-counter and prescription medicines only as told by your doctor.  Use a cool mist humidifier to add moisture to the air in your home. This can make it easier for you to breathe. ? When using a cool mist humidifier, clean it daily. Empty water and replace with clean water.  Cover your mouth and nose when you cough or sneeze.  Wash your hands with soap and water often and for at least 20 seconds. This is also important after you cough or sneeze. If you cannot use soap and water, use alcohol-based hand sanitizer.  Keep all follow-up visits.      How is this prevented?  Get a flu shot every year. You may get the flu shot in late summer, fall, or winter. Ask your doctor when you should get your flu shot.  Avoid contact with people who are sick during fall and winter. This is cold and flu season.   Contact a doctor if:  You get new symptoms.  You have: ? Chest pain. ? Watery poop (diarrhea). ? A fever.  Your cough gets worse.  You start to have more mucus.  You feel sick to your stomach.  You throw up. Get help right away if you:  Have shortness of breath.  Have trouble breathing.  Have skin or nails that turn a bluish color.  Have very bad pain or stiffness in your neck.  Get a  sudden headache.  Get sudden pain in your face or ear.  Cannot eat or drink without throwing up. These symptoms may represent a serious problem that is an emergency. Get medical help right away. Call  your local emergency services (911 in the [place]  Do not wait to see if the symptoms will go away.  Do not drive yourself to the hospital. Summary  Influenza is also called "the flu." It is an infection in the lungs, nose, and throat. It spreads easily from person to person.  Take over-the-counter and prescription medicines only as told by your doctor.  Getting a flu shot every year is the best way to not get the flu. This information is not intended to replace advice given to you by your health care provider. Make sure you discuss any questions you have with your health care provider. Document Revised: 07/04/2020 Document Reviewed: 07/04/2020 Elsevier Patient Education  2021 [employer].

## 2021-02-27 LAB — HEMOGLOBIN A1C
Est. average glucose Bld gHb Est-mCnc: 114 mg/dL
Hgb A1c MFr Bld: 5.6 % (ref 4.8–5.6)

## 2021-02-27 LAB — BMP8+EGFR
BUN/Creatinine Ratio: 18 (ref 9–23)
BUN: 15 mg/dL (ref 6–24)
CO2: 25 mmol/L (ref 20–29)
Calcium: 9.2 mg/dL (ref 8.7–10.2)
Chloride: 107 mmol/L — ABNORMAL HIGH (ref 96–106)
Creatinine, Ser: 0.83 mg/dL (ref 0.57–1.00)
Glucose: 80 mg/dL (ref 65–99)
Potassium: 4.2 mmol/L (ref 3.5–5.2)
Sodium: 146 mmol/L — ABNORMAL HIGH (ref 134–144)
eGFR: 85 mL/min/{1.73_m2} (ref >59)

## 2021-02-27 LAB — HEPATITIS C ANTIBODY: Hep C Virus Ab: <0.1 s/co ratio (ref 0.0–0.9)

## 2021-02-27 LAB — INSULIN, RANDOM: INSULIN: 145 u[IU]/mL — ABNORMAL HIGH (ref 2.6–24.9)

## 2021-06-06 IMAGING — CT CT ABD-PELV W/ CM
2 of 5 series · 16 of 46 positions shown, 18 images · IV contrast (Omni 300)
Comparison: Abdominal ultrasound 10/20/2020. CT of the abdomen
pelvis with contrast 02/20/13

CLINICAL DATA: Abdominal pain, acute, nonlocalized. Epigastric pain
and nausea beginning yesterday morning.

EXAM:
CT ABDOMEN AND PELVIS WITH CONTRAST
TECHNIQUE: Multidetector CT imaging of the abdomen and pelvis was performed
using the standard protocol following bolus administration of
intravenous contrast.
CONTRAST:  100mL OMNIPAQUE IOHEXOL 300 MG/ML  SOLN

[Series 3: a/p w/ 5mm · axial · 0.94mm/px · z∈[+730,+1155]mm · 13 of 95 slices shown, 15 images]
[im 5/95  soft-tissue]
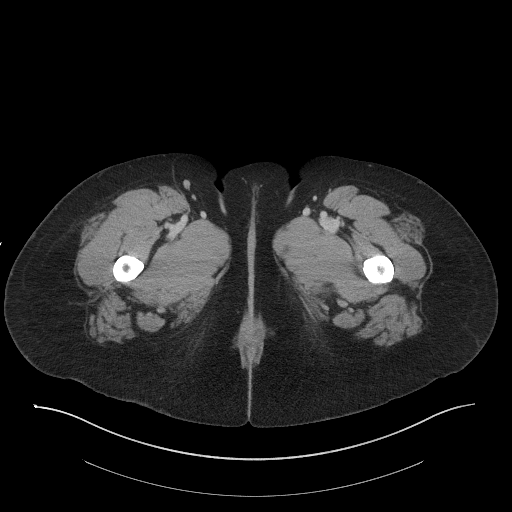
[im 5/95  bone]
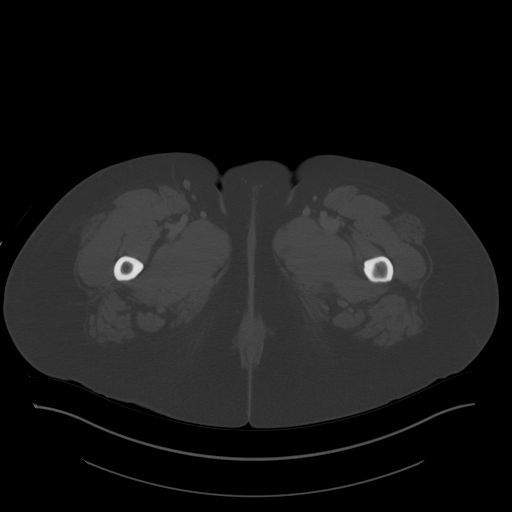
[im 15/95  soft-tissue]
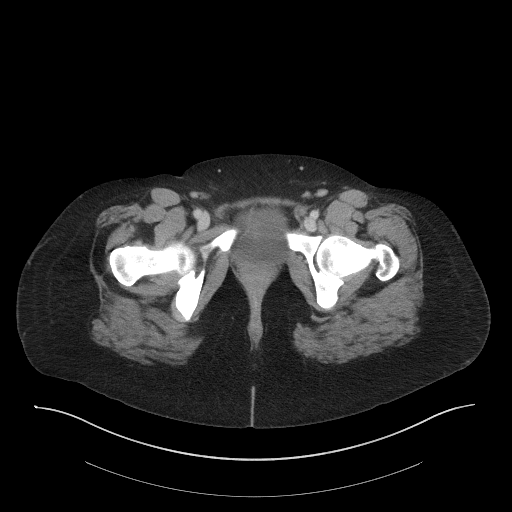
[im 20/95  soft-tissue]
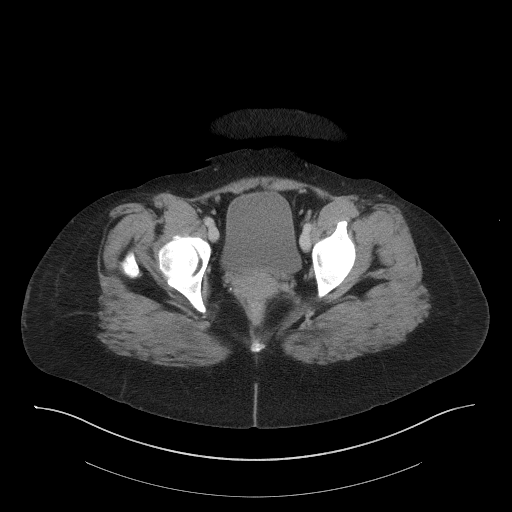
[im 25/95  soft-tissue]
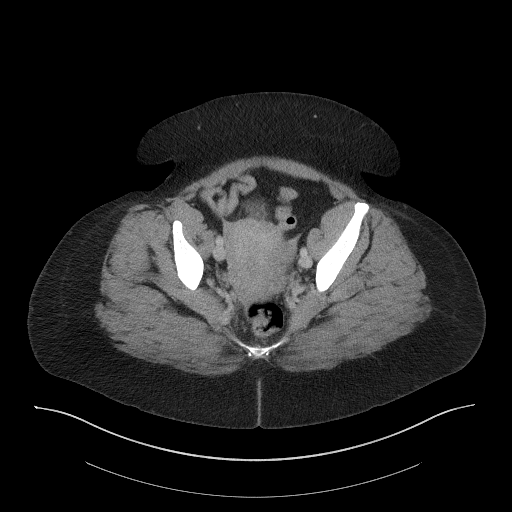
[im 35/95  soft-tissue]
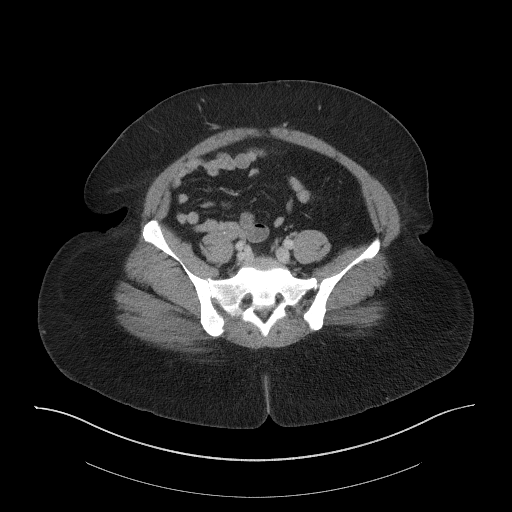
[im 40/95  soft-tissue]
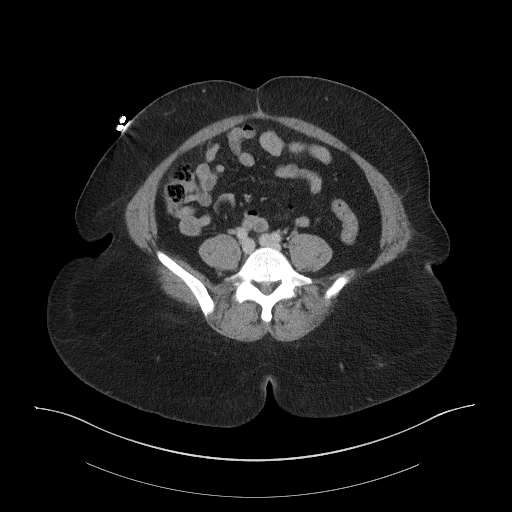
[im 50/95  soft-tissue]
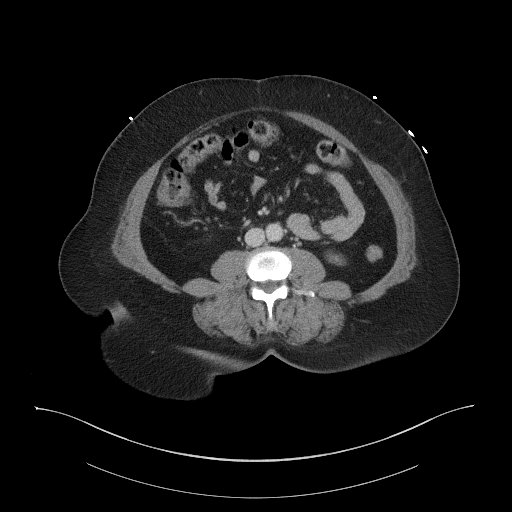
[im 55/95  soft-tissue]
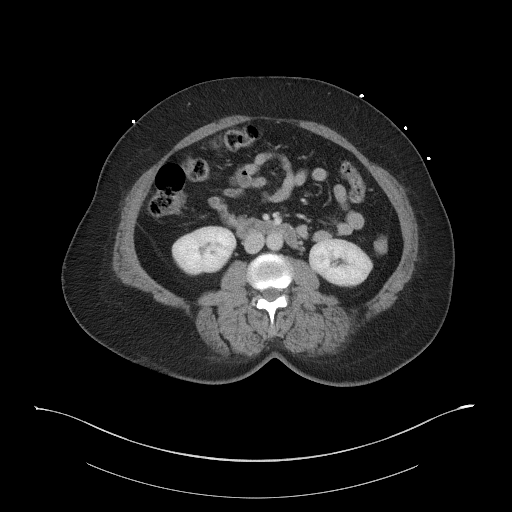
[im 60/95  soft-tissue]
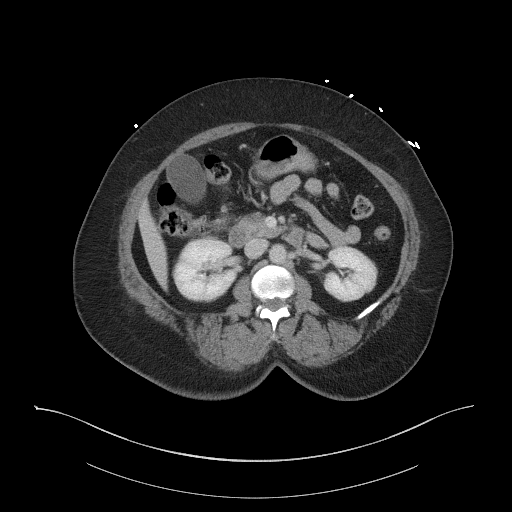
[im 60/95  bone]
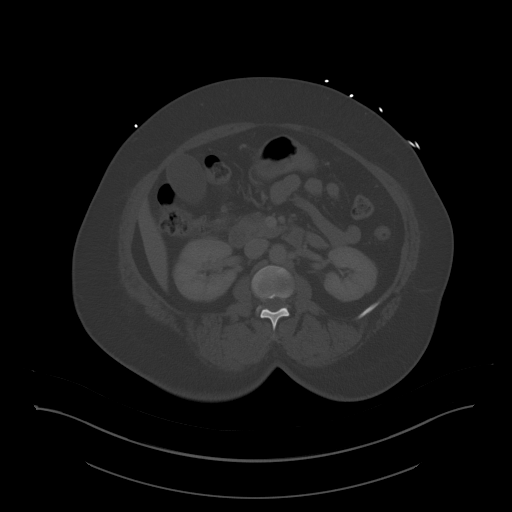
[im 70/95  soft-tissue]
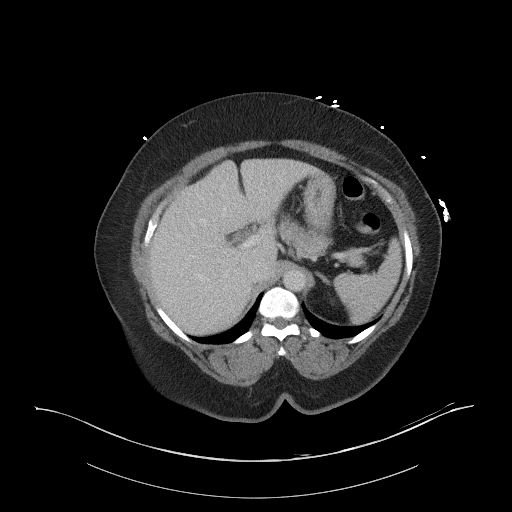
[im 75/95  soft-tissue]
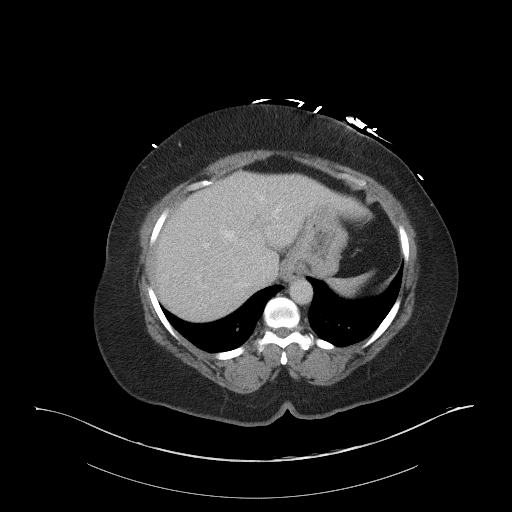
[im 80/95  soft-tissue]
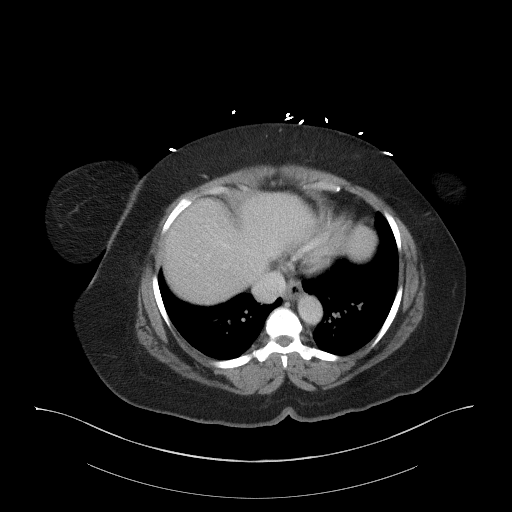
[im 90/95  soft-tissue]
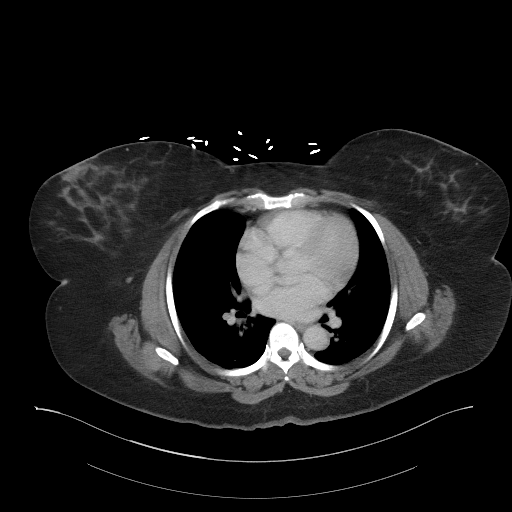

[Series 6: a/p w/ cor · coronal · 0.81mm/px · 3 of 163 slices shown]
[im 55/163  soft-tissue]
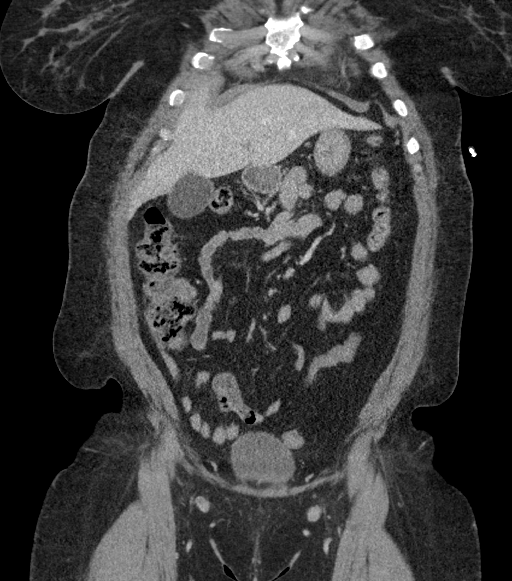
[im 73/163  soft-tissue]
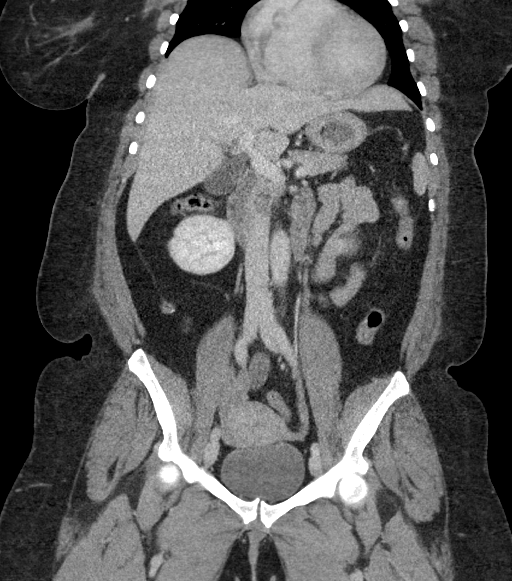
[im 91/163  soft-tissue]
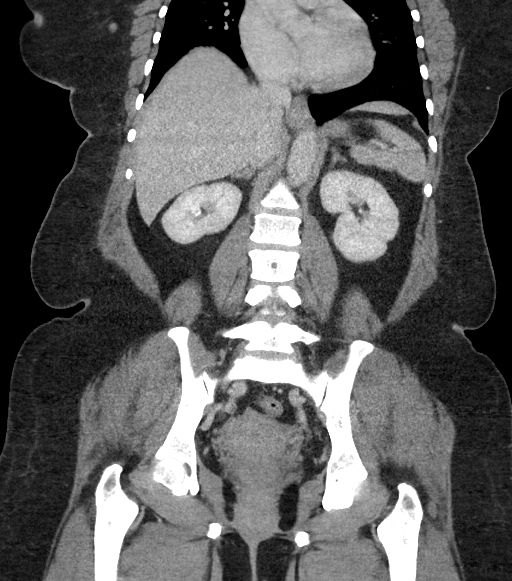

[16 of 46 positions shown; findings below may reference images not displayed]

FINDINGS: Lower chest: The lung bases are clear without focal nodule, mass, or
airspace disease. The heart size is normal. No significant pleural
or pericardial effusion is present.

Hepatobiliary: No pneumobilia or mass lesion is present. No focal
lesions are present to explain the hyperechoic areas identified on
the ultrasound. Common bile duct is within normal limits. Layering
gallstones are again noted. Minimal fluid is present in the
gallbladder fossa.

Pancreas: Unremarkable. No pancreatic ductal dilatation or
surrounding inflammatory changes.

Spleen: Normal in size without focal abnormality.

Adrenals/Urinary Tract: Adrenal glands are normal bilaterally.
Kidneys and ureters are within normal limits. The urinary bladder is
normal.

Stomach/Bowel: The stomach and duodenum are within normal limits.
The area noted anterior to the liver may represent a portion of the
stomach depending on the angle of insonation. No mass lesion is
present. Small bowel is unremarkable. Terminal ileum is within
normal limits. Appendix is surgically absent. The ascending and
transverse colon are within normal limits. Descending and sigmoid
colon are normal.

Vascular/Lymphatic: No significant vascular findings are present. No
enlarged abdominal or pelvic lymph nodes.

Reproductive: Uterus and bilateral adnexa are unremarkable.

Other: No abdominal wall hernia or abnormality. No abdominopelvic
ascites.

Musculoskeletal: Vertebral body heights and alignment are normal. No
focal lytic or blastic lesions are present. Pelvis is unremarkable.
Hips are located and within normal limits.
IMPRESSION: 1. Cholelithiasis with minimal fluid in the gallbladder fossa. This
could be related to early cholecystitis.
2. No pneumobilia.
3. No mass lesion anterior to the liver.
4. No other acute or focal abnormality to explain the patient's
symptoms.

## 2021-06-30 ENCOUNTER — Ambulatory Visit: Payer: Self-pay | Admitting: Nurse Practitioner

## 2021-09-25 ENCOUNTER — Ambulatory Visit (HOSPITAL_COMMUNITY)
Admission: EM | Admit: 2021-09-25 | Discharge: 2021-09-25 | Disposition: A | Discharge status: Home / Self Care | Payer: BC Managed Care – PPO | Attending: Emergency Medicine | Admitting: Emergency Medicine

## 2021-09-25 ENCOUNTER — Other Ambulatory Visit: Payer: Self-pay

## 2021-09-25 ENCOUNTER — Encounter (HOSPITAL_COMMUNITY): Payer: Self-pay | Admitting: Emergency Medicine

## 2021-09-25 DIAGNOSIS — Z20822 Contact with and (suspected) exposure to covid-19: Secondary | ICD-10-CM | POA: Diagnosis present

## 2021-09-25 DIAGNOSIS — J069 Acute upper respiratory infection, unspecified: Secondary | ICD-10-CM | POA: Insufficient documentation

## 2021-09-25 LAB — SARS CORONAVIRUS 2 (TAT 6-24 HRS): SARS Coronavirus 2: NEGATIVE

## 2021-09-25 LAB — POC INFLUENZA A AND B ANTIGEN (URGENT CARE ONLY)
INFLUENZA A ANTIGEN, POC: NEGATIVE
INFLUENZA B ANTIGEN, POC: NEGATIVE

## 2021-09-25 MED ORDER — FLUTICASONE PROPIONATE 50 MCG/ACT NA SUSP: 2.0000 | Freq: Every day | NASAL | 0 refills | Status: DC

## 2021-09-25 MED ORDER — BENZONATATE 200 MG PO CAPS
200.0000 mg | ORAL_CAPSULE | Freq: Three times a day (TID) | ORAL | 0 refills | Status: DC | PRN
Start: 2021-09-25 — End: 2022-01-07

## 2021-09-25 MED ORDER — IBUPROFEN 600 MG PO TABS: 600.0000 mg | ORAL_TABLET | Freq: Four times a day (QID) | ORAL | 0 refills | Status: DC | PRN

## 2021-09-25 NOTE — ED Triage Notes (Signed)
Pt c/o cough, congestion and body aches for 2 days.

## 2021-09-25 NOTE — Discharge Instructions (Addendum)
Flonase, saline nasal irrigation with a NeilMed sinus rinse and distilled water as often as you want, Mucinex, for the nasal congestion and cough.  Tessalon for the cough.  Take 600 mg of ibuprofen combined with 1000 mg of Tylenol 3-4 times a day as needed for body aches, headaches.  I will contact you only if your flu or COVID come back positive.  You can also call here and get your results.  I will prescribe Tamiflu if your flu is positive and Molnupiravir if your COVID is positive.  If you do not hear from me by the end of the day, you can assume that your influenza is negative.  Your COVID will be back tomorrow.

## 2021-09-25 NOTE — ED Provider Notes (Addendum)
HPI  SUBJECTIVE:  Margaret Singleton is a 50 y.o. female who presents with 2 days of body aches, headaches, cough, nasal congestion, rhinorrhea.  She just got back from a trip last week.  No fevers, postnasal drip, sore throat, loss of sense of smell or taste, shortness of breath, nausea, vomiting, diarrhea, abdominal pain.  No known COVID or flu exposure.  She got the third dose of the COVID-vaccine.  She has not yet gotten the flu vaccine.  She states that she is unable to sleep at night secondary to the cough.  No change in appetite.  No antipyretic in the past 6 hours.  She tried tea with improvement in her symptoms.  No aggravating factors.  She has a past medical history of hypertension and BMI above 30.  No history of pulmonary disease, smoking.  JQB:HALPF, Lolita Cram, FNP   Past Medical History:  Diagnosis Date   Endometrial polyp    History of diverticulitis of colon 02/2017   History of ectopic pregnancy    Hypertension    PMB (postmenopausal bleeding)    Thickened endometrium     Past Surgical History:  Procedure Laterality Date   CHOLECYSTECTOMY N/A 10/21/2020   Procedure: LAPAROSCOPIC CHOLECYSTECTOMY;  Surgeon: Andria Meuse, MD;  Location: [facility] OR;  Service: General;  Laterality: N/A;   DILATATION & CURETTAGE/HYSTEROSCOPY WITH MYOSURE N/A 06/04/2019   Procedure: DILATATION & CURETTAGE/HYSTEROSCOPY WITH MYOSURE;  Surgeon: Romualdo Bolk, MD;  Location: [facility] [place];  Service: Gynecology;  Laterality: N/A;  follow previous case   ECTOPIC PREGNANCY SURGERY  early 2000s  in [place]   LAPAROSCOPIC APPENDECTOMY N/A 02/20/2014   Procedure: APPENDECTOMY LAPAROSCOPIC;  Surgeon: Liz Malady, MD;  Location: [facility] OR;  Service: General;  Laterality: N/A;   LYSIS OF ADHESION N/A 10/21/2020   Procedure: LYSIS OF ADHESION;  Surgeon: Andria Meuse, MD;  Location: [facility] OR;  Service: General;  Laterality: N/A;    Family History  Problem Relation Age of Onset   Healthy  Mother    Healthy Father     Social History   Tobacco Use   Smoking status: Never   Smokeless tobacco: Never  Vaping Use   Vaping Use: Never used  Substance Use Topics   Alcohol use: No   Drug use: Never    No current facility-administered medications for this encounter.  Current Outpatient Medications:    benzonatate (TESSALON) 200 MG capsule, Take 1 capsule (200 mg total) by mouth 3 (three) times daily as needed for cough., Disp: 30 capsule, Rfl: 0   fluticasone (FLONASE) 50 MCG/ACT nasal spray, Place 2 sprays into both nostrils daily., Disp: 16 g, Rfl: 0   ibuprofen (ADVIL) 600 MG tablet, Take 1 tablet (600 mg total) by mouth every 6 (six) hours as needed., Disp: 30 tablet, Rfl: 0   hydrochlorothiazide (HYDRODIURIL) 12.5 MG tablet, Take 1 tablet by mouth daily as needed for leg swelling, Disp: 90 tablet, Rfl: 1  Allergies  Allergen Reactions   Oxycodone Itching     ROS  As noted in HPI.   Physical Exam  BP (!) 150/90 (BP Location: Left Arm)   Pulse 68   Temp 98.9 F (37.2 C) (Oral)   Resp 18   LMP 03/30/2019   SpO2 96%   Constitutional: Well developed, well nourished, no acute distress Eyes:  EOMI, conjunctiva normal bilaterally HENT: Normocephalic, atraumatic,mucus membranes moist.  Positive nasal congestion.  Erythematous, swollen turbinates and maxillary, frontal sinus tenderness.  Normal oropharynx. Respiratory:  Normal inspiratory effort, lungs clear bilaterally Cardiovascular: Normal rate, regular rhythm, no murmurs rubs or gallops GI: nondistended skin: No rash, skin intact Musculoskeletal: no deformities Neurologic: Alert & oriented x 3, no focal neuro deficits Psychiatric: Speech and behavior appropriate   ED Course   Medications - No data to display  Orders Placed This Encounter  Procedures   SARS CORONAVIRUS 2 (TAT 6-24 HRS) Nasopharyngeal Nasopharyngeal Swab    Standing Status:   Standing    Number of Occurrences:   1   POC Influenza A &  B Ag (Urgent Care)    Standing Status:   Standing    Number of Occurrences:   1    Results for orders placed or performed during the hospital encounter of 09/25/21 (from the past 24 hour(s))  SARS CORONAVIRUS 2 (TAT 6-24 HRS) Nasopharyngeal Nasopharyngeal Swab     Status: None   Collection Time: 09/25/21 11:52 AM   Specimen: Nasopharyngeal Swab  Result Value Ref Range   SARS Coronavirus 2 NEGATIVE NEGATIVE  POC Influenza A & B Ag (Urgent Care)     Status: None   Collection Time: 09/25/21 12:35 PM  Result Value Ref Range   INFLUENZA A ANTIGEN, POC NEGATIVE NEGATIVE   INFLUENZA B ANTIGEN, POC NEGATIVE NEGATIVE   No results found.  ED Clinical Impression  1. Upper respiratory infection with cough and congestion   2. Encounter for laboratory testing for COVID-19 virus      ED Assessment/Plan  Checking rapid flu.  Will prescribe Tamiflu if flu is positive.  Also COVID.  Patient qualifies for Molnupiravir based on history of hypertension and BMI above 30 if COVID is positive.  In the meantime, Flonase, saline nasal irrigation, Mucinex, Tessalon, Tylenol/ibuprofen as needed.  Work note for Friday and Monday.  She is not working the weekend.  Rapid flu negative.  COVID pending at the time of signing of this note.   COVID-negative.  Plan as above  Discussed labs,  MDM, treatment plan, and plan for follow-up with patient. patient agrees with plan.   Meds ordered this encounter  Medications   fluticasone (FLONASE) 50 MCG/ACT nasal spray    Sig: Place 2 sprays into both nostrils daily.    Dispense:  16 g    Refill:  0   ibuprofen (ADVIL) 600 MG tablet    Sig: Take 1 tablet (600 mg total) by mouth every 6 (six) hours as needed.    Dispense:  30 tablet    Refill:  0   benzonatate (TESSALON) 200 MG capsule    Sig: Take 1 capsule (200 mg total) by mouth 3 (three) times daily as needed for cough.    Dispense:  30 capsule    Refill:  0      *This clinic note was created using  Scientist, clinical (histocompatibility and immunogenetics). Therefore, there may be occasional mistakes despite careful proofreading.  ?    Domenick Gong, MD 09/25/21 1237    Domenick Gong, MD 09/26/21 [phone]

## 2022-01-07 ENCOUNTER — Encounter (HOSPITAL_COMMUNITY): Payer: Self-pay

## 2022-01-07 ENCOUNTER — Ambulatory Visit (HOSPITAL_COMMUNITY)
Admission: EM | Admit: 2022-01-07 | Discharge: 2022-01-07 | Disposition: A | Discharge status: Home / Self Care | Payer: BC Managed Care – PPO | Attending: Family Medicine | Admitting: Family Medicine

## 2022-01-07 DIAGNOSIS — B349 Viral infection, unspecified: Secondary | ICD-10-CM | POA: Diagnosis not present

## 2022-01-07 DIAGNOSIS — J069 Acute upper respiratory infection, unspecified: Secondary | ICD-10-CM | POA: Insufficient documentation

## 2022-01-07 DIAGNOSIS — Z20822 Contact with and (suspected) exposure to covid-19: Secondary | ICD-10-CM | POA: Diagnosis not present

## 2022-01-07 DIAGNOSIS — R509 Fever, unspecified: Secondary | ICD-10-CM | POA: Insufficient documentation

## 2022-01-07 DIAGNOSIS — R52 Pain, unspecified: Secondary | ICD-10-CM | POA: Diagnosis not present

## 2022-01-07 LAB — POC INFLUENZA A AND B ANTIGEN (URGENT CARE ONLY)
INFLUENZA A ANTIGEN, POC: NEGATIVE
INFLUENZA B ANTIGEN, POC: NEGATIVE

## 2022-01-07 MED ORDER — ACETAMINOPHEN 325 MG PO TABS
650.0000 mg | ORAL_TABLET | Freq: Once | ORAL | Status: AC
  Administered 2022-01-07: 650 mg via ORAL

## 2022-01-07 MED ORDER — ACETAMINOPHEN 325 MG PO TABS
ORAL_TABLET | ORAL | Status: AC
  Filled 2022-01-07: qty 2

## 2022-01-07 NOTE — ED Triage Notes (Signed)
Pt presents with headache, non productive cough, bilateral ear pain, fever, and generalized body aches X 4 days.

## 2022-01-07 NOTE — Discharge Instructions (Addendum)
You were seen today for fever, chills, body aches, and upper respiratory symptoms.  Your flu swab was negative today.  Therefor we have swabbed you for covid.  This test will be resulted tomorrow.  If this is positive we will call and let you know.   Your symptoms are likely viral related.   I do recommend going home and getting plenty of rest and fluids.  You may take tylenol or motrin for pain, fever, or body aches.   You may use over the counter zyrtec, dayquil, nyquil for your symptoms as well.   Please follow up here or with your primary care provider if you are not improving over the next several days.

## 2022-01-07 NOTE — ED Provider Notes (Signed)
[facility]    CSN: [id number] Arrival date & time: 01/07/22  1215      History   Chief Complaint Chief Complaint  Patient presents with   URI   Generalized Body Aches   Fever    HPI Margaret Singleton is a 51 y.o. female.   Patient is here with fever, chills, body aches.  Having headaches.   Going on x 4 days.   She has not checked her temp at home.   Thinks fever x 2 days; + runny nose, cough, sore throat, ear pain.   She has taken advil, dayquil for her symptoms.  Her grandson had a fever recently.   Past Medical History:  Diagnosis Date   Endometrial polyp    History of diverticulitis of colon 02/2017   History of ectopic pregnancy    Hypertension    PMB (postmenopausal bleeding)    Thickened endometrium     Patient Active Problem List   Diagnosis Date Noted   Abdominal pain 10/20/2020   Vitamin D deficiency 06/22/2019   Chronic nonintractable headache 03/12/2019   Edema 03/12/2019   Essential hypertension 02/12/2019   Swelling of both ankles 02/12/2019   Cholelithiasis 02/21/2014   Acute appendicitis 02/20/2014    Past Surgical History:  Procedure Laterality Date   CHOLECYSTECTOMY N/A 10/21/2020   Procedure: LAPAROSCOPIC CHOLECYSTECTOMY;  Surgeon: Ileana Roup, MD;  Location: [place];  Service: General;  Laterality: N/A;   [place] N/A 06/04/2019   Procedure: DILATATION & CURETTAGE/HYSTEROSCOPY WITH MYOSURE;  Surgeon: Salvadore Dom, MD;  Location: [place];  Service: Gynecology;  Laterality: N/A;  follow previous case   ECTOPIC PREGNANCY SURGERY  early 2000s  in [place] N/A 02/20/2014   Procedure: APPENDECTOMY LAPAROSCOPIC;  Surgeon: Zenovia Jarred, MD;  Location: [place];  Service: General;  Laterality: N/A;   LYSIS OF ADHESION N/A 10/21/2020   Procedure: LYSIS OF ADHESION;  Surgeon: Ileana Roup, MD;  Location: [place];  Service: General;   Laterality: N/A;    OB History     Gravida  5   Para  4   Term  0   Preterm  4   AB  1   Living  4      SAB  0   IAB  0   Ectopic  1   Multiple      Live Births  4            Home Medications    Prior to Admission medications   Medication Sig Start Date End Date Taking? Authorizing Provider  benzonatate (TESSALON) 200 MG capsule Take 1 capsule (200 mg total) by mouth 3 (three) times daily as needed for cough. 09/25/21   Melynda Ripple, MD  fluticasone (FLONASE) 50 MCG/ACT nasal spray Place 2 sprays into both nostrils daily. 09/25/21   Melynda Ripple, MD  hydrochlorothiazide (HYDRODIURIL) 12.5 MG tablet Take 1 tablet by mouth daily as needed for leg swelling 02/26/21   Minette Brine, FNP  ibuprofen (ADVIL) 600 MG tablet Take 1 tablet (600 mg total) by mouth every 6 (six) hours as needed. 09/25/21   Melynda Ripple, MD    Family History Family History  Problem Relation Age of Onset   Healthy Mother    Healthy Father     Social History Social History   Tobacco Use   Smoking status: Never   Smokeless tobacco: Never  Vaping Use   Vaping  Use: Never used  Substance Use Topics   Alcohol use: No   Drug use: Never     Allergies   Oxycodone   Review of Systems Review of Systems  Constitutional:  Positive for activity change, appetite change, chills and fever.  HENT:  Positive for congestion, ear pain, rhinorrhea and sore throat.   Eyes: Negative.   Respiratory:  Positive for cough. Negative for wheezing.   Cardiovascular: Negative.   Gastrointestinal: Negative.   Genitourinary: Negative.   Musculoskeletal:  Positive for arthralgias and myalgias.    Physical Exam Triage Vital Signs ED Triage Vitals  Enc Vitals Group     BP 01/07/22 1443 (!) 144/91     Pulse Rate 01/07/22 1443 97     Resp 01/07/22 1443 (!) 22     Temp 01/07/22 1443 100.3 F (37.9 C)     Temp Source 01/07/22 1443 Oral     SpO2 01/07/22 1443 98 %     Weight --       Height --      Head Circumference --      Peak Flow --      Pain Score 01/07/22 1446 7     Pain Loc --      Pain Edu? --      Excl. in [place]? --    No data found.  Updated Vital Signs BP (!) 144/91 (BP Location: Right Arm)    Pulse 97    Temp 100.3 F (37.9 C) (Oral)    Resp (!) 22    LMP 03/30/2019    SpO2 98%   Visual Acuity Right Eye Distance:   Left Eye Distance:   Bilateral Distance:    Right Eye Near:   Left Eye Near:    Bilateral Near:     Physical Exam Constitutional:      Appearance: She is ill-appearing.     Comments: Wrapped in a blanket   HENT:     Head: Normocephalic.     Right Ear: Tympanic membrane normal.     Left Ear: Tympanic membrane normal.     Nose: Congestion and rhinorrhea present.  Eyes:     Pupils: Pupils are equal, round, and reactive to light.  Cardiovascular:     Rate and Rhythm: Normal rate and regular rhythm.  Pulmonary:     Effort: Pulmonary effort is normal.     Breath sounds: Normal breath sounds.  Musculoskeletal:     Cervical back: Normal range of motion and neck supple. No tenderness.  Neurological:     Mental Status: She is alert.     UC Treatments / Results  Labs (all labs ordered are listed, but only abnormal results are displayed) Labs Reviewed  POC INFLUENZA A AND B ANTIGEN (URGENT CARE ONLY)    EKG   Radiology No results found.  Procedures Procedures (including critical care time)  Medications Ordered in UC Medications  acetaminophen (TYLENOL) tablet 650 mg (has no administration in time range)    Initial Impression / Assessment and Plan / UC Course  I have reviewed the triage vital signs and the nursing notes.  Pertinent labs & imaging results that were available during my care of the patient were reviewed by me and considered in my medical decision making (see chart for details).   Patient was seen today for uri symptoms.  Her flu swab was negative.  Swabbed for covid today and aware this will be  resulted tomorrow.  For now, supportive care only.  Plenty of rest and fluids.  Advised to follow up or return if not improving or worsening over time.   Final Clinical Impressions(s) / UC Diagnoses   Final diagnoses:  Fever, unspecified  Body aches  Upper respiratory tract infection, unspecified type  Viral syndrome     Discharge Instructions      You were seen today for fever, chills, body aches, and upper respiratory symptoms.  Your flu swab was negative today.  Therefor we have swabbed you for covid.  This test will be resulted tomorrow.  If this is positive we will call and let you know.   Your symptoms are likely viral related.   I do recommend going home and getting plenty of rest and fluids.  You may take tylenol or motrin for pain, fever, or body aches.   You may use over the counter zyrtec, dayquil, nyquil for your symptoms as well.   Please follow up here or with your primary care provider if you are not improving over the next several days.     ED Prescriptions   None    PDMP not reviewed this encounter.   Rondel Oh, MD 01/07/22 1520

## 2022-01-08 LAB — SARS CORONAVIRUS 2 (TAT 6-24 HRS): SARS Coronavirus 2: NEGATIVE

## 2022-02-25 ENCOUNTER — Ambulatory Visit: Payer: BC Managed Care – PPO | Admitting: Nurse Practitioner

## 2022-02-25 ENCOUNTER — Encounter: Payer: Self-pay | Admitting: Nurse Practitioner

## 2022-02-25 VITALS — BP 134/74 | HR 78 | Temp 98.2°F | Ht <= 58 in | Wt 192.0 lb

## 2022-02-25 DIAGNOSIS — Z1231 Encounter for screening mammogram for malignant neoplasm of breast: Secondary | ICD-10-CM

## 2022-02-25 DIAGNOSIS — M25471 Effusion, right ankle: Secondary | ICD-10-CM

## 2022-02-25 DIAGNOSIS — M25472 Effusion, left ankle: Secondary | ICD-10-CM

## 2022-02-25 DIAGNOSIS — R21 Rash and other nonspecific skin eruption: Secondary | ICD-10-CM | POA: Diagnosis not present

## 2022-02-25 DIAGNOSIS — E559 Vitamin D deficiency, unspecified: Secondary | ICD-10-CM

## 2022-02-25 DIAGNOSIS — Z6841 Body Mass Index (BMI) 40.0 and over, adult: Secondary | ICD-10-CM

## 2022-02-25 DIAGNOSIS — I10 Essential (primary) hypertension: Secondary | ICD-10-CM

## 2022-02-25 MED ORDER — HYDROCHLOROTHIAZIDE 12.5 MG PO TABS: ORAL_TABLET | ORAL | 1 refills | Status: DC

## 2022-02-25 MED ORDER — CLINDAMYCIN PHOSPHATE 1 % EX GEL: Freq: Two times a day (BID) | CUTANEOUS | 0 refills | Status: DC

## 2022-02-25 NOTE — Patient Instructions (Signed)

## 2022-02-25 NOTE — Progress Notes (Signed)
?Industrial/product designer as a Education administrator for [employer], FNP.,have documented all relevant documentation on the behalf of Minette Brine, FNP,as directed by  Minette Brine, FNP while in the presence of Minette Brine, [place]. ? ?This visit occurred during the SARS-CoV-2 public health emergency.  Safety protocols were in place, including screening questions prior to the visit, additional usage of staff PPE, and extensive cleaning of exam room while observing appropriate contact time as indicated for disinfecting solutions. ? ?Subjective:  ?  ? Patient ID: Margaret Singleton , female    DOB: 1971/02/18 , 51 y.o.   MRN: [id number] ? ? ?Chief Complaint  ?Patient presents with  ? Hypertension  ? ? ?HPI ? ?Patient presents today for a blood pressure f/u. She has complaints of rash on her face. She also complains of ankle pain.  ? ?Hypertension ?This is a chronic problem. The current episode started more than 1 year ago. The problem has been gradually improving since onset. The problem is controlled. Pertinent negatives include no anxiety, chest pain, headaches or palpitations. There are no associated agents to hypertension. Risk factors for coronary artery disease include obesity and sedentary lifestyle (Not exercising due to weather). Past treatments include nothing. Compliance problems include exercise (reports she walks alot at work).  There is no history of angina. There is no history of chronic renal disease.  ?Rash ?This is a new problem. The current episode started more than 1 month ago. The affected locations include the face. The rash is characterized by itchiness (reports will have purulent drainage from the area and will spread). Pertinent negatives include no cough. There is no history of asthma or eczema.   ? ?Past Medical History:  ?Diagnosis Date  ? Endometrial polyp   ? History of diverticulitis of colon 02/2017  ? History of ectopic pregnancy   ? Hypertension   ? PMB (postmenopausal bleeding)   ? Thickened endometrium    ?  ? ?Family History  ?Problem Relation Age of Onset  ? Healthy Mother   ? Healthy Father   ? ? ? ?Current Outpatient Medications:  ?  clindamycin (CLINDAGEL) 1 % gel, Apply topically 2 (two) times daily., Disp: 30 g, Rfl: 0 ?  hydrochlorothiazide (HYDRODIURIL) 12.5 MG tablet, Take 1 tablet by mouth daily as needed for leg swelling, Disp: 90 tablet, Rfl: 1  ? ?Allergies  ?Allergen Reactions  ? Oxycodone Itching  ?  ? ?Review of Systems  ?Constitutional: Negative.   ?HENT: Negative.    ?Eyes: Negative.   ?Respiratory: Negative.  Negative for cough.   ?Cardiovascular: Negative.  Negative for chest pain and palpitations.  ?Gastrointestinal: Negative.   ?Endocrine: Negative.   ?Genitourinary: Negative.   ?Musculoskeletal:  Positive for arthralgias.  ?Skin:  Positive for rash.  ?Allergic/Immunologic: Negative.   ?Neurological: Negative.  Negative for headaches.  ?Hematological: Negative.   ?Psychiatric/Behavioral: Negative.     ? ?Today's Vitals  ? 02/25/22 1510  ?BP: 134/74  ?Pulse: 78  ?Temp: 98.2 ?F (36.8 ?C)  ?TempSrc: Oral  ?Weight: 192 lb (87.1 kg)  ?Height: 4' 9.2" (1.453 m)  ? ?Body mass index is 41.26 kg/m?.  ? ?Objective:  ?Physical Exam ?Vitals reviewed.  ?Constitutional:   ?   General: She is not in acute distress. ?   Appearance: Normal appearance. She is well-developed. She is obese.  ?HENT:  ?   Head: Normocephalic and atraumatic.  ?   Right Ear: Hearing normal.  ?   Left Ear: Hearing normal.  ?Eyes:  ?  General: Lids are normal.  ?   Extraocular Movements: Extraocular movements intact.  ?   Conjunctiva/sclera: Conjunctivae normal.  ?   Pupils: Pupils are equal, round, and reactive to light.  ?   Funduscopic exam: ?   Right eye: No papilledema.     ?   Left eye: No papilledema.  ?Neck:  ?   Thyroid: No thyroid mass.  ?   Vascular: No carotid bruit.  ?Cardiovascular:  ?   Rate and Rhythm: Normal rate and regular rhythm.  ?   Pulses: Normal pulses.  ?   Heart sounds: Normal heart sounds. No murmur  heard. ?Pulmonary:  ?   Effort: Pulmonary effort is normal. No respiratory distress.  ?   Breath sounds: Normal breath sounds. No wheezing.  ?Musculoskeletal:     ?   General: No swelling (puffy ankles).  ?   Cervical back: Full passive range of motion without pain and neck supple.  ?   Right lower leg: No edema.  ?   Left lower leg: No edema.  ?Skin: ?   General: Skin is warm and dry.  ?   Capillary Refill: Capillary refill takes less than 2 seconds.  ?Neurological:  ?   General: No focal deficit present.  ?   Mental Status: She is alert and oriented to person, place, and time.  ?   Cranial Nerves: No cranial nerve deficit.  ?   Sensory: No sensory deficit.  ?Psychiatric:     ?   Mood and Affect: Mood normal.     ?   Behavior: Behavior normal.     ?   Thought Content: Thought content normal.     ?   Judgment: Judgment normal.  ?  ? ?   ?Assessment And Plan:  ?   ?1. Essential hypertension ?Comments: Blood pressure is fairly controlled, she had stopped taking her HCTZ, will restart ?- hydrochlorothiazide (HYDRODIURIL) 12.5 MG tablet; Take 1 tablet by mouth daily as needed for leg swelling  Dispense: 90 tablet; Refill: 1 ?- CMP14+EGFR ? ?2. Rash and nonspecific skin eruption ?Comments: She has hyperpigmented skin to her face, will check autoimmune panel ?- Autoimmune Profile ? ?3. Vitamin D deficiency ?Will check vitamin D level and supplement as needed.    ?Also encouraged to spend 15 minutes in the sun daily.  ?- VITAMIN D 25 Hydroxy (Vit-D Deficiency, Fractures) ? ?4. Swelling of both ankles ?Comments: Bilateral lower extremities have trace edema, will restart HCTZ ? ?5. Encounter for screening mammogram for breast cancer ?Pt instructed on Self Breast Exam.According to ACOG guidelines Women aged 68 and older are recommended to get an annual mammogram. Form completed and given to patient contact the [facility] for appointment scheduing.  ?Pt encouraged to get annual mammogram ?- MM Digital Screening;  Future ? ?6. Class 3 severe obesity due to excess calories with serious comorbidity and body mass index (BMI) of 40.0 to 44.9 in adult [facility]) ?Chronic ?Discussed healthy diet and regular exercise options  ?Encouraged to exercise at least 150 minutes per week with 2 days of strength training ?Will refer to nutritionist, she also has hypertension ?- Referral to Nutrition and Diabetes Services ?  ? ? ?Patient was given opportunity to ask questions. Patient verbalized understanding of the plan and was able to repeat key elements of the plan. All questions were answered to their satisfaction.  ?Minette Brine, FNP  ? ?I, Minette Brine, FNP, have reviewed all documentation for this visit. The documentation  on 02/25/22 for the exam, diagnosis, procedures, and orders are all accurate and complete.  ? ?IF YOU HAVE BEEN REFERRED TO A SPECIALIST, IT MAY TAKE 1-2 WEEKS TO SCHEDULE/PROCESS THE REFERRAL. IF YOU HAVE NOT HEARD FROM US/SPECIALIST IN TWO WEEKS, PLEASE GIVE [place] A CALL AT [phone].  ? ?THE PATIENT IS ENCOURAGED TO PRACTICE SOCIAL DISTANCING DUE TO THE COVID-19 PANDEMIC.   ?

## 2022-02-26 LAB — CMP14+EGFR
ALT: 13 IU/L (ref 0–32)
AST: 17 IU/L (ref 0–40)
Albumin/Globulin Ratio: 1.1 — ABNORMAL LOW (ref 1.2–2.2)
Albumin: 3.9 g/dL (ref 3.8–4.9)
Alkaline Phosphatase: 119 IU/L (ref 44–121)
BUN/Creatinine Ratio: 14 (ref 9–23)
BUN: 13 mg/dL (ref 6–24)
Bilirubin Total: 0.5 mg/dL (ref 0.0–1.2)
CO2: 28 mmol/L (ref 20–29)
Calcium: 9.6 mg/dL (ref 8.7–10.2)
Chloride: 103 mmol/L (ref 96–106)
Creatinine, Ser: 0.92 mg/dL (ref 0.57–1.00)
Globulin, Total: 3.6 g/dL (ref 1.5–4.5)
Glucose: 90 mg/dL (ref 70–99)
Potassium: 3.9 mmol/L (ref 3.5–5.2)
Sodium: 143 mmol/L (ref 134–144)
Total Protein: 7.5 g/dL (ref 6.0–8.5)
eGFR: 75 mL/min/{1.73_m2} (ref >59)

## 2022-02-26 LAB — VITAMIN D 25 HYDROXY (VIT D DEFICIENCY, FRACTURES): Vit D, 25-Hydroxy: 13.4 ng/mL — ABNORMAL LOW (ref 30.0–100.0)

## 2022-02-26 LAB — AUTOIMMUNE PROFILE
Anti Nuclear Antibody (ANA): NEGATIVE
Complement C3, Serum: 148 mg/dL (ref 82–167)
dsDNA Ab: <1 IU/mL (ref 0–9)

## 2022-03-08 MED ORDER — VITAMIN D (ERGOCALCIFEROL) 1.25 MG (50000 UNIT) PO CAPS: 50000.0000 [IU] | ORAL_CAPSULE | ORAL | 1 refills | Status: DC

## 2022-08-03 ENCOUNTER — Encounter: Payer: BC Managed Care – PPO | Admitting: Nurse Practitioner

## 2023-02-16 ENCOUNTER — Encounter: Payer: Self-pay | Admitting: Nurse Practitioner

## 2023-02-16 ENCOUNTER — Ambulatory Visit: Payer: BC Managed Care – PPO | Admitting: Nurse Practitioner

## 2023-02-16 DIAGNOSIS — M25562 Pain in left knee: Secondary | ICD-10-CM

## 2023-02-16 DIAGNOSIS — I10 Essential (primary) hypertension: Secondary | ICD-10-CM

## 2023-02-16 DIAGNOSIS — Z13228 Encounter for screening for other metabolic disorders: Secondary | ICD-10-CM

## 2023-02-16 DIAGNOSIS — E559 Vitamin D deficiency, unspecified: Secondary | ICD-10-CM | POA: Diagnosis not present

## 2023-02-16 DIAGNOSIS — H9312 Tinnitus, left ear: Secondary | ICD-10-CM | POA: Diagnosis not present

## 2023-02-16 DIAGNOSIS — Z6841 Body Mass Index (BMI) 40.0 and over, adult: Secondary | ICD-10-CM

## 2023-02-16 DIAGNOSIS — Z1231 Encounter for screening mammogram for malignant neoplasm of breast: Secondary | ICD-10-CM

## 2023-02-16 MED ORDER — HYDROCHLOROTHIAZIDE 12.5 MG PO TABS: ORAL_TABLET | ORAL | 1 refills | Status: DC

## 2023-02-16 MED ORDER — VITAMIN D (ERGOCALCIFEROL) 1.25 MG (50000 UNIT) PO CAPS: 50000.0000 [IU] | ORAL_CAPSULE | ORAL | 1 refills | Status: DC

## 2023-02-16 MED ORDER — MELOXICAM 15 MG PO TABS: 15.0000 mg | ORAL_TABLET | Freq: Every day | ORAL | 0 refills | Status: DC

## 2023-02-16 NOTE — Progress Notes (Signed)
I,Sheena H Holbrook,acting as a Education administrator for Minette Brine, FNP.,have documented all relevant documentation on the behalf of Minette Brine, FNP,as directed by  Minette Brine, FNP while in the presence of Minette Brine, [place].    Subjective:     Patient ID: Margaret Singleton , female    DOB: 09-26-1971 , 52 y.o.   MRN: [id number]   Chief Complaint  Patient presents with   Knee Pain    Left, no injury    HPI  Patient presents today for left knee pain. She denies any recent injury. She is not sure if it has been swollen. She has pain with bending/flexing and walking. Having pain since last week. Denies fall or injury. She has not taken any pain medications.   Patient states she has not been here in a long time so she has not been taking any of her medications.       Past Medical History:  Diagnosis Date   Endometrial polyp    History of diverticulitis of colon 02/2017   History of ectopic pregnancy    Hypertension    PMB (postmenopausal bleeding)    Thickened endometrium      Family History  Problem Relation Age of Onset   Healthy Mother    Healthy Father      Current Outpatient Medications:    meloxicam (MOBIC) 15 MG tablet, Take 1 tablet (15 mg total) by mouth daily. Take 1 tablet by mouth daily x 5 days then take as needed daily, Disp: 30 tablet, Rfl: 0   clindamycin (CLINDAGEL) 1 % gel, Apply topically 2 (two) times daily. (Patient not taking: Reported on 02/16/2023), Disp: 30 g, Rfl: 0   hydrochlorothiazide (HYDRODIURIL) 12.5 MG tablet, Take 1 tablet by mouth daily as needed for leg swelling, Disp: 90 tablet, Rfl: 1   [START ON 02/17/2023] Vitamin D, Ergocalciferol, (DRISDOL) 1.25 MG (50000 UNIT) CAPS capsule, Take 1 capsule (50,000 Units total) by mouth 2 (two) times a week., Disp: 24 capsule, Rfl: 1   Allergies  Allergen Reactions   Oxycodone Itching     Review of Systems  Constitutional: Negative.   HENT: Negative.    Eyes: Negative.   Respiratory: Negative.   Negative for cough.   Cardiovascular: Negative.  Negative for chest pain and palpitations.  Gastrointestinal: Negative.   Endocrine: Negative.   Genitourinary: Negative.   Musculoskeletal:  Negative for arthralgias.       Left knee pain  Skin:  Negative for rash.  Allergic/Immunologic: Negative.   Neurological: Negative.  Negative for headaches.  Hematological: Negative.   Psychiatric/Behavioral: Negative.    All other systems reviewed and are negative.    Today's Vitals   02/16/23 1007  BP: (!) 138/92  Pulse: 61  Temp: 97.8 F (36.6 C)  TempSrc: Oral  SpO2: 99%  Weight: 187 lb (84.8 kg)  Height: 4\' 9"  (1.448 m)   Body mass index is 40.47 kg/m.   Objective:  Physical Exam Vitals reviewed.  Constitutional:      General: She is not in acute distress.    Appearance: Normal appearance. She is well-developed. She is obese.  HENT:     Head: Normocephalic and atraumatic.     Right Ear: Hearing normal.     Left Ear: Hearing normal.  Eyes:     General: Lids are normal.     Extraocular Movements: Extraocular movements intact.     Conjunctiva/sclera: Conjunctivae normal.     Pupils: Pupils are equal, round, and reactive to light.  Funduscopic exam:    Right eye: No papilledema.        Left eye: No papilledema.  Neck:     Thyroid: No thyroid mass.     Vascular: No carotid bruit.  Cardiovascular:     Rate and Rhythm: Normal rate and regular rhythm.     Pulses: Normal pulses.     Heart sounds: Normal heart sounds. No murmur heard. Pulmonary:     Effort: Pulmonary effort is normal. No respiratory distress.     Breath sounds: Normal breath sounds. No wheezing.  Musculoskeletal:        General: No swelling.     Cervical back: Full passive range of motion without pain and neck supple.  Skin:    General: Skin is warm and dry.     Capillary Refill: Capillary refill takes less than 2 seconds.  Neurological:     General: No focal deficit present.     Mental Status: She  is alert and oriented to person, place, and time.     Cranial Nerves: No cranial nerve deficit.     Sensory: No sensory deficit.  Psychiatric:        Mood and Affect: Mood normal.        Behavior: Behavior normal.        Thought Content: Thought content normal.        Judgment: Judgment normal.         Assessment And Plan:     1. Essential hypertension Comments: Blood pressure is fairly controlled, she had stopped taking her HCTZ, will restart - BMP8+eGFR - hydrochlorothiazide (HYDRODIURIL) 12.5 MG tablet; Take 1 tablet by mouth daily as needed for leg swelling  Dispense: 90 tablet; Refill: 1  2. Vitamin D deficiency Comments: She has not been taking regularly, will restart and recheck levels. - VITAMIN D 25 Hydroxy (Vit-D Deficiency, Fractures) - Vitamin D, Ergocalciferol, (DRISDOL) 1.25 MG (50000 UNIT) CAPS capsule; Take 1 capsule (50,000 Units total) by mouth 2 (two) times a week.  Dispense: 24 capsule; Refill: 1  3. Acute pain of left knee Comments: Mild crepitus to knee, negative drawer test. Inflammation vs arthritis. - meloxicam (MOBIC) 15 MG tablet; Take 1 tablet (15 mg total) by mouth daily. Take 1 tablet by mouth daily x 5 days then take as needed daily  Dispense: 30 tablet; Refill: 0  4. Tinnitus of left ear Comments: No abnormal findings on physical exam. She has not been taking her blood pressure medications, will restart and see if this improves. - TSH  5. Morbid obesity ([place]) She is encouraged to strive for BMI less than 30 to decrease cardiac risk. Advised to aim for at least 150 minutes of exercise per week.  6. BMI 40.0-44.9, adult ([place])  7. Encounter for screening mammogram for breast cancer Pt instructed on Self Breast Exam.According to ACOG guidelines Women aged 13 and older are recommended to get an annual mammogram. Form completed and given to patient contact the [facility] for appointment scheduing.  Will try to get her scheduled for the Mobile  Mammogram on 4/29 - MM Digital Screening; Future  8. Encounter for screening for metabolic disorder - Hemoglobin A1c    Patient was given opportunity to ask questions. Patient verbalized understanding of the plan and was able to repeat key elements of the plan. All questions were answered to their satisfaction.  Minette Brine, FNP   I, Minette Brine, FNP, have reviewed all documentation for this visit. The documentation on 02/16/23  for the exam, diagnosis, procedures, and orders are all accurate and complete.   IF YOU HAVE BEEN REFERRED TO A SPECIALIST, IT MAY TAKE 1-2 WEEKS TO SCHEDULE/PROCESS THE REFERRAL. IF YOU HAVE NOT HEARD FROM US/SPECIALIST IN TWO WEEKS, PLEASE GIVE [place] A CALL AT [phone].   THE PATIENT IS ENCOURAGED TO PRACTICE SOCIAL DISTANCING DUE TO THE COVID-19 PANDEMIC.

## 2023-02-17 LAB — BMP8+EGFR
BUN/Creatinine Ratio: 13 (ref 9–23)
BUN: 11 mg/dL (ref 6–24)
CO2: 26 mmol/L (ref 20–29)
Calcium: 9.5 mg/dL (ref 8.7–10.2)
Chloride: 104 mmol/L (ref 96–106)
Creatinine, Ser: 0.84 mg/dL (ref 0.57–1.00)
Glucose: 78 mg/dL (ref 70–99)
Potassium: 4.4 mmol/L (ref 3.5–5.2)
Sodium: 142 mmol/L (ref 134–144)
eGFR: 83 mL/min/{1.73_m2} (ref >59)

## 2023-02-17 LAB — TSH: TSH: 1.56 u[IU]/mL (ref 0.450–4.500)

## 2023-02-17 LAB — HEMOGLOBIN A1C
Est. average glucose Bld gHb Est-mCnc: 114 mg/dL
Hgb A1c MFr Bld: 5.6 % (ref 4.8–5.6)

## 2023-02-17 LAB — VITAMIN D 25 HYDROXY (VIT D DEFICIENCY, FRACTURES): Vit D, 25-Hydroxy: 16.5 ng/mL — ABNORMAL LOW (ref 30.0–100.0)

## 2023-03-28 ENCOUNTER — Inpatient Hospital Stay: Admission: RE | Admit: 2023-03-28 | Payer: BC Managed Care – PPO | Source: Ambulatory Visit

## 2023-03-29 ENCOUNTER — Other Ambulatory Visit: Payer: Self-pay

## 2023-03-29 DIAGNOSIS — M25562 Pain in left knee: Secondary | ICD-10-CM

## 2023-04-04 ENCOUNTER — Ambulatory Visit: Payer: BC Managed Care – PPO | Admitting: Physician Assistant

## 2023-04-04 ENCOUNTER — Encounter: Payer: Self-pay | Admitting: Physician Assistant

## 2023-04-04 ENCOUNTER — Other Ambulatory Visit (INDEPENDENT_AMBULATORY_CARE_PROVIDER_SITE_OTHER): Payer: BC Managed Care – PPO

## 2023-04-04 DIAGNOSIS — M25562 Pain in left knee: Secondary | ICD-10-CM | POA: Diagnosis not present

## 2023-04-04 MED ORDER — LIDOCAINE HCL 1 % IJ SOLN
2.0000 mL | INTRAMUSCULAR | Status: AC | PRN
  Administered 2023-04-04: 2 mL

## 2023-04-04 MED ORDER — METHYLPREDNISOLONE ACETATE 40 MG/ML IJ SUSP
80.0000 mg | INTRAMUSCULAR | Status: AC | PRN
  Administered 2023-04-04: 80 mg via INTRA_ARTICULAR

## 2023-04-04 MED ORDER — BUPIVACAINE HCL 0.25 % IJ SOLN
2.0000 mL | INTRAMUSCULAR | Status: AC | PRN
  Administered 2023-04-04: 2 mL via INTRA_ARTICULAR

## 2023-04-04 NOTE — Progress Notes (Signed)
Office Visit Note   Patient: Margaret Singleton           Date of Birth: [birth year]           MRN: [id number] Visit Date: 04/04/2023              Requested by: Arnette Felts, FNP [address] [place],  [place] [address] PCP: Arnette Felts, FNP   Assessment & Plan: Visit Diagnoses:  1. Acute pain of left knee     Plan: [name] is a pleasant 52 year old woman who presents today with a history of left knee pain.  She denies any injuries.  But she has had this in the past.  X-rays did not show significant degenerative changes she does have some varus malalignment.  Her exam is fairly benign though she does have quite a bit of hyperextension of her left knee.  Her stability is good.  She does not have any sign of a recent injury.  Will try an injection today.  Follow-up if she does not improve  Follow-Up Instructions: No follow-ups on file.   Orders:  No orders of the defined types were placed in this encounter.  No orders of the defined types were placed in this encounter.     Procedures: Large Joint Inj on 04/04/2023 11:00 AM Indications: pain and diagnostic evaluation Details: 25 G 1.5 in needle, anteromedial approach  Arthrogram: No  Medications: 80 mg methylPREDNISolone acetate 40 MG/ML; 2 mL lidocaine 1 %; 2 mL bupivacaine 0.25 % Outcome: tolerated well, no immediate complications Procedure, treatment alternatives, risks and benefits explained, specific risks discussed. Consent was given by the patient.     Clinical Data: No additional findings.   Subjective: Chief Complaint  Patient presents with  . Left Knee - Pain    HPI Margaret Singleton is a 52 year old woman who presents today with left knee pain.  Denies any injury.  She has had this in the past states she has had a long time mostly in the front of her knee she has had injections in the past that have helped.  She does take meloxicam  Review of Systems  All other systems reviewed and are  negative.    Objective: Vital Signs: LMP 03/30/2019   Physical Exam Constitutional:      Appearance: Normal appearance.  Pulmonary:     Effort: Pulmonary effort is normal.  Skin:    General: Skin is warm.  Neurological:     General: No focal deficit present.     Mental Status: She is alert.  Psychiatric:        Mood and Affect: Mood normal.    Ortho Exam Examination of her left knee she has just global pain throughout the knee both medial laterally patellofemoral and the back of the knee.  She has no effusion or redness.  She has good strength distally and is neurovascularly intact.  She does have quite a bit of hyperextension in the left knee. Specialty Comments:  No specialty comments available.  Imaging: No results found.   PMFS History: Patient Active Problem List   Diagnosis Date Noted  . Pain in left knee 04/04/2023  . Morbid obesity (HCC) 02/15/2023  . Abdominal pain 10/20/2020  . Vitamin D deficiency 06/22/2019  . Chronic nonintractable headache 03/12/2019  . Edema 03/12/2019  . Essential hypertension 02/12/2019  . Swelling of both ankles 02/12/2019  . Cholelithiasis 02/21/2014  . Acute appendicitis 02/20/2014   Past Medical History:  Diagnosis Date  .  Endometrial polyp   . History of diverticulitis of colon 02/2017  . History of ectopic pregnancy   . Hypertension   . PMB (postmenopausal bleeding)   . Thickened endometrium     Family History  Problem Relation Age of Onset  . Healthy Mother   . Healthy Father     Past Surgical History:  Procedure Laterality Date  . CHOLECYSTECTOMY N/A 10/21/2020   Procedure: LAPAROSCOPIC CHOLECYSTECTOMY;  Surgeon: Andria Meuse, MD;  Location: [facility] OR;  Service: General;  Laterality: N/A;  . DILATATION & CURETTAGE/HYSTEROSCOPY WITH MYOSURE N/A 06/04/2019   Procedure: DILATATION & CURETTAGE/HYSTEROSCOPY WITH MYOSURE;  Surgeon: Romualdo Bolk, MD;  Location: [facility];  Service: Gynecology;   Laterality: N/A;  follow previous case  . ECTOPIC PREGNANCY SURGERY  early 2000s  in [place]  . LAPAROSCOPIC APPENDECTOMY N/A 02/20/2014   Procedure: APPENDECTOMY LAPAROSCOPIC;  Surgeon: Liz Malady, MD;  Location: [facility] OR;  Service: General;  Laterality: N/A;  . LYSIS OF ADHESION N/A 10/21/2020   Procedure: LYSIS OF ADHESION;  Surgeon: Andria Meuse, MD;  Location: [facility] OR;  Service: General;  Laterality: N/A;   Social History   Occupational History  . Not on file  Tobacco Use  . Smoking status: Never  . Smokeless tobacco: Never  Vaping Use  . Vaping Use: Never used  Substance and Sexual Activity  . Alcohol use: No  . Drug use: Never  . Sexual activity: Not Currently    Birth control/protection: None

## 2023-05-03 ENCOUNTER — Telehealth: Payer: Self-pay

## 2023-05-03 NOTE — Telephone Encounter (Signed)
Patient called and left message stating when she walked into work she was having sharp chest pains. Patient was called back and told she should go to the hospital since she is having chest pain. Patient states "okay" .

## 2023-06-20 ENCOUNTER — Other Ambulatory Visit (HOSPITAL_COMMUNITY)
Admission: RE | Admit: 2023-06-20 | Discharge: 2023-06-20 | Disposition: A | Discharge status: Home / Self Care | Payer: BC Managed Care – PPO | Source: Ambulatory Visit | Attending: Nurse Practitioner | Admitting: Nurse Practitioner

## 2023-06-20 ENCOUNTER — Ambulatory Visit (INDEPENDENT_AMBULATORY_CARE_PROVIDER_SITE_OTHER): Payer: BC Managed Care – PPO | Admitting: Nurse Practitioner

## 2023-06-20 ENCOUNTER — Encounter: Payer: Self-pay | Admitting: Nurse Practitioner

## 2023-06-20 VITALS — BP 120/80 | HR 87 | Temp 98.2°F | Ht <= 58 in | Wt 193.2 lb

## 2023-06-20 DIAGNOSIS — Z0000 Encounter for general adult medical examination without abnormal findings: Secondary | ICD-10-CM

## 2023-06-20 DIAGNOSIS — H5713 Ocular pain, bilateral: Secondary | ICD-10-CM

## 2023-06-20 DIAGNOSIS — Z23 Encounter for immunization: Secondary | ICD-10-CM

## 2023-06-20 DIAGNOSIS — Z113 Encounter for screening for infections with a predominantly sexual mode of transmission: Secondary | ICD-10-CM

## 2023-06-20 DIAGNOSIS — Z6841 Body Mass Index (BMI) 40.0 and over, adult: Secondary | ICD-10-CM

## 2023-06-20 DIAGNOSIS — Z79899 Other long term (current) drug therapy: Secondary | ICD-10-CM

## 2023-06-20 DIAGNOSIS — Z124 Encounter for screening for malignant neoplasm of cervix: Secondary | ICD-10-CM

## 2023-06-20 DIAGNOSIS — Z0120 Encounter for dental examination and cleaning without abnormal findings: Secondary | ICD-10-CM

## 2023-06-20 DIAGNOSIS — H6121 Impacted cerumen, right ear: Secondary | ICD-10-CM | POA: Diagnosis not present

## 2023-06-20 DIAGNOSIS — I10 Essential (primary) hypertension: Secondary | ICD-10-CM | POA: Diagnosis not present

## 2023-06-20 DIAGNOSIS — E559 Vitamin D deficiency, unspecified: Secondary | ICD-10-CM

## 2023-06-20 LAB — POCT URINALYSIS DIP (CLINITEK)
Bilirubin, UA: NEGATIVE
Blood, UA: NEGATIVE
Glucose, UA: NEGATIVE mg/dL
Ketones, POC UA: NEGATIVE mg/dL
Leukocytes, UA: NEGATIVE
Nitrite, UA: NEGATIVE
POC PROTEIN,UA: NEGATIVE
Spec Grav, UA: >=1.03 — AB (ref 1.010–1.025)
Urobilinogen, UA: 1 E.U./dL
pH, UA: 7 (ref 5.0–8.0)

## 2023-06-20 NOTE — Progress Notes (Addendum)
Madelaine Bhat, CMA,acting as a Neurosurgeon for Arnette Felts, FNP.,have documented all relevant documentation on the behalf of Arnette Felts, FNP,as directed by  Arnette Felts, FNP while in the presence of Arnette Felts, FNP.  Subjective:    Patient ID: Margaret Singleton , female    DOB: May 02, 1971 , 52 y.o.   MRN: [id number]  Chief Complaint  Patient presents with   Annual Exam    HPI  Patient presents today for a HM, patient reports compliance with medications. Patient denies any Chest pain, SOB, or headaches. Patient reports her body hurts everywhere sometimes related to the work she is doing on a regular basis.  She also reports she has headaches slight.   BP Readings from Last 3 Encounters: 06/20/23 : (!) 150/80 02/16/23 : 136/70 02/25/22 : 134/74       Past Medical History:  Diagnosis Date   Acute appendicitis 02/20/2014   Cholelithiasis 02/21/2014   Endometrial polyp    History of diverticulitis of colon 02/2017   History of ectopic pregnancy    Hypertension    PMB (postmenopausal bleeding)    Swelling of both ankles 02/12/2019   Thickened endometrium      Family History  Problem Relation Age of Onset   Healthy Mother    Healthy Father    Breast cancer Neg Hx      Current Outpatient Medications:    clindamycin (CLINDAGEL) 1 % gel, Apply topically 2 (two) times daily., Disp: 30 g, Rfl: 0   hydrochlorothiazide (HYDRODIURIL) 12.5 MG tablet, Take 1 tablet by mouth daily as needed for leg swelling, Disp: 90 tablet, Rfl: 1   Vitamin D, Ergocalciferol, (DRISDOL) 1.25 MG (50000 UNIT) CAPS capsule, Take 1 capsule (50,000 Units total) by mouth 2 (two) times a week., Disp: 24 capsule, Rfl: 1   meloxicam (MOBIC) 15 MG tablet, TAKE 1 TABLET(15 MG) BY MOUTH DAILY FOR 5 DAYS THEN AS NEEDED, Disp: 30 tablet, Rfl: 0   Allergies  Allergen Reactions   Oxycodone Itching      The patient states she is post menopausal status. Patient's last menstrual period was 03/30/2019.  Negative  for Dysmenorrhea and Negative for Menorrhagia. Negative for: breast discharge, breast lump(s), breast pain and breast self exam. Associated symptoms include abnormal vaginal bleeding. Pertinent negatives include abnormal bleeding (hematology), anxiety, decreased libido, depression, difficulty falling sleep, dyspareunia, history of infertility, nocturia, sexual dysfunction, sleep disturbances, urinary incontinence, urinary urgency, vaginal discharge and vaginal itching. Diet regular; she has cut back on her the foods she eats in culture. The patient states her exercise level is minimal - walks 1-2 times per week.   The patient's tobacco use is:  Social History   Tobacco Use  Smoking Status Never  Smokeless Tobacco Never   She has been exposed to passive smoke. The patient's alcohol use is:  Social History   Substance and Sexual Activity  Alcohol Use No   Additional information: Last pap unknown, scheduled for today  Review of Systems  Constitutional: Negative.   HENT: Negative.    Eyes: Negative.   Respiratory: Negative.    Cardiovascular: Negative.   Gastrointestinal: Negative.   Endocrine: Negative.   Genitourinary: Negative.   Musculoskeletal: Negative.   Skin: Negative.   Allergic/Immunologic: Negative.   Neurological: Negative.   Hematological: Negative.   Psychiatric/Behavioral: Negative.       Today's Vitals   06/20/23 1413 06/20/23 1523  BP: (!) 150/80 120/80  Pulse: 87   Temp: 98.2 F (36.8 C)  TempSrc: Oral   Weight: 193 lb 3.2 oz (87.6 kg)   Height: 4\' 9"  (1.448 m)   PainSc: 0-No pain    Body mass index is 41.81 kg/m.  Wt Readings from Last 3 Encounters:  06/20/23 193 lb 3.2 oz (87.6 kg)  02/16/23 187 lb (84.8 kg)  02/25/22 192 lb (87.1 kg)     Objective:  Physical Exam Exam conducted with a chaperone present.  Constitutional:      General: She is not in acute distress.    Appearance: Normal appearance. She is well-developed. She is obese.  HENT:      Head: Normocephalic and atraumatic.     Right Ear: Hearing, tympanic membrane, ear canal and external ear normal. There is impacted cerumen.     Left Ear: Hearing, tympanic membrane, ear canal and external ear normal. There is no impacted cerumen.     Nose: Nose normal.     Mouth/Throat:     Mouth: Mucous membranes are moist.  Eyes:     General: Lids are normal.     Extraocular Movements: Extraocular movements intact.     Conjunctiva/sclera: Conjunctivae normal.     Pupils: Pupils are equal, round, and reactive to light.     Funduscopic exam:    Right eye: No papilledema.        Left eye: No papilledema.  Neck:     Thyroid: No thyroid mass.     Vascular: No carotid bruit.  Cardiovascular:     Rate and Rhythm: Normal rate and regular rhythm.     Pulses: Normal pulses.     Heart sounds: Normal heart sounds. No murmur heard. Pulmonary:     Effort: Pulmonary effort is normal. No respiratory distress.     Breath sounds: Normal breath sounds. No wheezing.  Chest:     Chest wall: No mass.  Breasts:    Tanner Score is 5.     Right: Normal. No mass or tenderness.     Left: Normal. No mass or tenderness.  Abdominal:     General: Abdomen is flat. Bowel sounds are normal. There is no distension.     Palpations: Abdomen is soft.     Tenderness: There is no abdominal tenderness.  Genitourinary:    General: Normal vulva.     Vagina: Normal. No vaginal discharge.     Uterus: Normal.      Adnexa: Right adnexa normal and left adnexa normal.     Rectum: Normal. Guaiac result negative.     Comments: Difficult to visualize cervix Musculoskeletal:        General: No swelling. Normal range of motion.     Cervical back: Full passive range of motion without pain, normal range of motion and neck supple.     Right lower leg: No edema.     Left lower leg: No edema.  Lymphadenopathy:     Upper Body:     Right upper body: No supraclavicular, axillary or pectoral adenopathy.     Left upper  body: No supraclavicular, axillary or pectoral adenopathy.  Skin:    General: Skin is warm and dry.     Capillary Refill: Capillary refill takes less than 2 seconds.  Neurological:     General: No focal deficit present.     Mental Status: She is alert and oriented to person, place, and time.     Cranial Nerves: No cranial nerve deficit.     Sensory: No sensory deficit.     Motor: No  weakness.  Psychiatric:        Mood and Affect: Mood normal.        Behavior: Behavior normal.        Thought Content: Thought content normal.        Judgment: Judgment normal.         Assessment And Plan:     Encounter for annual health examination Assessment & Plan: Behavior modifications discussed and diet history reviewed.   Pt will continue to exercise regularly and modify diet with low GI, plant based foods and decrease intake of processed foods.  Recommend intake of daily multivitamin, Vitamin D, and calcium.  Recommend mammogram and colonoscopy for preventive screenings, as well as recommend immunizations that include influenza, TDAP, and Shingles (decline)    Essential hypertension Assessment & Plan: B/P is slightly elevated repeat was improved, continue current medications.  Focus on lifestyle modifications. CMP ordered to check renal function.  The importance of regular exercise and dietary modification was stressed to the patient.  Stressed importance of losing ten percent of her body weight to help with B/P control.  The weight loss would help with decreasing cardiac and cancer risk as well.  EKG done with NSR 82   Orders: -     POCT URINALYSIS DIP (CLINITEK) -     Microalbumin / creatinine urine ratio -     EKG 12-Lead -     CMP14+EGFR -     Lipid panel  Morbid obesity (HCC) Assessment & Plan: She is encouraged to strive for BMI less than 30 to decrease cardiac risk. Advised to aim for at least 150 minutes of exercise per week.Encouraged to park further when at the store,  take stair  s instead of elevators and to walk in place during commercials.  Increase water intake to at least one gallon of water daily.     Vitamin D deficiency Assessment & Plan: Will check vitamin D level and supplement as needed.    Also encouraged to spend 15 minutes in the sun daily.     Encounter for Papanicolaou smear of cervix -     Cytology - PAP  Need for Tdap vaccination Assessment & Plan: Will give tetanus vaccine today while in office. Refer to order management. TDAP will be administered to adults 47-69 years old every 10 years.   Orders: -     Tdap vaccine greater than or equal to 7yo IM  BMI 40.0-44.9, adult (HCC) -     Hemoglobin A1c  Other long term (current) drug therapy -     CBC with Differential/Platelet  Pain of both eyes Assessment & Plan: Will refer to eye doctor, no abnormal findings on physical exam.  Orders: -     Ambulatory referral to Ophthalmology  Encounter for dental examination -     Ambulatory referral to Dentistry  Screening for STD (sexually transmitted disease) -     NuSwab Vaginitis Plus (VG+)  Impacted cerumen of right ear Assessment & Plan: Removed cerumen with curette.       Return for 1 year physical, 6 month bp check. Patient was given opportunity to ask questions. Patient verbalized understanding of the plan and was able to repeat key elements of the plan. All questions were answered to their satisfaction.   Arnette Felts, FNP  I, Arnette Felts, FNP, have reviewed all documentation for this visit. The documentation on 06/20/23 for the exam, diagnosis, procedures, and orders are all accurate and complete.

## 2023-06-21 LAB — HEMOGLOBIN A1C
Est. average glucose Bld gHb Est-mCnc: 128 mg/dL
Hgb A1c MFr Bld: 6.1 % — ABNORMAL HIGH (ref 4.8–5.6)

## 2023-06-21 LAB — CMP14+EGFR
ALT: 12 IU/L (ref 0–32)
AST: 18 IU/L (ref 0–40)
Albumin: 4.3 g/dL (ref 3.8–4.9)
Alkaline Phosphatase: 123 IU/L — ABNORMAL HIGH (ref 44–121)
BUN/Creatinine Ratio: 16 (ref 9–23)
BUN: 14 mg/dL (ref 6–24)
Bilirubin Total: 0.5 mg/dL (ref 0.0–1.2)
CO2: 26 mmol/L (ref 20–29)
Calcium: 9.1 mg/dL (ref 8.7–10.2)
Chloride: 103 mmol/L (ref 96–106)
Creatinine, Ser: 0.87 mg/dL (ref 0.57–1.00)
Globulin, Total: 3.6 g/dL (ref 1.5–4.5)
Glucose: 79 mg/dL (ref 70–99)
Potassium: 3.9 mmol/L (ref 3.5–5.2)
Sodium: 143 mmol/L (ref 134–144)
Total Protein: 7.9 g/dL (ref 6.0–8.5)
eGFR: 80 mL/min/{1.73_m2} (ref >59)

## 2023-06-21 LAB — CBC WITH DIFFERENTIAL/PLATELET
Basophils Absolute: 0 10*3/uL (ref 0.0–0.2)
Basos: 1 %
EOS (ABSOLUTE): 0.2 10*3/uL (ref 0.0–0.4)
Eos: 3 %
Hematocrit: 40.2 % (ref 34.0–46.6)
Hemoglobin: 13 g/dL (ref 11.1–15.9)
Immature Grans (Abs): 0 10*3/uL (ref 0.0–0.1)
Immature Granulocytes: 0 %
Lymphocytes Absolute: 2.5 10*3/uL (ref 0.7–3.1)
Lymphs: 39 %
MCH: 28.5 pg (ref 26.6–33.0)
MCHC: 32.3 g/dL (ref 31.5–35.7)
MCV: 88 fL (ref 79–97)
Monocytes Absolute: 0.6 10*3/uL (ref 0.1–0.9)
Monocytes: 9 %
Neutrophils Absolute: 3.2 10*3/uL (ref 1.4–7.0)
Neutrophils: 48 %
Platelets: 208 10*3/uL (ref 150–450)
RBC: 4.56 x10E6/uL (ref 3.77–5.28)
RDW: 14 % (ref 11.7–15.4)
WBC: 6.4 10*3/uL (ref 3.4–10.8)

## 2023-06-21 LAB — LIPID PANEL
Chol/HDL Ratio: 3.9 ratio (ref 0.0–4.4)
Cholesterol, Total: 193 mg/dL (ref 100–199)
HDL: 50 mg/dL (ref >39)
LDL Chol Calc (NIH): 123 mg/dL — ABNORMAL HIGH (ref 0–99)
Triglycerides: 109 mg/dL (ref 0–149)
VLDL Cholesterol Cal: 20 mg/dL (ref 5–40)

## 2023-06-21 LAB — MICROALBUMIN / CREATININE URINE RATIO
Creatinine, Urine: 111.8 mg/dL
Microalb/Creat Ratio: 6 mg/g creat (ref 0–29)
Microalbumin, Urine: 6.7 ug/mL

## 2023-06-22 LAB — CYTOLOGY - PAP
Adequacy: ABSENT
Diagnosis: NEGATIVE

## 2023-06-23 LAB — NUSWAB VAGINITIS PLUS (VG+)
Candida albicans, NAA: NEGATIVE
Candida glabrata, NAA: NEGATIVE
Chlamydia trachomatis, NAA: NEGATIVE
Neisseria gonorrhoeae, NAA: NEGATIVE
Trich vag by NAA: NEGATIVE

## 2023-06-29 DIAGNOSIS — Z6841 Body Mass Index (BMI) 40.0 and over, adult: Secondary | ICD-10-CM | POA: Insufficient documentation

## 2023-06-29 DIAGNOSIS — Z23 Encounter for immunization: Secondary | ICD-10-CM | POA: Insufficient documentation

## 2023-06-29 DIAGNOSIS — Z0000 Encounter for general adult medical examination without abnormal findings: Secondary | ICD-10-CM | POA: Insufficient documentation

## 2023-06-29 DIAGNOSIS — H5713 Ocular pain, bilateral: Secondary | ICD-10-CM | POA: Insufficient documentation

## 2023-06-29 DIAGNOSIS — Z0120 Encounter for dental examination and cleaning without abnormal findings: Secondary | ICD-10-CM | POA: Insufficient documentation

## 2023-06-29 DIAGNOSIS — Z124 Encounter for screening for malignant neoplasm of cervix: Secondary | ICD-10-CM | POA: Insufficient documentation

## 2023-06-29 DIAGNOSIS — Z113 Encounter for screening for infections with a predominantly sexual mode of transmission: Secondary | ICD-10-CM | POA: Insufficient documentation

## 2023-06-29 NOTE — Assessment & Plan Note (Signed)
Will check vitamin D level and supplement as needed.    Also encouraged to spend 15 minutes in the sun daily.   

## 2023-06-29 NOTE — Assessment & Plan Note (Signed)

## 2023-06-29 NOTE — Assessment & Plan Note (Signed)
Will give tetanus vaccine today while in office. Refer to order management. TDAP will be administered to adults 79-53 years old every 10 years.

## 2023-06-29 NOTE — Assessment & Plan Note (Addendum)
B/P is slightly elevated repeat was improved, continue current medications.  Focus on lifestyle modifications. CMP ordered to check renal function.  The importance of regular exercise and dietary modification was stressed to the patient.  Stressed importance of losing ten percent of her body weight to help with B/P control.  The weight loss would help with decreasing cardiac and cancer risk as well.  EKG done with NSR 82

## 2023-06-29 NOTE — Assessment & Plan Note (Signed)
She is encouraged to strive for BMI less than 30 to decrease cardiac risk. Advised to aim for at least 150 minutes of exercise per week.  Encouraged to park further when at the store, take stairs instead of elevators and to walk in place during commercials. Increase water intake to at least one gallon of water daily.  

## 2023-06-29 NOTE — Assessment & Plan Note (Signed)
Will refer to eye doctor, no abnormal findings on physical exam.

## 2023-07-26 ENCOUNTER — Ambulatory Visit (INDEPENDENT_AMBULATORY_CARE_PROVIDER_SITE_OTHER): Payer: BC Managed Care – PPO

## 2023-07-26 ENCOUNTER — Ambulatory Visit
Admission: RE | Admit: 2023-07-26 | Discharge: 2023-07-26 | Disposition: A | Discharge status: Home / Self Care | Payer: BC Managed Care – PPO | Source: Ambulatory Visit | Attending: Nurse Practitioner | Admitting: Nurse Practitioner

## 2023-07-26 VITALS — BP 122/84 | HR 98 | Ht <= 58 in

## 2023-07-26 DIAGNOSIS — Z23 Encounter for immunization: Secondary | ICD-10-CM | POA: Diagnosis not present

## 2023-07-26 DIAGNOSIS — Z1231 Encounter for screening mammogram for malignant neoplasm of breast: Secondary | ICD-10-CM

## 2023-07-26 NOTE — Progress Notes (Signed)
Patient presents today for flu shot.  

## 2023-08-23 ENCOUNTER — Emergency Department (HOSPITAL_COMMUNITY)
Admission: EM | Admit: 2023-08-23 | Discharge: 2023-08-23 | Disposition: A | Discharge status: Home / Self Care | Payer: BC Managed Care – PPO | Attending: Emergency Medicine | Admitting: Emergency Medicine

## 2023-08-23 ENCOUNTER — Other Ambulatory Visit: Payer: Self-pay | Admitting: Nurse Practitioner

## 2023-08-23 ENCOUNTER — Other Ambulatory Visit: Payer: Self-pay

## 2023-08-23 ENCOUNTER — Encounter (HOSPITAL_COMMUNITY): Payer: Self-pay

## 2023-08-23 ENCOUNTER — Emergency Department (HOSPITAL_COMMUNITY): Payer: BC Managed Care – PPO

## 2023-08-23 DIAGNOSIS — M25562 Pain in left knee: Secondary | ICD-10-CM | POA: Insufficient documentation

## 2023-08-23 DIAGNOSIS — Z79899 Other long term (current) drug therapy: Secondary | ICD-10-CM | POA: Insufficient documentation

## 2023-08-23 DIAGNOSIS — I10 Essential (primary) hypertension: Secondary | ICD-10-CM | POA: Diagnosis not present

## 2023-08-23 DIAGNOSIS — G8929 Other chronic pain: Secondary | ICD-10-CM | POA: Insufficient documentation

## 2023-08-23 MED ORDER — ACETAMINOPHEN 500 MG PO TABS
1000.0000 mg | ORAL_TABLET | Freq: Once | ORAL | Status: AC
  Administered 2023-08-23: 1000 mg via ORAL
  Filled 2023-08-23: qty 2

## 2023-08-23 NOTE — ED Provider Notes (Addendum)
[place] EMERGENCY DEPARTMENT AT [facility] Provider Note   CSN: [id number] Arrival date & time: 08/23/23  0820     History  Chief Complaint  Patient presents with   Knee Pain    Margaret Singleton is a 52 y.o. female with history of hypertension, obesity, presenting with concern for chronic left knee pain.  The pain in her left knee has been ongoing for about the past 4 months.  She states the pain is "in her bones".  Denies any injuries to the area.  Was seen by orthopedics and given a steroid injection, or she reports this improved her pain slightly.  Denies any fever or chills, difficulty bending the knee, any recent infections.   Knee Pain      Home Medications Prior to Admission medications   Medication Sig Start Date End Date Taking? Authorizing Provider  clindamycin (CLINDAGEL) 1 % gel Apply topically 2 (two) times daily. 02/25/22   Arnette Felts, FNP  hydrochlorothiazide (HYDRODIURIL) 12.5 MG tablet Take 1 tablet by mouth daily as needed for leg swelling 02/16/23   Arnette Felts, FNP  meloxicam (MOBIC) 15 MG tablet Take 1 tablet (15 mg total) by mouth daily. Take 1 tablet by mouth daily x 5 days then take as needed daily 02/16/23   Arnette Felts, FNP  Vitamin D, Ergocalciferol, (DRISDOL) 1.25 MG (50000 UNIT) CAPS capsule Take 1 capsule (50,000 Units total) by mouth 2 (two) times a week. 02/17/23   Arnette Felts, FNP      Allergies    Oxycodone    Review of Systems   Review of Systems  Musculoskeletal:        Left knee pain    Physical Exam Updated Vital Signs BP (!) 159/98 (BP Location: Right Arm)   Pulse 62   Temp 97.9 F (36.6 C) (Oral)   Resp 16   Ht 4\' 9"  (1.448 m)   LMP 03/30/2019   SpO2 100%   BMI 41.81 kg/m  Physical Exam Vitals and nursing note reviewed.  Constitutional:      Appearance: Normal appearance.  HENT:     Head: Atraumatic.  Cardiovascular:     Rate and Rhythm: Normal rate and regular rhythm.     Comments: Dorsalis pedis  pulse 2+ bilaterally Pulmonary:     Effort: Pulmonary effort is normal.  Musculoskeletal:     Comments: Left lower extremity: No obvious deformity.  There is slight edema of the left knee that is symmetrical with the right knee.  No erythema of the left knee. Knee is not hot to touch. Patient able to flex and extend the left knee with some pain. TTP palpation along the medial joint line.  No tenderness palpation along the lateral joint line, patellar tendon, quadriceps tendon, femur, proximal tibia, popliteal fossa.    Neurological:     General: No focal deficit present.     Mental Status: She is alert.  Psychiatric:        Mood and Affect: Mood normal.        Behavior: Behavior normal.     ED Results / Procedures / Treatments   Labs (all labs ordered are listed, but only abnormal results are displayed) Labs Reviewed - No data to display  EKG None  Radiology DG Knee Complete 4 Views Left  Result Date: 08/23/2023 CLINICAL DATA:  Chronic left knee pain. EXAM: LEFT KNEE - COMPLETE 4+ VIEW COMPARISON:  None Available. FINDINGS: Standing frontal view both knees obtained. Tunnel, lateral, and sunrise  views of the left knee. Mild lateral patellar tilt. Slight valgus orientation. No fracture, erosion or focal bone abnormality. No significant knee joint effusion. Trace peripheral spurring. Frontal view of the right knee demonstrates mild valgus orientation without acute abnormality in frontal projection. IMPRESSION: Mild lateral patellar tilt and valgus orientation of the left knee. Trace peripheral spurring. Electronically Signed   By: Narda Rutherford M.D.   On: 08/23/2023 11:35    Procedures Procedures    Medications Ordered in ED Medications - No data to display  ED Course/ Medical Decision Making/ A&P                                 Medical Decision Making Amount and/or Complexity of Data Reviewed Radiology: ordered.  Risk OTC drugs.   52 y.o. female with pertinent past  medical history of HTN, obesity presents to the ED for concern of left knee pain  Differential diagnosis includes but is not limited to fracture, dislocation, septic arthritis, osteoarthritis, soft tissue injury  ED Course:  Patient presents with chronic left knee pain that has been ongoing for 4 months.  It is not gotten any significantly worse, just wanted to get it evaluated.  She did get a steroid injection from orthopedics and reported improvement from this.  There is mild edema of the left knee that is symmetrical with the right knee.  No erythema and only mildly tender to palpation along the medial joint line.  No tenderness to palpation anywhere else in the knee. Left knee is not hot to touch. Able to flex and extend the left knee fully with mild pain but without true difficulty. No recent infections, vital signs stable.  No concern for septic arthritis at this time.  X-rays show slight valgus deformity of the left knee which could be contributing to her pain.  Feel she is appropriate for discharge with close follow-up with her orthopedic provider who she is already established with  Patient given tylenol for pain   Impression: Left knee pain  Disposition:  The patient was discharged home with instructions to follow-up with her orthopedic provider within the next 2 weeks, Tylenol and topical diclofenac gel as needed for pain. Return precautions given.  Imaging Studies ordered: I ordered imaging studies including x-ray knees bilaterally I independently visualized the imaging with scope of interpretation limited to determining acute life threatening conditions related to emergency care. Imaging showed mild valgus orientation of the left knee, patellar tilt I agree with the radiologist interpretation   I reviewed her visit with orthopedics from 03/2023 where she received steroid injection and was told to follow-up if not improving.          Final Clinical Impression(s) / ED  Diagnoses Final diagnoses:  Chronic pain of left knee    Rx / DC Orders ED Discharge Orders     None         Arabella Merles, PA-C 08/23/23 1216    Arabella Merles, PA-C 08/23/23 1217    Arabella Merles, PA-C 08/23/23 1218    Lonell Grandchild, MD 08/23/23 1330

## 2023-08-23 NOTE — ED Triage Notes (Signed)
Pt c.o chronic left knee pain. Pt was seen by her PCP 2 months ago and had injection in knee for pain. Pt ambulatory. Denies recent fall or injury

## 2023-08-23 NOTE — Discharge Instructions (Addendum)
Your x-ray is reassuring today.  The radiologist reported that the orientation of your left knee is slightly more angulated, which could be contributing to your pain.  Please follow-up with your orthopedic provider, Margaret Bali Persons, PA for a follow-up appointment regarding your knee in the next 2 weeks.  You may take up to 1000mg  of tylenol every 6 hours as needed for pain.  Do not take more then 4g per day.  You may use topical Voltaren (diclofenac) gel on the knee to help with pain.  This is a medication you can get over-the-counter at any drugstore.  Return to the ER if your knee gets significantly more swollen or painful, your knee gets very red, you develop fever or chills, you are unable to bend your knee, any other new or concerning symptoms.

## 2023-08-25 DIAGNOSIS — H6121 Impacted cerumen, right ear: Secondary | ICD-10-CM | POA: Insufficient documentation

## 2023-08-25 NOTE — Assessment & Plan Note (Signed)
-  Removed cerumen with curette

## 2023-09-01 ENCOUNTER — Ambulatory Visit: Payer: BC Managed Care – PPO | Admitting: Physician Assistant

## 2023-09-01 ENCOUNTER — Encounter: Payer: Self-pay | Admitting: Physician Assistant

## 2023-09-01 DIAGNOSIS — M25562 Pain in left knee: Secondary | ICD-10-CM

## 2023-09-01 NOTE — Progress Notes (Signed)
Office Visit Note   Patient: Margaret Singleton           Date of Birth: [birth year]           MRN: [id number] Visit Date: 09/01/2023              Requested by: Arnette Felts, FNP [address] [place],  [place] [address] PCP: Arnette Felts, FNP  Chief Complaint  Patient presents with   Left Knee - Pain      HPI: Margaret Singleton is a 52 year old woman who is here to follow-up on her left knee pain.  I saw her a few months ago where she complained of left knee pain with no particular injury.  Her x-rays did not show a lot of degenerative changes.  Did try a steroid injection which she said honestly did not help her very much.  She has tried Tylenol and Voltaren gel.  She also thinks that sometimes her knee "locks up and points to the medial side of her knee.  Her pain was significant enough that she went to the emergency room about 7 or 8 days ago did not have a new injury.  She feels she is not getting better  Assessment & Plan: Visit Diagnoses:  1. Acute pain of left knee     Plan: Left knee pain stability is fine however because of her mechanical symptoms and length of time of 4 months without any improvement I recommend an MRI.  If the MRI is normal or just show some degenerative changes I would refer her to physical therapy if she shows any significant meniscal or other ligamentous or cartilage pathology would refer her to one of our surgeons  Follow-Up Instructions: No follow-ups on file.   Ortho Exam  Patient is alert, oriented, no adenopathy, well-dressed, normal affect, normal respiratory effort. Left knee no effusion no erythema compartments are soft and nontender she does have some tenderness over the medial joint line.  Good quadricep patella tendon strength.  Negative Denna Haggard' sign she has good varus valgus stability  Imaging: No results found. No images are attached to the encounter.  Labs: Lab Results  Component Value Date   HGBA1C 6.1 (H) 06/20/2023    HGBA1C 5.6 02/16/2023   HGBA1C 5.6 02/26/2021   REPTSTATUS 02/21/2014 FINAL 02/19/2014   CULT  02/19/2014    Multiple bacterial morphotypes present, none predominant. Suggest appropriate recollection if clinically indicated. Performed at [facility]     Lab Results  Component Value Date   ALBUMIN 4.3 06/20/2023   ALBUMIN 3.9 02/25/2022   ALBUMIN 2.5 (L) 10/23/2020    No results found for: "MG" Lab Results  Component Value Date   VD25OH 16.5 (L) 02/16/2023   VD25OH 13.4 (L) 02/25/2022   VD25OH 17.9 (L) 06/06/2019    No results found for: "PREALBUMIN"    Latest Ref Rng & Units 06/20/2023    3:19 PM 10/23/2020   12:22 AM 10/22/2020   12:26 AM  CBC EXTENDED  WBC 3.4 - 10.8 x10E3/uL 6.4  10.1  10.3   RBC 3.77 - 5.28 x10E6/uL 4.56  3.97  4.38   Hemoglobin 11.1 - 15.9 g/dL 19.1  47.8  29.5   HCT 34.0 - 46.6 % 40.2  34.9  39.1   Platelets 150 - 450 x10E3/uL 208  183  194   NEUT# 1.4 - 7.0 x10E3/uL 3.2     Lymph# 0.7 - 3.1 x10E3/uL 2.5        There is  no height or weight on file to calculate BMI.  Orders:  Orders Placed This Encounter  Procedures   MR Knee Left w/o contrast   No orders of the defined types were placed in this encounter.    Procedures: No procedures performed  Clinical Data: No additional findings.  ROS:  All other systems negative, except as noted in the HPI. Review of Systems  Objective: Vital Signs: LMP 03/30/2019   Specialty Comments:  No specialty comments available.  PMFS History: Patient Active Problem List   Diagnosis Date Noted   Impacted cerumen of right ear 08/25/2023   Encounter for annual health examination 06/29/2023   Encounter for Papanicolaou smear of cervix 06/29/2023   Need for Tdap vaccination 06/29/2023   BMI 40.0-44.9, adult (HCC) 06/29/2023   Pain of both eyes 06/29/2023   Encounter for dental examination 06/29/2023   Screening for STD (sexually transmitted disease) 06/29/2023   Pain in left knee  04/04/2023   Morbid obesity (HCC) 02/15/2023   Abdominal pain 10/20/2020   Vitamin D deficiency 06/22/2019   Chronic nonintractable headache 03/12/2019   Edema 03/12/2019   Essential hypertension 02/12/2019   Past Medical History:  Diagnosis Date   Acute appendicitis 02/20/2014   Cholelithiasis 02/21/2014   Endometrial polyp    History of diverticulitis of colon 02/2017   History of ectopic pregnancy    Hypertension    PMB (postmenopausal bleeding)    Swelling of both ankles 02/12/2019   Thickened endometrium     Family History  Problem Relation Age of Onset   Healthy Mother    Healthy Father    Breast cancer Neg Hx     Past Surgical History:  Procedure Laterality Date   CHOLECYSTECTOMY N/A 10/21/2020   Procedure: LAPAROSCOPIC CHOLECYSTECTOMY;  Surgeon: Andria Meuse, MD;  Location: [facility] OR;  Service: General;  Laterality: N/A;   DILATATION & CURETTAGE/HYSTEROSCOPY WITH MYOSURE N/A 06/04/2019   Procedure: DILATATION & CURETTAGE/HYSTEROSCOPY WITH MYOSURE;  Surgeon: Romualdo Bolk, MD;  Location: [facility];  Service: Gynecology;  Laterality: N/A;  follow previous case   ECTOPIC PREGNANCY SURGERY  early 2000s  in [place]   LAPAROSCOPIC APPENDECTOMY N/A 02/20/2014   Procedure: APPENDECTOMY LAPAROSCOPIC;  Surgeon: Liz Malady, MD;  Location: [facility] OR;  Service: General;  Laterality: N/A;   LYSIS OF ADHESION N/A 10/21/2020   Procedure: LYSIS OF ADHESION;  Surgeon: Andria Meuse, MD;  Location: [facility] OR;  Service: General;  Laterality: N/A;   Social History   Occupational History   Not on file  Tobacco Use   Smoking status: Never   Smokeless tobacco: Never  Vaping Use   Vaping status: Never Used  Substance and Sexual Activity   Alcohol use: No   Drug use: Never   Sexual activity: Not Currently    Birth control/protection: None

## 2023-09-26 ENCOUNTER — Encounter: Payer: Self-pay | Admitting: Physician Assistant

## 2023-09-29 ENCOUNTER — Ambulatory Visit
Admission: RE | Admit: 2023-09-29 | Discharge: 2023-09-29 | Disposition: A | Discharge status: Home / Self Care | Payer: BC Managed Care – PPO | Source: Ambulatory Visit | Attending: Physician Assistant | Admitting: Physician Assistant

## 2023-09-29 DIAGNOSIS — M25562 Pain in left knee: Secondary | ICD-10-CM

## 2023-10-10 ENCOUNTER — Ambulatory Visit: Payer: BC Managed Care – PPO | Admitting: Physician Assistant

## 2023-10-12 ENCOUNTER — Encounter: Payer: Self-pay | Admitting: Physician Assistant

## 2023-10-12 ENCOUNTER — Ambulatory Visit: Payer: BC Managed Care – PPO | Admitting: Physician Assistant

## 2023-10-12 DIAGNOSIS — M25562 Pain in left knee: Secondary | ICD-10-CM | POA: Diagnosis not present

## 2023-10-12 MED ORDER — METHYLPREDNISOLONE ACETATE 40 MG/ML IJ SUSP
40.0000 mg | INTRAMUSCULAR | Status: AC | PRN
Start: 2023-10-12 — End: 2023-10-12
  Administered 2023-10-12: 40 mg via INTRA_ARTICULAR

## 2023-10-12 MED ORDER — LIDOCAINE HCL 1 % IJ SOLN
3.0000 mL | INTRAMUSCULAR | Status: AC | PRN
Start: 2023-10-12 — End: 2023-10-12
  Administered 2023-10-12: 3 mL

## 2023-10-12 NOTE — Progress Notes (Signed)
Office Visit Note   Patient: Margaret Singleton           Date of Birth: 11/02/1971           MRN: [id number] Visit Date: 10/12/2023              Requested by: Arnette Felts, FNP [address] [place],  [place] [address] PCP: Arnette Felts, FNP  No chief complaint on file.     HPI: Patient is a 52 year old woman with a several month history of left knee pain.  No particular injury.  She has failed conservative treatment including rest as well as steroid injections as she was not getting better and actually has been to the emergency room as recently as September and had mechanical symptoms we agreed on an MRI she is here to review this today no new changes in her knee   Assessment & Plan: Visit Diagnoses: Left knee pain  Plan: I reviewed the MRI with her which shows fairly well-preserved cartilage and a peripheral posterior medial meniscus tear.  She has not had much luck with injections in the past so based on the x-rays and the fact that she really does not have much arthritis I think it would be good for her to visit with one of our surgeons.  She is not interested this right now but would like to try another shot will call me if this does not work  Follow-Up Instructions: No follow-ups on file.   Ortho Exam  Patient is alert, oriented, no adenopathy, well-dressed, normal affect, normal respiratory effort. Left knee no effusion no erythema compartments are soft and compressible she is neurovascular intact  Imaging: No results found. No images are attached to the encounter.  Labs: Lab Results  Component Value Date   HGBA1C 6.1 (H) 06/20/2023   HGBA1C 5.6 02/16/2023   HGBA1C 5.6 02/26/2021   REPTSTATUS 02/21/2014 FINAL 02/19/2014   CULT  02/19/2014    Multiple bacterial morphotypes present, none predominant. Suggest appropriate recollection if clinically indicated. Performed at [facility] Results  Component Value Date   ALBUMIN 4.3  06/20/2023   ALBUMIN 3.9 02/25/2022   ALBUMIN 2.5 (L) 10/23/2020    No results found for: "MG" Lab Results  Component Value Date   VD25OH 16.5 (L) 02/16/2023   VD25OH 13.4 (L) 02/25/2022   VD25OH 17.9 (L) 06/06/2019    No results found for: "PREALBUMIN"    Latest Ref Rng & Units 06/20/2023    3:19 PM 10/23/2020   12:22 AM 10/22/2020   12:26 AM  CBC EXTENDED  WBC 3.4 - 10.8 x10E3/uL 6.4  10.1  10.3   RBC 3.77 - 5.28 x10E6/uL 4.56  3.97  4.38   Hemoglobin 11.1 - 15.9 g/dL 52.8  41.3  24.4   HCT 34.0 - 46.6 % 40.2  34.9  39.1   Platelets 150 - 450 x10E3/uL 208  183  194   NEUT# 1.4 - 7.0 x10E3/uL 3.2     Lymph# 0.7 - 3.1 x10E3/uL 2.5        There is no height or weight on file to calculate BMI.  Orders:  No orders of the defined types were placed in this encounter.  No orders of the defined types were placed in this encounter.    Procedures: Large Joint Inj: L knee on 10/12/2023 2:09 PM Indications: pain and diagnostic evaluation Details: 25 G 1.5 in needle, anteromedial approach  Arthrogram: No  Medications: 40  mg methylPREDNISolone acetate 40 MG/ML; 3 mL lidocaine 1 % Outcome: tolerated well, no immediate complications Procedure, treatment alternatives, risks and benefits explained, specific risks discussed. Consent was given by the patient.    Clinical Data: No additional findings.  ROS:  All other systems negative, except as noted in the HPI. Review of Systems  Objective: Vital Signs: LMP 03/30/2019   Specialty Comments:  No specialty comments available.  PMFS History: Patient Active Problem List   Diagnosis Date Noted  . Impacted cerumen of right ear 08/25/2023  . Encounter for annual health examination 06/29/2023  . Encounter for Papanicolaou smear of cervix 06/29/2023  . Need for Tdap vaccination 06/29/2023  . BMI 40.0-44.9, adult (HCC) 06/29/2023  . Pain of both eyes 06/29/2023  . Encounter for dental examination 06/29/2023  . Screening  for STD (sexually transmitted disease) 06/29/2023  . Pain in left knee 04/04/2023  . Morbid obesity (HCC) 02/15/2023  . Abdominal pain 10/20/2020  . Vitamin D deficiency 06/22/2019  . Chronic nonintractable headache 03/12/2019  . Edema 03/12/2019  . Essential hypertension 02/12/2019   Past Medical History:  Diagnosis Date  . Acute appendicitis 02/20/2014  . Cholelithiasis 02/21/2014  . Endometrial polyp   . History of diverticulitis of colon 02/2017  . History of ectopic pregnancy   . Hypertension   . PMB (postmenopausal bleeding)   . Swelling of both ankles 02/12/2019  . Thickened endometrium     Family History  Problem Relation Age of Onset  . Healthy Mother   . Healthy Father   . Breast cancer Neg Hx     Past Surgical History:  Procedure Laterality Date  . CHOLECYSTECTOMY N/A 10/21/2020   Procedure: LAPAROSCOPIC CHOLECYSTECTOMY;  Surgeon: Andria Meuse, MD;  Location: [facility] OR;  Service: General;  Laterality: N/A;  . DILATATION & CURETTAGE/HYSTEROSCOPY WITH MYOSURE N/A 06/04/2019   Procedure: DILATATION & CURETTAGE/HYSTEROSCOPY WITH MYOSURE;  Surgeon: Romualdo Bolk, MD;  Location: [facility];  Service: Gynecology;  Laterality: N/A;  follow previous case  . ECTOPIC PREGNANCY SURGERY  early 2000s  in [place]  . LAPAROSCOPIC APPENDECTOMY N/A 02/20/2014   Procedure: APPENDECTOMY LAPAROSCOPIC;  Surgeon: Liz Malady, MD;  Location: [facility] OR;  Service: General;  Laterality: N/A;  . LYSIS OF ADHESION N/A 10/21/2020   Procedure: LYSIS OF ADHESION;  Surgeon: Andria Meuse, MD;  Location: [facility] OR;  Service: General;  Laterality: N/A;   Social History   Occupational History  . Not on file  Tobacco Use  . Smoking status: Never  . Smokeless tobacco: Never  Vaping Use  . Vaping status: Never Used  Substance and Sexual Activity  . Alcohol use: No  . Drug use: Never  . Sexual activity: Not Currently    Birth control/protection: None

## 2023-12-22 ENCOUNTER — Ambulatory Visit: Payer: 59 | Admitting: Nurse Practitioner

## 2023-12-22 NOTE — Progress Notes (Deleted)
 Madelaine Bhat, CMA,acting as a Neurosurgeon for Arnette Felts, FNP.,have documented all relevant documentation on the behalf of Arnette Felts, FNP,as directed by  Arnette Felts, FNP while in the presence of Arnette Felts, FNP.  Subjective:  Patient ID: Margaret Singleton , female    DOB: [birth year] , 53 y.o.   MRN: [id number]  No chief complaint on file.   HPI  Patient presents today for a bp follow up, Patient reports compliance with medication. Patient denies any chest pain, SOB, or headaches. Patient has no concerns today.     Past Medical History:  Diagnosis Date  . Acute appendicitis 02/20/2014  . Cholelithiasis 02/21/2014  . Endometrial polyp   . History of diverticulitis of colon 02/2017  . History of ectopic pregnancy   . Hypertension   . PMB (postmenopausal bleeding)   . Swelling of both ankles 02/12/2019  . Thickened endometrium      Family History  Problem Relation Age of Onset  . Healthy Mother   . Healthy Father   . Breast cancer Neg Hx      Current Outpatient Medications:  .  clindamycin (CLINDAGEL) 1 % gel, Apply topically 2 (two) times daily., Disp: 30 g, Rfl: 0 .  hydrochlorothiazide (HYDRODIURIL) 12.5 MG tablet, Take 1 tablet by mouth daily as needed for leg swelling, Disp: 90 tablet, Rfl: 1 .  meloxicam (MOBIC) 15 MG tablet, TAKE 1 TABLET(15 MG) BY MOUTH DAILY FOR 5 DAYS THEN AS NEEDED, Disp: 30 tablet, Rfl: 0 .  Vitamin D, Ergocalciferol, (DRISDOL) 1.25 MG (50000 UNIT) CAPS capsule, Take 1 capsule (50,000 Units total) by mouth 2 (two) times a week., Disp: 24 capsule, Rfl: 1   Allergies  Allergen Reactions  . Oxycodone Itching     Review of Systems   There were no vitals filed for this visit. There is no height or weight on file to calculate BMI.  Wt Readings from Last 3 Encounters:  06/20/23 193 lb 3.2 oz (87.6 kg)  02/16/23 187 lb (84.8 kg)  02/25/22 192 lb (87.1 kg)    The 10-year ASCVD risk score (Arnett DK, et al., 2019) is: 8.4%   Values used to  calculate the score:     Age: 89 years     Sex: Female     Is Non-Hispanic African American: Yes     Diabetic: No     Tobacco smoker: No     Systolic Blood Pressure: 149 mmHg     Is BP treated: Yes     HDL Cholesterol: 50 mg/dL     Total Cholesterol: 193 mg/dL  Objective:  Physical Exam      Assessment And Plan:  Essential hypertension    No follow-ups on file.  Patient was given opportunity to ask questions. Patient verbalized understanding of the plan and was able to repeat key elements of the plan. All questions were answered to their satisfaction.    Jeanell Sparrow, FNP, have reviewed all documentation for this visit. The documentation on 12/22/23 for the exam, diagnosis, procedures, and orders are all accurate and complete.   IF YOU HAVE BEEN REFERRED TO A SPECIALIST, IT MAY TAKE 1-2 WEEKS TO SCHEDULE/PROCESS THE REFERRAL. IF YOU HAVE NOT HEARD FROM US/SPECIALIST IN TWO WEEKS, PLEASE GIVE [place] A CALL AT [phone].

## 2024-01-02 ENCOUNTER — Encounter (HOSPITAL_COMMUNITY): Payer: Self-pay

## 2024-01-02 ENCOUNTER — Emergency Department (HOSPITAL_COMMUNITY): Payer: 59

## 2024-01-02 ENCOUNTER — Emergency Department (HOSPITAL_COMMUNITY)
Admission: EM | Admit: 2024-01-02 | Discharge: 2024-01-02 | Disposition: A | Discharge status: Home / Self Care | Payer: 59 | Attending: Emergency Medicine | Admitting: Emergency Medicine

## 2024-01-02 ENCOUNTER — Other Ambulatory Visit: Payer: Self-pay

## 2024-01-02 DIAGNOSIS — R059 Cough, unspecified: Secondary | ICD-10-CM | POA: Diagnosis present

## 2024-01-02 DIAGNOSIS — Z79899 Other long term (current) drug therapy: Secondary | ICD-10-CM | POA: Insufficient documentation

## 2024-01-02 DIAGNOSIS — U071 COVID-19: Secondary | ICD-10-CM | POA: Diagnosis not present

## 2024-01-02 DIAGNOSIS — M25562 Pain in left knee: Secondary | ICD-10-CM | POA: Insufficient documentation

## 2024-01-02 DIAGNOSIS — I10 Essential (primary) hypertension: Secondary | ICD-10-CM | POA: Insufficient documentation

## 2024-01-02 LAB — CBC
HCT: 39.2 % (ref 36.0–46.0)
Hemoglobin: 12.6 g/dL (ref 12.0–15.0)
MCH: 28.9 pg (ref 26.0–34.0)
MCHC: 32.1 g/dL (ref 30.0–36.0)
MCV: 89.9 fL (ref 80.0–100.0)
Platelets: 194 10*3/uL (ref 150–400)
RBC: 4.36 MIL/uL (ref 3.87–5.11)
RDW: 14.3 % (ref 11.5–15.5)
WBC: 4.5 10*3/uL (ref 4.0–10.5)
nRBC: 0 % (ref 0.0–0.2)

## 2024-01-02 LAB — BASIC METABOLIC PANEL
Anion gap: 8 (ref 5–15)
BUN: 9 mg/dL (ref 6–20)
CO2: 24 mmol/L (ref 22–32)
Calcium: 8.9 mg/dL (ref 8.9–10.3)
Chloride: 109 mmol/L (ref 98–111)
Creatinine, Ser: 0.76 mg/dL (ref 0.44–1.00)
GFR, Estimated: >60 mL/min (ref >60)
Glucose, Bld: 117 mg/dL — ABNORMAL HIGH (ref 70–99)
Potassium: 3.3 mmol/L — ABNORMAL LOW (ref 3.5–5.1)
Sodium: 141 mmol/L (ref 135–145)

## 2024-01-02 LAB — RESP PANEL BY RT-PCR (RSV, FLU A&B, COVID)  RVPGX2
Influenza A by PCR: NEGATIVE
Influenza B by PCR: NEGATIVE
Resp Syncytial Virus by PCR: NEGATIVE
SARS Coronavirus 2 by RT PCR: POSITIVE — AB

## 2024-01-02 MED ORDER — POTASSIUM CHLORIDE CRYS ER 20 MEQ PO TBCR
40.0000 meq | EXTENDED_RELEASE_TABLET | Freq: Once | ORAL | Status: AC
  Administered 2024-01-02: 40 meq via ORAL
  Filled 2024-01-02: qty 2

## 2024-01-02 MED ORDER — ACETAMINOPHEN 325 MG PO TABS
650.0000 mg | ORAL_TABLET | Freq: Four times a day (QID) | ORAL | Status: DC | PRN
  Administered 2024-01-02: 650 mg via ORAL
  Filled 2024-01-02: qty 2

## 2024-01-02 NOTE — ED Triage Notes (Signed)
Pt. Stated, pain all over , sweating a lot, Im also having left knee pain. Started yesterday.

## 2024-01-02 NOTE — ED Provider Notes (Signed)
[place] EMERGENCY DEPARTMENT AT [facility] Provider Note   CSN: [id number] Arrival date & time: 01/02/24  4098  History Chief Complaint  Patient presents with   Excessive Sweating   left knee pain   Cough   generalized pain    Margaret Singleton is a 53 y.o. female.  HPI Margaret Singleton is a 53 YO F with a history of HTN presenting to the ED with a one day history of myalgias and left knee pain.  Yesterday evening, she developed muscle aches, headache, sweating, night sweats, and subjective fevers.  She also endorses mild chest pain shortness of breath.  Of note, on Wednesday she was exposed to a sick coworker.  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  Her left knee pain is chronic.  She has not tested herself for COVID.  She has not taken any medications to alleviate her symptoms.  Home Medications Prior to Admission medications   Medication Sig Start Date End Date Taking? Authorizing Provider  clindamycin (CLINDAGEL) 1 % gel Apply topically 2 (two) times daily. 02/25/22   Arnette Felts, FNP  hydrochlorothiazide (HYDRODIURIL) 12.5 MG tablet Take 1 tablet by mouth daily as needed for leg swelling 02/16/23   Arnette Felts, FNP  meloxicam (MOBIC) 15 MG tablet TAKE 1 TABLET(15 MG) BY MOUTH DAILY FOR 5 DAYS THEN AS NEEDED 08/24/23   Arnette Felts, FNP  Vitamin D, Ergocalciferol, (DRISDOL) 1.25 MG (50000 UNIT) CAPS capsule Take 1 capsule (50,000 Units total) by mouth 2 (two) times a week. 02/17/23   Arnette Felts, FNP      Allergies    Oxycodone    Review of Systems   Review of Systems  Constitutional:  Positive for chills, diaphoresis and fever.  Respiratory:  Positive for cough and shortness of breath.   Cardiovascular:  Positive for chest pain.    Physical Exam Updated Vital Signs BP 136/73   Pulse 94   Temp 99.3 F (37.4 C) (Oral)   Resp 18   Ht 4\' 9"  (1.448 m)   Wt 87.6 kg   LMP 03/30/2019   SpO2 100%   BMI 41.79 kg/m   Physical Exam Constitutional:      Appearance: Normal appearance.  HENT:     Head: Normocephalic and atraumatic.  Cardiovascular:     Rate and Rhythm: Normal rate and regular rhythm.  Pulmonary:     Effort: Pulmonary effort is normal.     Breath sounds: Normal breath sounds.  Abdominal:     General: Abdomen is flat. Bowel sounds are normal.     Palpations: Abdomen is soft.  Neurological:     General: No focal deficit present.     Mental Status: She is alert.     ED Results / Procedures / Treatments   Labs (all labs ordered are listed, but only abnormal results are displayed) Labs Reviewed  RESP PANEL BY RT-PCR (RSV, FLU A&B, COVID)  RVPGX2 - Abnormal; Notable for the following components:      Result Value   SARS Coronavirus 2 by RT PCR POSITIVE (*)    All other components within normal limits  BASIC METABOLIC PANEL - Abnormal; Notable for the following components:   Potassium 3.3 (*)    Glucose, Bld 117 (*)    All other components within normal limits  CBC    EKG None  Radiology DG Chest Portable 1 View Result Date: 01/02/2024 CLINICAL DATA:  Shortness of breath with some  chest pain, fever and night sweats. EXAM: PORTABLE CHEST 1 VIEW COMPARISON:  Radiographs 01/11/2018 and 02/26/2015. FINDINGS: 0852 hours. Stable mild cardiac enlargement. The mediastinal contours are unchanged. The lungs remain clear. No pleural effusion or pneumothorax. No acute osseous findings are evident. IMPRESSION: No evidence of acute cardiopulmonary process. Stable mild cardiac enlargement. Electronically Signed   By: Carey Bullocks M.D.   On: 01/02/2024 09:23    Medications Ordered in ED Medications  acetaminophen (TYLENOL) tablet 650 mg (has no administration in time range)  potassium chloride SA (KLOR-CON M) CR tablet 40 mEq (40 mEq Oral Given 01/02/24 1044)    ED Course/ Medical Decision Making/ A&P Medical Decision Making 53 year old female presenting with 1 day history of diffuse  myalgias and headache.  Differential diagnosis may include but not limited to upper respiratory tract infection, pneumonia, ACS, or PE.  Amount and/or Complexity of Data Reviewed Labs: ordered.    Details: CBC WNL.  BMP demonstrated hypokalemia of 3.2. Respiratory panel positive for COVID-19.  Radiology: ordered.    Details: Chest x-ray demonstrated no evidence of acute cardiopulmonary process. Stable mild cardiac enlargement. Discussion of management or test interpretation with external provider(s): Patient presenting with history of diffuse myalgias, night sweats, chills, subjective fever, and recent sick exposure.  She was found to be positive for COVID-19, which was likely the etiology of her symptoms.  Her chest x-ray was negative for any signs of pneumonia.  CBC was negative for any leukocytosis.  She was found to have a potassium of 3.2, so this was replenished.  She did note some improvement with her Tylenol.  At this time, she is safe for discharge.  Patient was counseled on return to ED precautions.  Risk OTC drugs. Prescription drug management.   Final Clinical Impression(s) / ED Diagnoses Final diagnoses:  COVID-19    Rx / DC Orders ED Discharge Orders     None         Morrie Sheldon, MD 01/02/24 1254    Gerhard Munch, MD 01/10/24 [id number]

## 2024-01-02 NOTE — ED Notes (Signed)
Pt has trace edema of left knee. Pt denies injury to knee.

## 2024-01-02 NOTE — Discharge Instructions (Addendum)
Ms. Burnstein,  You were seen in the ED for muscle aches and a headache.  This was likely caused from your positive test for COVID-19.  You also had a low potassium, so this was replenished.  For your pain, you can take Tylenol 2 tablets (325 mg per tablet) as needed every 4-6 hours as needed.  Please ensure that you also get plenty of rest stay hydrated.  At this time, we feel that you are safe for discharge.  Please return to the ED if your shortness of breath, chest pain, or other symptoms do not improve or worsen in the coming days.

## 2024-01-04 ENCOUNTER — Ambulatory Visit: Payer: 59 | Admitting: Physician Assistant

## 2024-01-18 ENCOUNTER — Encounter: Payer: Self-pay | Admitting: Physician Assistant

## 2024-01-18 ENCOUNTER — Ambulatory Visit: Payer: 59 | Admitting: Physician Assistant

## 2024-01-18 DIAGNOSIS — M25562 Pain in left knee: Secondary | ICD-10-CM | POA: Diagnosis not present

## 2024-01-18 NOTE — Progress Notes (Signed)
 Office Visit Note   Patient: Margaret Singleton           Date of Birth: 1971/09/15           MRN: [id number] Visit Date: 01/18/2024              Requested by: Arnette Felts, FNP [address] [place],  [place] [address] PCP: Arnette Felts, FNP  Chief Complaint  Patient presents with   Left Knee - Pain      HPI: Patient is seen in follow-up today for her left knee.  She has had difficulties with her knee.  She has over 1 year history of left knee pain.  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  I did do an MRI as she did not have very significant degenerative findings on her x-ray.  She has had a couple injections that have helped for short period of time.  No new injury she is here inquiring about gel injections.  Assessment & Plan: Visit Diagnoses: Left knee pain  Plan: I discussed with her that she really does not have a lot of arthritis in her knee and therefore gel injections would not be appropriate.  Again I talked her about this meniscal finding on her MRI.  I think she should at least visit with one of our surgeons to know her options.  Will make arrangements for that with her today.  Follow-Up Instructions:   Ortho Exam  Patient is alert, oriented, no adenopathy, well-dressed, normal affect, normal respiratory effort. Examination of her left knee she is neurovascular intact she has significant hyperextension.  She does have reproducible pain with flexion over the medial posterior medial joint line she is neurovascular intact compartments are soft and nontender no erythema or effusion  Imaging: No results found. No images are attached to the encounter.  Labs: Lab Results  Component Value Date   HGBA1C 6.1 (H) 06/20/2023   HGBA1C 5.6 02/16/2023   HGBA1C 5.6 02/26/2021   REPTSTATUS 02/21/2014 FINAL 02/19/2014   CULT  02/19/2014    Multiple bacterial morphotypes present, none predominant. Suggest appropriate  recollection if clinically indicated. Performed at [facility] Results  Component Value Date   ALBUMIN 4.3 06/20/2023   ALBUMIN 3.9 02/25/2022   ALBUMIN 2.5 (L) 10/23/2020    No results found for: "MG" Lab Results  Component Value Date   VD25OH 16.5 (L) 02/16/2023   VD25OH 13.4 (L) 02/25/2022   VD25OH 17.9 (L) 06/06/2019    No results found for: "PREALBUMIN"    Latest Ref Rng & Units 01/02/2024    9:11 AM 06/20/2023    3:19 PM 10/23/2020   12:22 AM  CBC EXTENDED  WBC 4.0 - 10.5 K/uL 4.5  6.4  10.1   RBC 3.87 - 5.11 MIL/uL 4.36  4.56  3.97   Hemoglobin 12.0 - 15.0 g/dL 28.4  13.2  44.0   HCT 36.0 - 46.0 % 39.2  40.2  34.9   Platelets 150 - 400 K/uL 194  208  183   NEUT# 1.4 - 7.0 x10E3/uL  3.2    Lymph# 0.7 - 3.1 x10E3/uL  2.5       There is no height or weight on file to calculate BMI.  Orders:  No orders of the defined types were placed in this encounter.  No orders of the defined types were placed in this encounter.  Procedures: No procedures performed  Clinical Data: No additional findings.  ROS:  All other systems negative, except as noted in the HPI. Review of Systems  Objective: Vital Signs: LMP 03/30/2019   Specialty Comments:  No specialty comments available.  PMFS History: Patient Active Problem List   Diagnosis Date Noted   Impacted cerumen of right ear 08/25/2023   Encounter for annual health examination 06/29/2023   Encounter for Papanicolaou smear of cervix 06/29/2023   Need for Tdap vaccination 06/29/2023   BMI 40.0-44.9, adult (HCC) 06/29/2023   Pain of both eyes 06/29/2023   Encounter for dental examination 06/29/2023   Screening for STD (sexually transmitted disease) 06/29/2023   Pain in left knee 04/04/2023   Morbid obesity (HCC) 02/15/2023   Abdominal pain 10/20/2020   Vitamin D deficiency 06/22/2019   Chronic nonintractable headache 03/12/2019   Edema 03/12/2019   Essential hypertension 02/12/2019    Past Medical History:  Diagnosis Date   Acute appendicitis 02/20/2014   Cholelithiasis 02/21/2014   Endometrial polyp    History of diverticulitis of colon 02/2017   History of ectopic pregnancy    Hypertension    PMB (postmenopausal bleeding)    Swelling of both ankles 02/12/2019   Thickened endometrium     Family History  Problem Relation Age of Onset   Healthy Mother    Healthy Father    Breast cancer Neg Hx     Past Surgical History:  Procedure Laterality Date   CHOLECYSTECTOMY N/A 10/21/2020   Procedure: LAPAROSCOPIC CHOLECYSTECTOMY;  Surgeon: Andria Meuse, MD;  Location: [facility] OR;  Service: General;  Laterality: N/A;   DILATATION & CURETTAGE/HYSTEROSCOPY WITH MYOSURE N/A 06/04/2019   Procedure: DILATATION & CURETTAGE/HYSTEROSCOPY WITH MYOSURE;  Surgeon: Romualdo Bolk, MD;  Location: [facility] ;  Service: Gynecology;  Laterality: N/A;  follow previous case   ECTOPIC PREGNANCY SURGERY  early 2000s  in [place]   LAPAROSCOPIC APPENDECTOMY N/A 02/20/2014   Procedure: APPENDECTOMY LAPAROSCOPIC;  Surgeon: Liz Malady, MD;  Location: [facility] OR;  Service: General;  Laterality: N/A;   LYSIS OF ADHESION N/A 10/21/2020   Procedure: LYSIS OF ADHESION;  Surgeon: Andria Meuse, MD;  Location: [facility] OR;  Service: General;  Laterality: N/A;   Social History   Occupational History   Not on file  Tobacco Use   Smoking status: Never   Smokeless tobacco: Never  Vaping Use   Vaping status: Never Used  Substance and Sexual Activity   Alcohol use: No   Drug use: Never   Sexual activity: Not Currently    Birth control/protection: None

## 2024-01-19 ENCOUNTER — Ambulatory Visit: Payer: 59 | Admitting: Orthopaedic Surgery

## 2024-01-19 ENCOUNTER — Encounter: Payer: Self-pay | Admitting: Orthopaedic Surgery

## 2024-01-19 DIAGNOSIS — S83242A Other tear of medial meniscus, current injury, left knee, initial encounter: Secondary | ICD-10-CM | POA: Insufficient documentation

## 2024-01-19 DIAGNOSIS — M228X2 Other disorders of patella, left knee: Secondary | ICD-10-CM | POA: Insufficient documentation

## 2024-01-19 NOTE — Progress Notes (Signed)
 Office Visit Note   Patient: Margaret Singleton           Date of Birth: 19-Aug-1971           MRN: [id number] Visit Date: 01/19/2024              Requested by: Arnette Felts, FNP [address] [place],  [place] [address] PCP: Arnette Felts, FNP   Assessment & Plan: Visit Diagnoses:  1. Maltracking of left patella   2. 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  MRI reviewed and findings discussed with the patient.  Treatment options were offered and she has elected to move forward with a left knee scope evaluation of the medial meniscus and lateral release.  We discussed that in order to maximize the benefit from this procedure she would need physical therapy and weight loss as well.  Follow-Up Instructions: No follow-ups on file.   Orders:  No orders of the defined types were placed in this encounter.  No orders of the defined types were placed in this encounter.     Procedures: No procedures performed   Clinical Data: No additional findings.   Subjective: Chief Complaint  Patient presents with   Left Knee - Pain    HPI Margaret Singleton is a 53 year old female referral from [facility] for chronic left knee pain.  She has had knee pain for about a year.  Reports diffuse knee pain that is sometimes worse in the posterior medial aspect.  Denies any mechanical symptoms.  She has had 2 cortisone injections that provide temporary relief. Review of Systems  Constitutional: Negative.   HENT: Negative.    Eyes: Negative.   Respiratory: Negative.    Cardiovascular: Negative.   Endocrine: Negative.   Musculoskeletal: Negative.   Neurological: Negative.   Hematological: Negative.   Psychiatric/Behavioral: Negative.    All other systems reviewed and are negative.    Objective: Vital Signs: LMP 03/30/2019    Physical Exam Vitals and nursing note reviewed.  Constitutional:      Appearance: She is well-developed.  HENT:     Head: Normocephalic and atraumatic.  Pulmonary:     Effort: Pulmonary effort is normal.  Abdominal:     Palpations: Abdomen is soft.  Musculoskeletal:     Cervical back: Neck supple.  Skin:    General: Skin is warm.     Capillary Refill: Capillary refill takes less than 2 seconds.  Neurological:     Mental Status: She is alert and oriented to person, place, and time.  Psychiatric:        Behavior: Behavior normal.        Thought Content: Thought content normal.        Judgment: Judgment normal.     Ortho Exam Examination of left knee shows laterally tracking patella and he eversion.  Pain along the lateral patellofemoral facet.  She has pain towards the posterior medial aspect of the knee.  She has diffuse global pain with range of motion.  No joint effusion.  Collaterals and cruciates are stable. Specialty Comments:  No specialty comments available.  Imaging: No results found.   PMFS History: Patient Active Problem List   Diagnosis Date Noted   Maltracking of left patella 01/19/2024   Acute medial meniscus tear of left knee 01/19/2024   Impacted cerumen of right  ear 08/25/2023   Encounter for annual health examination 06/29/2023   Encounter for Papanicolaou smear of cervix 06/29/2023   Need for Tdap vaccination 06/29/2023   BMI 40.0-44.9, adult (HCC) 06/29/2023   Pain of both eyes 06/29/2023   Encounter for dental examination 06/29/2023   Screening for STD (sexually transmitted disease) 06/29/2023   Pain in left knee 04/04/2023   Morbid obesity (HCC) 02/15/2023   Abdominal pain 10/20/2020   Vitamin D deficiency 06/22/2019   Chronic nonintractable headache 03/12/2019   Edema 03/12/2019   Essential hypertension 02/12/2019   Past Medical History:  Diagnosis Date   Acute appendicitis 02/20/2014   Cholelithiasis 02/21/2014   Endometrial polyp     History of diverticulitis of colon 02/2017   History of ectopic pregnancy    Hypertension    PMB (postmenopausal bleeding)    Swelling of both ankles 02/12/2019   Thickened endometrium     Family History  Problem Relation Age of Onset   Healthy Mother    Healthy Father    Breast cancer Neg Hx     Past Surgical History:  Procedure Laterality Date   CHOLECYSTECTOMY N/A 10/21/2020   Procedure: LAPAROSCOPIC CHOLECYSTECTOMY;  Surgeon: Andria Meuse, MD;  Location: [facility] OR;  Service: General;  Laterality: N/A;   DILATATION & CURETTAGE/HYSTEROSCOPY WITH MYOSURE N/A 06/04/2019   Procedure: DILATATION & CURETTAGE/HYSTEROSCOPY WITH MYOSURE;  Surgeon: Romualdo Bolk, MD;  Location: [facility] [place];  Service: Gynecology;  Laterality: N/A;  follow previous case   ECTOPIC PREGNANCY SURGERY  early 2000s  in [place]   LAPAROSCOPIC APPENDECTOMY N/A 02/20/2014   Procedure: APPENDECTOMY LAPAROSCOPIC;  Surgeon: Liz Malady, MD;  Location: [facility] OR;  Service: General;  Laterality: N/A;   LYSIS OF ADHESION N/A 10/21/2020   Procedure: LYSIS OF ADHESION;  Surgeon: Andria Meuse, MD;  Location: [facility] OR;  Service: General;  Laterality: N/A;   Social History   Occupational History   Not on file  Tobacco Use   Smoking status: Never   Smokeless tobacco: Never  Vaping Use   Vaping status: Never Used  Substance and Sexual Activity   Alcohol use: No   Drug use: Never   Sexual activity: Not Currently    Birth control/protection: None

## 2024-02-01 ENCOUNTER — Other Ambulatory Visit: Payer: Self-pay | Admitting: Physician Assistant

## 2024-02-01 MED ORDER — ONDANSETRON HCL 4 MG PO TABS: 4.0000 mg | ORAL_TABLET | Freq: Three times a day (TID) | ORAL | 0 refills | Status: AC | PRN

## 2024-02-01 MED ORDER — HYDROCODONE-ACETAMINOPHEN 5-325 MG PO TABS: 1.0000 | ORAL_TABLET | Freq: Three times a day (TID) | ORAL | 0 refills | Status: AC | PRN

## 2024-02-02 ENCOUNTER — Encounter (HOSPITAL_BASED_OUTPATIENT_CLINIC_OR_DEPARTMENT_OTHER): Payer: Self-pay | Admitting: Orthopaedic Surgery

## 2024-02-02 NOTE — Progress Notes (Signed)
   02/02/24 1059  PAT Phone Screen  Is the patient taking a GLP-1 receptor agonist? No  Do You Have Diabetes? No  Do You Have Hypertension? (S)  Yes  Have You Ever Been to the ER for Asthma? No  Have You Taken Oral Steroids in the Past 3 Months? No  Do you Take Phenteramine or any Other Diet Drugs? No  Recent  Lab Work, EKG, CXR? (S)  Yes  Where was this test performed? (S)  EKG 06/20/23  Do you have a history of heart problems? No  Any Recent Hospitalizations? No  Height 4\' 9"  (1.448 m)  Weight 87.6 kg  Pat Appointment Scheduled (S)  Yes (for BP, ht and wt check. Pt d/c htn meds per provider, but unable to find note)

## 2024-02-08 ENCOUNTER — Ambulatory Visit (HOSPITAL_BASED_OUTPATIENT_CLINIC_OR_DEPARTMENT_OTHER): Admitting: Anesthesiology

## 2024-02-08 ENCOUNTER — Ambulatory Visit (HOSPITAL_BASED_OUTPATIENT_CLINIC_OR_DEPARTMENT_OTHER)
Admission: RE | Admit: 2024-02-08 | Discharge: 2024-02-08 | Disposition: A | Discharge status: Home / Self Care | Payer: 59 | Attending: Orthopaedic Surgery | Admitting: Orthopaedic Surgery

## 2024-02-08 ENCOUNTER — Encounter (HOSPITAL_BASED_OUTPATIENT_CLINIC_OR_DEPARTMENT_OTHER)
Admission: RE | Disposition: A | Discharge status: Home / Self Care | Payer: Self-pay | Source: Home / Self Care | Attending: Orthopaedic Surgery

## 2024-02-08 ENCOUNTER — Encounter (HOSPITAL_BASED_OUTPATIENT_CLINIC_OR_DEPARTMENT_OTHER): Payer: Self-pay | Admitting: Orthopaedic Surgery

## 2024-02-08 ENCOUNTER — Encounter: Payer: Self-pay | Admitting: Orthopaedic Surgery

## 2024-02-08 ENCOUNTER — Other Ambulatory Visit: Payer: Self-pay

## 2024-02-08 DIAGNOSIS — X58XXXA Exposure to other specified factors, initial encounter: Secondary | ICD-10-CM | POA: Diagnosis not present

## 2024-02-08 DIAGNOSIS — M222X2 Patellofemoral disorders, left knee: Secondary | ICD-10-CM | POA: Diagnosis not present

## 2024-02-08 DIAGNOSIS — S83242D Other tear of medial meniscus, current injury, left knee, subsequent encounter: Secondary | ICD-10-CM | POA: Diagnosis not present

## 2024-02-08 DIAGNOSIS — I10 Essential (primary) hypertension: Secondary | ICD-10-CM | POA: Insufficient documentation

## 2024-02-08 DIAGNOSIS — Z79899 Other long term (current) drug therapy: Secondary | ICD-10-CM | POA: Diagnosis not present

## 2024-02-08 DIAGNOSIS — Z6839 Body mass index (BMI) 39.0-39.9, adult: Secondary | ICD-10-CM | POA: Insufficient documentation

## 2024-02-08 DIAGNOSIS — R519 Headache, unspecified: Secondary | ICD-10-CM | POA: Diagnosis not present

## 2024-02-08 DIAGNOSIS — E66813 Obesity, class 3: Secondary | ICD-10-CM | POA: Insufficient documentation

## 2024-02-08 DIAGNOSIS — M228X2 Other disorders of patella, left knee: Secondary | ICD-10-CM | POA: Diagnosis present

## 2024-02-08 DIAGNOSIS — S83242A Other tear of medial meniscus, current injury, left knee, initial encounter: Secondary | ICD-10-CM | POA: Diagnosis present

## 2024-02-08 HISTORY — PX: KNEE ARTHROSCOPY WITH LATERAL RELEASE: SHX5649

## 2024-02-08 HISTORY — PX: KNEE ARTHROSCOPY WITH MEDIAL MENISECTOMY: SHX5651

## 2024-02-08 SURGERY — ARTHROSCOPY, KNEE, WITH MEDIAL MENISCECTOMY
Anesthesia: General | Site: Knee | Laterality: Left

## 2024-02-08 MED ORDER — BUPIVACAINE-EPINEPHRINE (PF) 0.25% -1:200000 IJ SOLN
INTRAMUSCULAR | Status: AC
  Filled 2024-02-08: qty 30

## 2024-02-08 MED ORDER — EPHEDRINE 5 MG/ML INJ
INTRAVENOUS | Status: AC
  Filled 2024-02-08: qty 5

## 2024-02-08 MED ORDER — ACETAMINOPHEN 500 MG PO TABS
1000.0000 mg | ORAL_TABLET | Freq: Once | ORAL | Status: AC
  Administered 2024-02-08: 1000 mg via ORAL

## 2024-02-08 MED ORDER — ACETAMINOPHEN 500 MG PO TABS
ORAL_TABLET | ORAL | Status: AC
  Filled 2024-02-08: qty 2

## 2024-02-08 MED ORDER — LIDOCAINE HCL (CARDIAC) PF 100 MG/5ML IV SOSY
PREFILLED_SYRINGE | INTRAVENOUS | Status: DC | PRN
  Administered 2024-02-08: 80 mg via INTRAVENOUS

## 2024-02-08 MED ORDER — FENTANYL CITRATE (PF) 100 MCG/2ML IJ SOLN: INTRAMUSCULAR | Status: DC | PRN

## 2024-02-08 MED ORDER — MIDAZOLAM HCL 5 MG/5ML IJ SOLN
INTRAMUSCULAR | Status: DC | PRN
  Administered 2024-02-08: 2 mg via INTRAVENOUS

## 2024-02-08 MED ORDER — FENTANYL CITRATE (PF) 100 MCG/2ML IJ SOLN
INTRAMUSCULAR | Status: AC
Start: 2024-02-08 — End: ?
  Filled 2024-02-08: qty 2

## 2024-02-08 MED ORDER — CEFAZOLIN SODIUM-DEXTROSE 2-4 GM/100ML-% IV SOLN
INTRAVENOUS | Status: AC
  Filled 2024-02-08: qty 100

## 2024-02-08 MED ORDER — LIDOCAINE 2% (20 MG/ML) 5 ML SYRINGE
INTRAMUSCULAR | Status: AC
  Filled 2024-02-08: qty 5

## 2024-02-08 MED ORDER — MIDAZOLAM HCL 2 MG/2ML IJ SOLN
INTRAMUSCULAR | Status: AC
  Filled 2024-02-08: qty 2

## 2024-02-08 MED ORDER — DEXAMETHASONE SODIUM PHOSPHATE 4 MG/ML IJ SOLN
INTRAMUSCULAR | Status: DC | PRN
  Administered 2024-02-08: 5 mg via INTRAVENOUS

## 2024-02-08 MED ORDER — CEFAZOLIN SODIUM-DEXTROSE 2-4 GM/100ML-% IV SOLN
2.0000 g | INTRAVENOUS | Status: AC
  Administered 2024-02-08: 2 g via INTRAVENOUS

## 2024-02-08 MED ORDER — PHENYLEPHRINE 80 MCG/ML (10ML) SYRINGE FOR IV PUSH (FOR BLOOD PRESSURE SUPPORT)
PREFILLED_SYRINGE | INTRAVENOUS | Status: AC
  Filled 2024-02-08: qty 10

## 2024-02-08 MED ORDER — SODIUM CHLORIDE 0.9 % IR SOLN
Status: DC | PRN
  Administered 2024-02-08: 3000 mL

## 2024-02-08 MED ORDER — LACTATED RINGERS IV SOLN: INTRAVENOUS | Status: DC

## 2024-02-08 MED ORDER — FENTANYL CITRATE (PF) 100 MCG/2ML IJ SOLN
INTRAMUSCULAR | Status: AC
  Filled 2024-02-08: qty 2

## 2024-02-08 MED ORDER — ONDANSETRON HCL 4 MG/2ML IJ SOLN
INTRAMUSCULAR | Status: AC
  Filled 2024-02-08: qty 2

## 2024-02-08 MED ORDER — FENTANYL CITRATE (PF) 100 MCG/2ML IJ SOLN
INTRAMUSCULAR | Status: DC | PRN
  Administered 2024-02-08 (×2): 50 ug via INTRAVENOUS

## 2024-02-08 MED ORDER — BUPIVACAINE HCL (PF) 0.25 % IJ SOLN
INTRAMUSCULAR | Status: AC
  Filled 2024-02-08: qty 30

## 2024-02-08 MED ORDER — KETOROLAC TROMETHAMINE 30 MG/ML IJ SOLN
INTRAMUSCULAR | Status: DC | PRN
  Administered 2024-02-08: 30 mg via INTRAVENOUS

## 2024-02-08 MED ORDER — BUPIVACAINE HCL (PF) 0.25 % IJ SOLN
INTRAMUSCULAR | Status: DC | PRN
  Administered 2024-02-08: 20 mL

## 2024-02-08 MED ORDER — SUCCINYLCHOLINE CHLORIDE 200 MG/10ML IV SOSY
PREFILLED_SYRINGE | INTRAVENOUS | Status: AC
  Filled 2024-02-08: qty 10

## 2024-02-08 MED ORDER — ONDANSETRON HCL 4 MG/2ML IJ SOLN
INTRAMUSCULAR | Status: DC | PRN
  Administered 2024-02-08: 4 mg via INTRAVENOUS

## 2024-02-08 MED ORDER — AMISULPRIDE (ANTIEMETIC) 5 MG/2ML IV SOLN: 10.0000 mg | Freq: Once | INTRAVENOUS | Status: DC | PRN

## 2024-02-08 MED ORDER — PROPOFOL 10 MG/ML IV BOLUS
INTRAVENOUS | Status: DC | PRN
  Administered 2024-02-08: 300 mg via INTRAVENOUS

## 2024-02-08 MED ORDER — FENTANYL CITRATE (PF) 100 MCG/2ML IJ SOLN
25.0000 ug | INTRAMUSCULAR | Status: DC | PRN
  Administered 2024-02-08 (×2): 25 ug via INTRAVENOUS
  Administered 2024-02-08: 50 ug via INTRAVENOUS

## 2024-02-08 SURGICAL SUPPLY — 44 items
BANDAGE ESMARK 6X9 LF (GAUZE/BANDAGES/DRESSINGS) IMPLANT
BLADE EXCALIBUR 4.0X13 (MISCELLANEOUS) IMPLANT
BLADE SHAVER TORPEDO 4X13 (MISCELLANEOUS) ×1 IMPLANT
BNDG ELASTIC 4INX 5YD STR LF (GAUZE/BANDAGES/DRESSINGS) IMPLANT
BNDG ELASTIC 6INX 5YD STR LF (GAUZE/BANDAGES/DRESSINGS) ×2 IMPLANT
BNDG ESMARK 6X9 LF (GAUZE/BANDAGES/DRESSINGS) ×1 IMPLANT
BURR OVAL 8 FLU 4.0X13 (MISCELLANEOUS) IMPLANT
COOLER ICEMAN CLASSIC (MISCELLANEOUS) ×1 IMPLANT
CUFF TRNQT CYL 34X4.125X (TOURNIQUET CUFF) ×1 IMPLANT
CUTTER BONE 4.0MM X 13CM (MISCELLANEOUS) IMPLANT
DISSECTOR 3.5MM X 13CM CVD (MISCELLANEOUS) IMPLANT
DRAPE ARTHROSCOPY W/POUCH 90 (DRAPES) ×1 IMPLANT
DRAPE IMP U-DRAPE 54X76 (DRAPES) ×1 IMPLANT
DRAPE U-SHAPE 47X51 STRL (DRAPES) ×1 IMPLANT
DURAPREP 26ML APPLICATOR (WOUND CARE) ×2 IMPLANT
ELECT REM PT RETURN 9FT ADLT (ELECTROSURGICAL) IMPLANT
ELECTRODE REM PT RTRN 9FT ADLT (ELECTROSURGICAL) ×1 IMPLANT
EXCALIBUR 3.8MM X 13CM (MISCELLANEOUS) IMPLANT
GAUZE SPONGE 4X4 12PLY STRL (GAUZE/BANDAGES/DRESSINGS) ×1 IMPLANT
GAUZE XEROFORM 1X8 LF (GAUZE/BANDAGES/DRESSINGS) ×1 IMPLANT
GLOVE BIO SURGEON STRL SZ 6.5 (GLOVE) IMPLANT
GLOVE BIOGEL PI IND STRL 7.0 (GLOVE) IMPLANT
GLOVE BIOGEL PI IND STRL 7.5 (GLOVE) ×1 IMPLANT
GLOVE ECLIPSE 7.0 STRL STRAW (GLOVE) ×2 IMPLANT
GLOVE INDICATOR 7.0 STRL GRN (GLOVE) ×2 IMPLANT
GLOVE SURG SYN 7.5 E (GLOVE) ×2 IMPLANT
GLOVE SURG SYN 7.5 PF PI (GLOVE) ×2 IMPLANT
GOWN STRL REUS W/ TWL LRG LVL3 (GOWN DISPOSABLE) ×1 IMPLANT
GOWN STRL REUS W/ TWL XL LVL3 (GOWN DISPOSABLE) ×1 IMPLANT
GOWN STRL SURGICAL XL XLNG (GOWN DISPOSABLE) ×2 IMPLANT
MANIFOLD NEPTUNE II (INSTRUMENTS) ×1 IMPLANT
PACK ARTHROSCOPY DSU (CUSTOM PROCEDURE TRAY) ×1 IMPLANT
PACK BASIN DAY SURGERY FS (CUSTOM PROCEDURE TRAY) ×1 IMPLANT
PAD COLD SHLDR WRAP-ON (PAD) ×1 IMPLANT
PENCIL SMOKE EVACUATOR (MISCELLANEOUS) ×1 IMPLANT
PROBE HOOK APOLLO (SURGICAL WAND) IMPLANT
RESECTOR TORPEDO 4MM 13CM CVD (MISCELLANEOUS) IMPLANT
SHEET MEDIUM DRAPE 40X70 STRL (DRAPES) ×1 IMPLANT
SLEEVE SCD COMPRESS KNEE MED (STOCKING) ×1 IMPLANT
SUT ETHILON 3 0 PS 1 (SUTURE) ×1 IMPLANT
TOWEL GREEN STERILE FF (TOWEL DISPOSABLE) ×1 IMPLANT
TUBING ARTHROSCOPY IRRIG 16FT (MISCELLANEOUS) ×1 IMPLANT
WAND ABLATOR APOLLO I90 (BUR) IMPLANT
WATER STERILE IRR 1000ML POUR (IV SOLUTION) ×1 IMPLANT

## 2024-02-08 NOTE — Transfer of Care (Signed)
 Immediate Anesthesia Transfer of Care Note  Patient: Margaret Singleton  Procedure(s) Performed: LEFT KNEE ARTHROSCOPY WITH PARTIAL MEDIAL MENISECTOMY (Left: Knee) LATERAL RELEASE (Left: Knee)  Patient Location: PACU  Anesthesia Type:General  Level of Consciousness: drowsy and patient cooperative  Airway & Oxygen Therapy: Patient Spontanous Breathing and Patient connected to face mask oxygen  Post-op Assessment: Report given to RN and Post -op Vital signs reviewed and stable  Post vital signs: Reviewed and stable  Last Vitals:  Vitals Value Taken Time  BP    Temp    Pulse    Resp    SpO2      Last Pain:  Vitals:   02/08/24 1032  TempSrc: Oral  PainSc: 2          Complications: No notable events documented.

## 2024-02-08 NOTE — Anesthesia Preprocedure Evaluation (Addendum)
 Anesthesia Evaluation  Patient identified by MRN, date of birth, ID band Patient awake    Reviewed: Allergy & Precautions, NPO status , Patient's Chart, lab work & pertinent test results  Airway Mallampati: II  TM Distance: >3 FB Neck ROM: Full    Dental no notable dental hx.    Pulmonary neg pulmonary ROS   Pulmonary exam normal        Cardiovascular hypertension, Pt. on medications  Rhythm:Regular Rate:Normal     Neuro/Psych  Headaches  negative psych ROS   GI/Hepatic negative GI ROS,,,(+)     substance abuse    Endo/Other    Class 3 obesity (BMI 40)  Renal/GU negative Renal ROS  negative genitourinary   Musculoskeletal negative musculoskeletal ROS (+)  narcotic dependent  Abdominal Normal abdominal exam  (+)   Peds  Hematology negative hematology ROS (+)   Anesthesia Other Findings   Reproductive/Obstetrics                             Anesthesia Physical Anesthesia Plan  ASA: 2  Anesthesia Plan: General   Post-op Pain Management: Tylenol PO (pre-op)*   Induction: Intravenous  PONV Risk Score and Plan: 3 and Ondansetron, Dexamethasone and Midazolam  Airway Management Planned: LMA  Additional Equipment:   Intra-op Plan:   Post-operative Plan: Extubation in OR  Informed Consent: I have reviewed the patients History and Physical, chart, labs and discussed the procedure including the risks, benefits and alternatives for the proposed anesthesia with the patient or authorized representative who has indicated his/her understanding and acceptance.     Dental advisory given  Plan Discussed with: CRNA  Anesthesia Plan Comments:        Anesthesia Quick Evaluation

## 2024-02-08 NOTE — H&P (Signed)
 PREOPERATIVE H&P  Chief Complaint: left patellofemoral dysfunction, maltracking, medial meniscal tear  HPI: Margaret Singleton is a 53 y.o. female who presents for surgical treatment of left patellofemoral dysfunction, maltracking, medial meniscal tear.  She denies any changes in medical history.  Past Surgical History:  Procedure Laterality Date   CHOLECYSTECTOMY N/A 10/21/2020   Procedure: LAPAROSCOPIC CHOLECYSTECTOMY;  Surgeon: Andria Meuse, MD;  Location: [facility] OR;  Service: General;  Laterality: N/A;   DILATATION & CURETTAGE/HYSTEROSCOPY WITH MYOSURE N/A 06/04/2019   Procedure: DILATATION & CURETTAGE/HYSTEROSCOPY WITH MYOSURE;  Surgeon: Romualdo Bolk, MD;  Location: [facility];  Service: Gynecology;  Laterality: N/A;  follow previous case   ECTOPIC PREGNANCY SURGERY  early 2000s  in [place]   LAPAROSCOPIC APPENDECTOMY N/A 02/20/2014   Procedure: APPENDECTOMY LAPAROSCOPIC;  Surgeon: Liz Malady, MD;  Location[facility] OR;  Service: General;  Laterality: N/A;   LYSIS OF ADHESION N/A 10/21/2020   Procedure: LYSIS OF ADHESION;  Surgeon: Andria Meuse, MD;  Location: [facility] OR;  Service: General;  Laterality: N/A;   Social History   Socioeconomic History   Marital status: Married    Spouse name: Not on file   Number of children: Not on file   Years of education: Not on file   Highest education level: Not on file  Occupational History   Not on file  Tobacco Use   Smoking status: Never   Smokeless tobacco: Never  Vaping Use   Vaping status: Never Used  Substance and Sexual Activity   Alcohol use: No   Drug use: Never   Sexual activity: Not Currently    Birth control/protection: None  Other Topics Concern   Not on file  Social History Narrative   ** Merged History Encounter **       Social Drivers of Corporate investment banker Strain: Not on file  Food Insecurity: Not on file  Transportation Needs: Not on file  Physical Activity: Not on  file  Stress: Not on file  Social Connections: Not on file   Family History  Problem Relation Age of Onset   Healthy Mother    Healthy Father    Breast cancer Neg Hx    Allergies  Allergen Reactions   Oxycodone Itching   Prior to Admission medications   Medication Sig Start Date End Date Taking? Authorizing Provider  HYDROcodone-acetaminophen (NORCO/VICODIN) 5-325 MG tablet Take 1 tablet by mouth 3 (three) times daily as needed for moderate pain (pain score 4-6). To be taken after surgery 02/01/24  Yes Jari Sportsman L, PA-C  ondansetron (ZOFRAN) 4 MG tablet Take 1 tablet (4 mg total) by mouth every 8 (eight) hours as needed for nausea or vomiting. 02/01/24   Cristie Hem, PA-C  clindamycin (CLINDAGEL) 1 % gel Apply topically 2 (two) times daily. 02/25/22   Arnette Felts, FNP  hydrochlorothiazide (HYDRODIURIL) 12.5 MG tablet Take 1 tablet by mouth daily as needed for leg swelling 02/16/23   Arnette Felts, FNP  meloxicam (MOBIC) 15 MG tablet TAKE 1 TABLET(15 MG) BY MOUTH DAILY FOR 5 DAYS THEN AS NEEDED 08/24/23   Arnette Felts, FNP  Vitamin D, Ergocalciferol, (DRISDOL) 1.25 MG (50000 UNIT) CAPS capsule Take 1 capsule (50,000 Units total) by mouth 2 (two) times a week. 02/17/23   Arnette Felts, FNP     Positive ROS: All other systems have been reviewed and were otherwise negative with the exception of those mentioned in the HPI and as above.  Physical  Exam: General: Alert, no acute distress Cardiovascular: No pedal edema Respiratory: No cyanosis, no use of accessory musculature GI: abdomen soft Skin: No lesions in the area of chief complaint Neurologic: Sensation intact distally Psychiatric: Patient is competent for consent with normal mood and affect Lymphatic: no lymphedema  MUSCULOSKELETAL: exam stable  Assessment: left patellofemoral dysfunction, maltracking, medial meniscal tear  Plan: Plan for Procedure(s): KNEE ARTHROSCOPY WITH PARTIAL MEDIAL MENISECTOMY LATERAL  RELEASE  The risks benefits and alternatives were discussed with the patient including but not limited to the risks of nonoperative treatment, versus surgical intervention including infection, bleeding, nerve injury,  blood clots, cardiopulmonary complications, morbidity, mortality, among others, and they were willing to proceed.   Glee Arvin, MD 02/08/2024 10:08 AM

## 2024-02-08 NOTE — Op Note (Signed)
   Surgery Date: 02/08/2024  PREOPERATIVE DIAGNOSES:  1. Left knee medial meniscus tear 2. Left patellar maltracking  POSTOPERATIVE DIAGNOSES:  same  PROCEDURES PERFORMED:  1. Left knee arthroscopy with limited synovectomy 2. Left knee arthroscopy with arthroscopic partial medial meniscectomy 3. Left knee arthroscopy with arthroscopic lateral release  SURGEON: N. Glee Arvin, M.D.  ASSIST: Starlyn Skeans [place], [place]; necessary for the timely completion of procedure and due to complexity of procedure.  ANESTHESIA:  general  FLUIDS: Per anesthesia record.   ESTIMATED BLOOD LOSS: minimal  DESCRIPTION OF PROCEDURE: Ms. Rausch is a 53 y.o.-year-old female with above mentioned conditions. Full discussion held regarding risks benefits alternatives and complications related surgical intervention. Conservative care options reviewed. All questions answered.  The patient was identified in the preoperative holding area and the operative extremity was marked. The patient was brought to the operating room and transferred to operating table in a supine position. Satisfactory general anesthesia was induced by anesthesiology.    Standard anterolateral, anteromedial arthroscopy portals were obtained. The anteromedial portal was obtained with a spinal needle for localization under direct visualization with subsequent diagnostic findings.   The fat pad was quite extensive and inflamed consistent with patellofemoral impingement syndrome.  The fat pad was debrided away with the oscillating shaver.  The cruciates were evaluated and were unremarkable.  Valgus force was placed on the knee.  There is no chondromalacia of the medial compartment.  There was a partially healed radial tear of the medial meniscal root.  This was debrided back to stable margins with shaver.  The rest of the medial meniscus was unremarkable.  The lateral compartment was unremarkable.  Again there was a fair amount of inflamed fat pad  that was resected.  The knee was then placed into extension.  The chondral surfaces were unremarkable.  There were no loose bodies in the gutters.  The patella did track significantly laterally.  Lateral release was then performed with an ArthroCare wand extending from the superior border of the patella to the inferior border.  The release allowed adequate medial mobilization of the patella.  This also improved tracking.  Gutters were checked for loose bodies.  Excess fluid was removed from the knee joint.  Incisions were closed with interrupted nylon sutures.  Sterile dressings were applied.  Patient tolerated procedure well had no immediate complications.  Suprapatellar pouch and gutters: no synovitis or debris. Patella chondral surface: Grade 0 Trochlear chondral surface: Grade 0 Patellofemoral tracking: lateral Medial meniscus: partially healed meniscal root tear.  Medial femoral condyle weight bearing surface: Grade 0 Medial tibial plateau: Grade 0 Anterior cruciate ligament:stable Posterior cruciate ligament:stable Lateral meniscus: normal.   Lateral femoral condyle weight bearing surface: Grade 0 Lateral tibial plateau: Grade 0  DISPOSITION: The patient was awakened from general anesthetic, extubated, taken to the recovery room in medically stable condition, no apparent complications. The patient may be weightbearing as tolerated to the operative lower extremity.  Range of motion of right knee as tolerated.  Mayra Reel, MD [facility] 1:09 PM

## 2024-02-08 NOTE — Anesthesia Procedure Notes (Signed)
 Procedure Name: LMA Insertion Date/Time: 02/08/2024 12:38 PM  Performed by: Ronnette Hila, CRNAPre-anesthesia Checklist: Patient identified, Emergency Drugs available, Suction available and Patient being monitored Patient Re-evaluated:Patient Re-evaluated prior to induction Oxygen Delivery Method: Circle system utilized Preoxygenation: Pre-oxygenation with 100% oxygen Induction Type: IV induction Ventilation: Mask ventilation without difficulty LMA: LMA inserted LMA Size: 4.0 Number of attempts: 1 Airway Equipment and Method: Bite block Placement Confirmation: positive ETCO2 Tube secured with: Tape Dental Injury: Teeth and Oropharynx as per pre-operative assessment

## 2024-02-08 NOTE — Discharge Instructions (Addendum)
 Post-operative patient instructions  Knee Arthroscopy   Ice:  Place intermittent ice or cooler pack over your knee, 30 minutes on and 30 minutes off.  Continue this for the first 72 hours after surgery, then save ice for use after therapy sessions or on more active days.   Weight:  You may bear weight on your leg as your symptoms allow. Crutches:  Use crutches (or Schmiesing) to assist in walking until told to discontinue by your physical therapist or physician. This will help to reduce pain. Strengthening:  Perform simple thigh squeezes (isometric quad contractions) and straight leg lifts as you are able (3 sets of 5 to 10 repetitions, 3 times a day).  For the leg lifts, have someone support under your ankle in the beginning until you have increased strength enough to do this on your own.  To help get started on thigh squeezes, place a pillow under your knee and push down on the pillow with back of knee (sometimes easier to do than with your leg fully straight). Motion:  Perform gentle knee motion as tolerated - this is gentle bending and straightening of the knee. Seated heel slides: you can start by sitting in a chair, remove your brace, and gently slide your heel back on the floor - allowing your knee to bend. Have someone help you straighten your knee (or use your other leg/foot hooked under your ankle.  Dressing:  Perform 1st dressing change at 2 days postoperative. A moderate amount of blood tinged drainage is to be expected.  So if you bleed through the dressing on the first or second day or if you have fevers, it is fine to change the dressing/check the wounds early and redress wound. Elevate your leg.  If it bleeds through again, or if the incisions are leaking frank blood, please call the office. May change dressing every 1-2 days thereafter to help watch wounds. You may place regular band-aids over the incisions.   Shower:  Light shower is ok after 2 days.  Please take shower, NO bath. Recover  with gauze and ace wrap to help keep wounds protected.   Pain medication:  A narcotic pain medication has been prescribed.  Take as directed.  Typically you need narcotic pain medication more regularly during the first 3 to 5 days after surgery.  Decrease your use of the medication as the pain improves.  Narcotics can sometimes cause constipation, even after a few doses.  If you have problems with constipation, you can take an over the counter stool softener or light laxative.  If you have persistent problems, please notify your physician's office. Physical therapy: Additional activity guidelines to be provided by your physician or physical therapist at follow-up visits.  Driving: Do not recommend driving x 1 week post surgical, especially if surgery performed on right side. Should not drive while taking narcotic pain medications. It typically takes at least 1 week to restore sufficient neuromuscular function for normal reaction times for driving safety.  Call [phone] for questions or problems. Evenings you will be forwarded to the hospital operator.  Ask for the orthopaedic physician on call. Please call if you experience:    Redness, foul smelling, or persistent drainage from the surgical site  worsening knee pain and swelling not responsive to medication  any calf pain and or swelling of the lower leg  temperatures greater than 101.5 F other questions or concerns  Per [facility] clinic policy, our goal is ensure optimal postoperative pain control  with a multimodal pain management strategy. For all [facility] patients, our goal is to wean post-operative narcotic medications by 6 weeks post-operatively. If this is not possible due to utilization of pain medication prior to surgery, your [facility] doctor will support your acute post-operative pain control for the first 6 weeks postoperatively, with a plan to transition you back to your primary pain team following that. Cyndia Skeeters will work to ensure a  Therapist, occupational.    Thank you for allowing [place] to be a part of your care.   No Tylenol until 6:00pm today.

## 2024-02-08 NOTE — Anesthesia Postprocedure Evaluation (Signed)
 Anesthesia Post Note  Patient: Margaret Singleton  Procedure(s) Performed: LEFT KNEE ARTHROSCOPY WITH PARTIAL MEDIAL MENISECTOMY AND LIMITED SYNOVECTOMY (Left: Knee) LATERAL RELEASE (Left: Knee)     Patient location during evaluation: PACU Anesthesia Type: General Level of consciousness: awake and alert Pain management: pain level controlled Vital Signs Assessment: post-procedure vital signs reviewed and stable Respiratory status: spontaneous breathing, nonlabored ventilation, respiratory function stable and patient connected to nasal cannula oxygen Cardiovascular status: blood pressure returned to baseline and stable Postop Assessment: no apparent nausea or vomiting Anesthetic complications: no   No notable events documented.  Last Vitals:  Vitals:   02/08/24 1445 02/08/24 1505  BP: (!) 144/78 (!) 149/93  Pulse: 71 73  Resp: 18 20  Temp:  36.8 C  SpO2: 96% 93%    Last Pain:  Vitals:   02/08/24 1505  TempSrc: Temporal  PainSc: 0-No pain                 Earl Lites P Hussam Muniz

## 2024-02-09 ENCOUNTER — Encounter (HOSPITAL_BASED_OUTPATIENT_CLINIC_OR_DEPARTMENT_OTHER): Payer: Self-pay | Admitting: Orthopaedic Surgery

## 2024-02-10 ENCOUNTER — Telehealth: Payer: Self-pay | Admitting: Orthopaedic Surgery

## 2024-02-10 ENCOUNTER — Other Ambulatory Visit: Payer: Self-pay | Admitting: Physician Assistant

## 2024-02-10 MED ORDER — TRAMADOL HCL 50 MG PO TABS: 50.0000 mg | ORAL_TABLET | Freq: Three times a day (TID) | ORAL | 0 refills | Status: AC | PRN

## 2024-02-10 NOTE — Telephone Encounter (Signed)
 No need for oppt right now. Recommend hep from d/c instructions.  Sent in tramadol to take instead of norco

## 2024-02-10 NOTE — Telephone Encounter (Signed)
 Pt daughter Steward Drone called stats medication making pt itch really bad and asking to switch meds. Also asking is physical therapy start. No one has contacted pt about it. Pt phone number is [phone]

## 2024-02-10 NOTE — Telephone Encounter (Signed)
 Spoke with patient and daughter

## 2024-02-15 ENCOUNTER — Ambulatory Visit (INDEPENDENT_AMBULATORY_CARE_PROVIDER_SITE_OTHER): Payer: 59 | Admitting: Physician Assistant

## 2024-02-15 ENCOUNTER — Encounter: Payer: Self-pay | Admitting: Orthopaedic Surgery

## 2024-02-15 DIAGNOSIS — Z9889 Other specified postprocedural states: Secondary | ICD-10-CM

## 2024-02-15 NOTE — Progress Notes (Signed)
 Post-Op Visit Note   Patient: Margaret Singleton           Date of Birth: [birth year]           MRN: [id number] Visit Date: 02/15/2024 PCP: Arnette Felts, FNP   Assessment & Plan:  Chief Complaint:  Chief Complaint  Patient presents with   Left Knee - Follow-up    Left knee scope 02/08/2024   Visit Diagnoses:  1. S/P left knee arthroscopy     Plan: Patient is a pleasant 53 year old female who comes in today 1 week status post left knee arthroscopic lateral release 02/08/2024.  She has been doing well.  She is not taking anything for pain.  Examination of the left knee reveals fully healed surgical portals without evidence of infection or cellulitis.  Calf is soft and nontender.  She is neurovascularly intact distally.  Today, sutures were removed and Steri-Strips applied.  Intraoperative pictures reviewed.  PT referral sent in.  PSA brace provided.  She will follow-up with [place] in 5 weeks for repeat evaluation.  Call with concerns or questions.  Follow-Up Instructions: Return in about 5 weeks (around 03/21/2024).   Orders:  Orders Placed This Encounter  Procedures   Ambulatory referral to Physical Therapy   No orders of the defined types were placed in this encounter.   Imaging:   PMFS History: Patient Active Problem List   Diagnosis Date Noted   Maltracking of left patella 01/19/2024   Acute medial meniscus tear of left knee 01/19/2024   Impacted cerumen of right ear 08/25/2023   Encounter for annual health examination 06/29/2023   Encounter for Papanicolaou smear of cervix 06/29/2023   Need for Tdap vaccination 06/29/2023   BMI 40.0-44.9, adult (HCC) 06/29/2023   Pain of both eyes 06/29/2023   Encounter for dental examination 06/29/2023   Screening for STD (sexually transmitted disease) 06/29/2023   Morbid obesity (HCC) 02/15/2023   Abdominal pain 10/20/2020   Vitamin D deficiency 06/22/2019   Chronic nonintractable headache 03/12/2019   Edema 03/12/2019   Essential  hypertension 02/12/2019   Past Medical History:  Diagnosis Date   Acute appendicitis 02/20/2014   Cholelithiasis 02/21/2014   Endometrial polyp    History of diverticulitis of colon 02/2017   History of ectopic pregnancy    Hypertension    PMB (postmenopausal bleeding)    Swelling of both ankles 02/12/2019   Thickened endometrium     Family History  Problem Relation Age of Onset   Healthy Mother    Healthy Father    Breast cancer Neg Hx     Past Surgical History:  Procedure Laterality Date   CHOLECYSTECTOMY N/A 10/21/2020   Procedure: LAPAROSCOPIC CHOLECYSTECTOMY;  Surgeon: Andria Meuse, MD;  Location: [facility] OR;  Service: General;  Laterality: N/A;   DILATATION & CURETTAGE/HYSTEROSCOPY WITH MYOSURE N/A 06/04/2019   Procedure: DILATATION & CURETTAGE/HYSTEROSCOPY WITH MYOSURE;  Surgeon: Romualdo Bolk, MD;  Location: [facility] [place];  Service: Gynecology;  Laterality: N/A;  follow previous case   ECTOPIC PREGNANCY SURGERY  early 2000s  in [place]   KNEE ARTHROSCOPY WITH LATERAL RELEASE Left 02/08/2024   Procedure: LATERAL RELEASE;  Surgeon: Tarry Kos, MD;  Location: [facility];  Service: Orthopedics;  Laterality: Left;   KNEE ARTHROSCOPY WITH MEDIAL MENISECTOMY Left 02/08/2024   Procedure: LEFT KNEE ARTHROSCOPY WITH PARTIAL MEDIAL MENISECTOMY AND LIMITED SYNOVECTOMY;  Surgeon: Tarry Kos, MD;  Location: [facility];  Service: Orthopedics;  Laterality: Left;  LAPAROSCOPIC APPENDECTOMY N/A 02/20/2014   Procedure: APPENDECTOMY LAPAROSCOPIC;  Surgeon: Liz Malady, MD;  Location: [facility] OR;  Service: General;  Laterality: N/A;   LYSIS OF ADHESION N/A 10/21/2020   Procedure: LYSIS OF ADHESION;  Surgeon: Andria Meuse, MD;  Location: [facility] OR;  Service: General;  Laterality: N/A;   Social History   Occupational History   Not on file  Tobacco Use   Smoking status: Never   Smokeless tobacco: Never  Vaping Use   Vaping status:  Never Used  Substance and Sexual Activity   Alcohol use: No   Drug use: Never   Sexual activity: Not Currently    Birth control/protection: None

## 2024-02-16 ENCOUNTER — Encounter: Payer: Self-pay | Admitting: Physical Therapy

## 2024-02-16 ENCOUNTER — Ambulatory Visit (INDEPENDENT_AMBULATORY_CARE_PROVIDER_SITE_OTHER): Admitting: Rehabilitative and Restorative Service Providers"

## 2024-02-16 DIAGNOSIS — R2689 Other abnormalities of gait and mobility: Secondary | ICD-10-CM

## 2024-02-16 DIAGNOSIS — M25562 Pain in left knee: Secondary | ICD-10-CM

## 2024-02-16 DIAGNOSIS — R600 Localized edema: Secondary | ICD-10-CM

## 2024-02-16 DIAGNOSIS — M6281 Muscle weakness (generalized): Secondary | ICD-10-CM

## 2024-02-16 NOTE — Therapy (Signed)
 OUTPATIENT PHYSICAL THERAPY LOWER EXTREMITY EVALUATION   Patient Name: Margaret Singleton MRN: [id number] DOB:[birth year], 53 y.o., female Today's Date: 02/16/2024  END OF SESSION:  PT End of Session - 02/16/24 1321     Visit Number 1    Number of Visits 12    Date for PT Re-Evaluation 04/12/24    Authorization Type [employer]    PT Start Time 1345    PT Stop Time 1430    PT Time Calculation (min) 45 min    Activity Tolerance Patient tolerated treatment well    Behavior During Therapy [facility] for tasks assessed/performed             Past Medical History:  Diagnosis Date   Acute appendicitis 02/20/2014   Cholelithiasis 02/21/2014   Endometrial polyp    History of diverticulitis of colon 02/2017   History of ectopic pregnancy    Hypertension    PMB (postmenopausal bleeding)    Swelling of both ankles 02/12/2019   Thickened endometrium    Past Surgical History:  Procedure Laterality Date   CHOLECYSTECTOMY N/A 10/21/2020   Procedure: LAPAROSCOPIC CHOLECYSTECTOMY;  Surgeon: Andria Meuse, MD;  Location: [facility] OR;  Service: General;  Laterality: N/A;   DILATATION & CURETTAGE/HYSTEROSCOPY WITH MYOSURE N/A 06/04/2019   Procedure: DILATATION & CURETTAGE/HYSTEROSCOPY WITH MYOSURE;  Surgeon: Romualdo Bolk, MD;  Location: [facility] [place];  Service: Gynecology;  Laterality: N/A;  follow previous case   ECTOPIC PREGNANCY SURGERY  early 2000s  in [place]   KNEE ARTHROSCOPY WITH LATERAL RELEASE Left 02/08/2024   Procedure: LATERAL RELEASE;  Surgeon: Tarry Kos, MD;  Location: [facility];  Service: Orthopedics;  Laterality: Left;   KNEE ARTHROSCOPY WITH MEDIAL MENISECTOMY Left 02/08/2024   Procedure: LEFT KNEE ARTHROSCOPY WITH PARTIAL MEDIAL MENISECTOMY AND LIMITED SYNOVECTOMY;  Surgeon: Tarry Kos, MD;  Location: [facility];  Service: Orthopedics;  Laterality: Left;   LAPAROSCOPIC APPENDECTOMY N/A 02/20/2014   Procedure: APPENDECTOMY  LAPAROSCOPIC;  Surgeon: Liz Malady, MD;  Location: [facility] OR;  Service: General;  Laterality: N/A;   LYSIS OF ADHESION N/A 10/21/2020   Procedure: LYSIS OF ADHESION;  Surgeon: Andria Meuse, MD;  Location: [facility] OR;  Service: General;  Laterality: N/A;   Patient Active Problem List   Diagnosis Date Noted   Maltracking of left patella 01/19/2024   Acute medial meniscus tear of left knee 01/19/2024   Impacted cerumen of right ear 08/25/2023   Encounter for annual health examination 06/29/2023   Encounter for Papanicolaou smear of cervix 06/29/2023   Need for Tdap vaccination 06/29/2023   BMI 40.0-44.9, adult (HCC) 06/29/2023   Pain of both eyes 06/29/2023   Encounter for dental examination 06/29/2023   Screening for STD (sexually transmitted disease) 06/29/2023   Morbid obesity (HCC) 02/15/2023   Abdominal pain 10/20/2020   Vitamin D deficiency 06/22/2019   Chronic nonintractable headache 03/12/2019   Edema 03/12/2019   Essential hypertension 02/12/2019    PCP: Arnette Felts, FNP   REFERRING PROVIDER: Cristie Hem, PA-C   REFERRING DIAG: [phone] (ICD-10-CM) - S/P left knee arthroscopy  THERAPY DIAG:  Acute pain of left knee  Muscle weakness (generalized)  Other abnormalities of gait and mobility  Localized edema  Rationale for Evaluation and Treatment: Rehabilitation  ONSET DATE: status post left knee arthroscopic lateral release with partial medial meniscectomy 02/08/2024  SUBJECTIVE:   SUBJECTIVE STATEMENT: She is having pain and difficulty walking since surgery. She has crutches but says  she does not like to use them, ambulates in without AD today.  PERTINENT HISTORY: Knee post op  PAIN:  NPRS scale: 8/10 upon arrival Pain location: Pain description: constant, achy, sharp Aggravating factors:  Relieving factors: rest, meds   PRECAUTIONS: None  RED FLAGS: None   WEIGHT BEARING RESTRICTIONS: No  FALLS:  Has patient fallen in last 6 months?  No  LIVING ENVIRONMENT Stairs: 3 stairs with handrail but handrail does not sound sturdy Has following equipment at home: Crutches  OCCUPATION: housekeeping  PLOF: Independent  PATIENT GOALS: reduce pain  NEXT MD VISIT: nothing scheduled  OBJECTIVE:  Note: Objective measures were completed at Evaluation unless otherwise noted.  PATIENT SURVEYS:  Patient-Specific Activity Scoring Scheme  "0" represents "unable to perform." "10" represents "able to perform at prior level. 0 1 2 3 4 5 6 7 8 9  10 (Date and Score)   Activity Eval     1. walking 3                       Score 3/10    Total score = sum of the activity scores/number of activities Minimum detectable change (90%CI) for average score = 2 points Minimum detectable change (90%CI) for single activity score = 3 points   EDEMA:  Mild edema noted in left knee   LOWER EXTREMITY ROM:   ROM Right eval Left eval  Hip flexion    Hip extension    Hip abduction    Hip adduction    Hip internal rotation    Hip external rotation    Knee flexion  80/90  Knee extension  0  Ankle dorsiflexion    Ankle plantarflexion    Ankle inversion    Ankle eversion     (Blank rows = not tested)  LOWER EXTREMITY MMT:  MMT Right eval Left eval  Hip flexion  4  Hip extension    Hip abduction    Hip adduction    Hip internal rotation    Hip external rotation    Knee flexion  4  Knee extension  4  Ankle dorsiflexion    Ankle plantarflexion    Ankle inversion    Ankle eversion     (Blank rows = not tested)  FUNCTIONAL TESTS:  Sit to stand: Must use hands to stand   GAIT: Comments: ambulates into clinic without AD, but limited knee flexion, wider BOS, and decreased gait speed noted.   TODAY'S TREATMENT:  Eval HEP creation and review with demonstration and trial set preformed, see below for details  PATIENT EDUCATION: Education details: HEP, PT plan of care Person educated: Patient Education method:  Explanation, Demonstration, Verbal cues, and Handouts Education comprehension: verbalized understanding and needs further education   HOME EXERCISE PROGRAM: Access Code: [email or web address] URL: [email or web address] Date: 02/16/2024 Prepared by: Ivery Quale  Exercises - Supine Heel Slide with Strap  - 2 x daily - 6 x weekly - 1-2 sets - 10 reps - 5 sec hold - Supine Active Straight Leg Raise  - 2 x daily - 6 x weekly - 1-2 sets - 10 reps - 5 sec hold - Seated Singleton Arc Quad  - 2 x daily - 6 x weekly - 1-2 sets - 10 reps - 3 sec hold - Standing Hip Abduction  - 2 x daily - 6 x weekly - 1 sets - 15 reps - seated knee flexion stretch with knee extension strengthening  - 2 x daily - 6  x weekly - 1-2 sets - 10 reps - 5 sec hold - Seated Straight Leg Raise with Quad Contraction  - 2 x daily - 6 x weekly - 1-2 sets - 10 reps  ASSESSMENT:  CLINICAL IMPRESSION: Patient referred to PT status post left knee arthroscopic lateral release with partial medial meniscectomy 02/08/2024. She has full knee extension all ready, but is missing knee flexion ROM. Patient will benefit from skilled PT to address below impairments, limitations and improve overall function.  OBJECTIVE IMPAIRMENTS: decreased activity tolerance, difficulty walking, decreased balance, decreased endurance, decreased mobility, decreased ROM, decreased strength, impaired flexibility, impaired UE/LE use, and pain.  ACTIVITY LIMITATIONS: bending, lifting, carry, locomotion, cleaning, community activity, driving, and or occupation  PERSONAL FACTORS: see PMH are also affecting patient's functional outcome.  REHAB POTENTIAL: Good  CLINICAL DECISION MAKING: Stable/uncomplicated  EVALUATION COMPLEXITY: Low    GOALS: Short term PT Goals Target date: 03/15/2024   Pt will be I and compliant with HEP. Baseline:  Goal status: New Pt will decrease pain by 25% overall with walking Baseline: Goal status: New  Singleton term PT goals  Target date:04/12/2024   Pt will improve knee AROM to WNL to improve functional mobility Baseline: Goal status: New Pt will improve  hip/knee strength to at least 5-/5 MMT to improve functional strength Baseline: Goal status: New Pt will improve PSFS to at least 7/10 functional to show improved function Baseline: Goal status: New Pt will reduce pain to overall less than 2-3/10 with usual activity and work activity. Baseline: Goal status: New  PLAN: PT FREQUENCY: 1-2 times per week   PT DURATION: 4-8 weeks  PLANNED INTERVENTIONS (unless contraindicated): aquatic PT, Canalith repositioning, cryotherapy, Electrical stimulation, Iontophoresis with 4 mg/ml dexamethasome, Moist heat, traction, Ultrasound, gait training, Therapeutic exercise, balance training, neuromuscular re-education, patient/family education, manual techniques, passive ROM, dry needling, taping, vasopnuematic device, vestibular, spinal manipulations, joint manipulations 97110-Therapeutic exercises, 97530- Therapeutic activity, 97112- Neuromuscular re-education, 97535- Self Care, and [id number]- Manual therapy  PLAN FOR NEXT SESSION: knee flexion ROM focus, and quad strength as tolerated.     April Manson, PT,DPT 02/16/2024, 1:45 PM

## 2024-02-17 ENCOUNTER — Encounter: Payer: Self-pay | Admitting: Rehabilitative and Restorative Service Providers"

## 2024-02-17 ENCOUNTER — Ambulatory Visit (INDEPENDENT_AMBULATORY_CARE_PROVIDER_SITE_OTHER): Admitting: Rehabilitative and Restorative Service Providers"

## 2024-02-17 DIAGNOSIS — R600 Localized edema: Secondary | ICD-10-CM | POA: Diagnosis not present

## 2024-02-17 DIAGNOSIS — M25562 Pain in left knee: Secondary | ICD-10-CM | POA: Diagnosis not present

## 2024-02-17 DIAGNOSIS — M6281 Muscle weakness (generalized): Secondary | ICD-10-CM

## 2024-02-17 DIAGNOSIS — R2689 Other abnormalities of gait and mobility: Secondary | ICD-10-CM | POA: Diagnosis not present

## 2024-02-17 NOTE — Therapy (Signed)
 OUTPATIENT PHYSICAL THERAPY LOWER EXTREMITY TREATMENT   Patient Name: Margaret Singleton MRN: [id number] DOB:02-21-1971, 53 y.o., female Today's Date: 02/17/2024  END OF SESSION:  PT End of Session - 02/17/24 1604     Visit Number 2    Number of Visits 12    Date for PT Re-Evaluation 04/12/24    Authorization Type [employer]    PT Start Time 1517    PT Stop Time 1608    PT Time Calculation (min) 51 min    Activity Tolerance Patient tolerated treatment well;No increased pain    Behavior During Therapy [facility] for tasks assessed/performed              Past Medical History:  Diagnosis Date   Acute appendicitis 02/20/2014   Cholelithiasis 02/21/2014   Endometrial polyp    History of diverticulitis of colon 02/2017   History of ectopic pregnancy    Hypertension    PMB (postmenopausal bleeding)    Swelling of both ankles 02/12/2019   Thickened endometrium    Past Surgical History:  Procedure Laterality Date   CHOLECYSTECTOMY N/A 10/21/2020   Procedure: LAPAROSCOPIC CHOLECYSTECTOMY;  Surgeon: Andria Meuse, MD;  Location: [facility] OR;  Service: General;  Laterality: N/A;   DILATATION & CURETTAGE/HYSTEROSCOPY WITH MYOSURE N/A 06/04/2019   Procedure: DILATATION & CURETTAGE/HYSTEROSCOPY WITH MYOSURE;  Surgeon: Romualdo Bolk, MD;  Location: [facility] [place];  Service: Gynecology;  Laterality: N/A;  follow previous case   ECTOPIC PREGNANCY SURGERY  early 2000s  in [place]   KNEE ARTHROSCOPY WITH LATERAL RELEASE Left 02/08/2024   Procedure: LATERAL RELEASE;  Surgeon: Tarry Kos, MD;  Location: [facility];  Service: Orthopedics;  Laterality: Left;   KNEE ARTHROSCOPY WITH MEDIAL MENISECTOMY Left 02/08/2024   Procedure: LEFT KNEE ARTHROSCOPY WITH PARTIAL MEDIAL MENISECTOMY AND LIMITED SYNOVECTOMY;  Surgeon: Tarry Kos, MD;  Location:  SURGERY CENTER;  Service: Orthopedics;  Laterality: Left;   LAPAROSCOPIC APPENDECTOMY N/A 02/20/2014    Procedure: APPENDECTOMY LAPAROSCOPIC;  Surgeon: Liz Malady, MD;  Location: [facility] OR;  Service: General;  Laterality: N/A;   LYSIS OF ADHESION N/A 10/21/2020   Procedure: LYSIS OF ADHESION;  Surgeon: Andria Meuse, MD;  Location: [facility] OR;  Service: General;  Laterality: N/A;   Patient Active Problem List   Diagnosis Date Noted   Maltracking of left patella 01/19/2024   Acute medial meniscus tear of left knee 01/19/2024   Impacted cerumen of right ear 08/25/2023   Encounter for annual health examination 06/29/2023   Encounter for Papanicolaou smear of cervix 06/29/2023   Need for Tdap vaccination 06/29/2023   BMI 40.0-44.9, adult (HCC) 06/29/2023   Pain of both eyes 06/29/2023   Encounter for dental examination 06/29/2023   Screening for STD (sexually transmitted disease) 06/29/2023   Morbid obesity (HCC) 02/15/2023   Abdominal pain 10/20/2020   Vitamin D deficiency 06/22/2019   Chronic nonintractable headache 03/12/2019   Edema 03/12/2019   Essential hypertension 02/12/2019    PCP: Arnette Felts, FNP   REFERRING PROVIDER: Tarry Kos, MD   REFERRING DIAG: [id number] (ICD-10-CM) - S/P left knee arthroscopy  THERAPY DIAG:  Acute pain of left knee  Muscle weakness (generalized)  Other abnormalities of gait and mobility  Localized edema  Rationale for Evaluation and Treatment: Rehabilitation  ONSET DATE: Status post left knee arthroscopic lateral release with partial medial meniscectomy 02/08/2024  SUBJECTIVE:   SUBJECTIVE STATEMENT: Margaret Singleton reports early HEP compliance.  Pain is "not bad unless  I bend it too far."  She is having pain and difficulty walking since surgery. She has crutches but says she does not like to use them, ambulates in without AD today.  PERTINENT HISTORY: Knee post op  PAIN:  NPRS scale: 3-8/10 this week Pain location: Left knee Pain description: constant, achy, can be sharp with flexion Aggravating factors: Too much WB and flexion  AROM Relieving factors: rest, meds, ice   PRECAUTIONS: None  RED FLAGS: None   WEIGHT BEARING RESTRICTIONS: No  FALLS:  Has patient fallen in last 6 months? No  LIVING ENVIRONMENT Stairs: 3 stairs with handrail but handrail does not sound sturdy Has following equipment at home: Crutches  OCCUPATION: housekeeping  PLOF: Independent  PATIENT GOALS: reduce pain  NEXT MD VISIT: nothing scheduled  OBJECTIVE:  Note: Objective measures were completed at Evaluation unless otherwise noted.  PATIENT SURVEYS:  Patient-Specific Activity Scoring Scheme  "0" represents "unable to perform." "10" represents "able to perform at prior level. 0 1 2 3 4 5 6 7 8 9  10 (Date and Score)   Activity Eval     1. walking 3                       Score 3/10    Total score = sum of the activity scores/number of activities Minimum detectable change (90%CI) for average score = 2 points Minimum detectable change (90%CI) for single activity score = 3 points   EDEMA:  Mild edema noted in left knee   LOWER EXTREMITY ROM:   ROM Right eval Left eval  Hip flexion    Hip extension    Hip abduction    Hip adduction    Hip internal rotation    Hip external rotation    Knee flexion  80/90  Knee extension  0  Ankle dorsiflexion    Ankle plantarflexion    Ankle inversion    Ankle eversion     (Blank rows = not tested)  LOWER EXTREMITY MMT:  MMT Right eval Left eval  Hip flexion  4  Hip extension    Hip abduction    Hip adduction    Hip internal rotation    Hip external rotation    Knee flexion  4  Knee extension  4  Ankle dorsiflexion    Ankle plantarflexion    Ankle inversion    Ankle eversion     (Blank rows = not tested)  FUNCTIONAL TESTS:  Sit to stand: Must use hands to stand   GAIT: Comments: ambulates into clinic without AD, but limited knee flexion, wider BOS, and decreased gait speed noted.   TODAY'S TREATMENT:  02/17/2024  Recumbent bike seat 1 for active  assisted range of motion 5 minutes Supine quadriceps sets 2 sets of 10 for 5 seconds with pillow under bilateral knees Supine heel slide with strap 10 x 5 seconds Supine straight leg raise 10 x 5 seconds Seated long arc quad 10 x 3 seconds Seated knee flexion stretch 10 x 5 seconds Seated straight leg raises 2 sets of 5 for 3 seconds  Functional Activities (for getting rid of lateral lean with gait): Standing hip abduction 15 x 3 seconds  Vaso left knee 10 minutes Medium 24*   Eval HEP creation and review with demonstration and trial set preformed, see below for details  PATIENT EDUCATION: Education details: HEP, PT plan of care Person educated: Patient Education method: Explanation, Demonstration, Verbal cues, and Handouts Education comprehension:  verbalized understanding and needs further education   HOME EXERCISE PROGRAM: Access Code: [email or web address] URL: [email or web address] Date: 02/16/2024 Prepared by: Ivery Quale  Exercises - Supine Heel Slide with Strap  - 2 x daily - 6 x weekly - 1-2 sets - 10 reps - 5 sec hold - Supine Active Straight Leg Raise  - 2 x daily - 6 x weekly - 1-2 sets - 10 reps - 5 sec hold - Seated Long Arc Quad  - 2 x daily - 6 x weekly - 1-2 sets - 10 reps - 3 sec hold - Standing Hip Abduction  - 2 x daily - 6 x weekly - 1 sets - 15 reps - seated knee flexion stretch with knee extension strengthening  - 2 x daily - 6 x weekly - 1-2 sets - 10 reps - 5 sec hold - Seated Straight Leg Raise with Quad Contraction  - 2 x daily - 6 x weekly - 1-2 sets - 10 reps  ASSESSMENT:  CLINICAL IMPRESSION: Margaret Singleton reports and demonstrates good early home exercise program understanding and compliance.  I added a supine quadriceps set exercise to her current home exercises to address quadriceps strength, edema and weightbearing function.  Margaret Singleton's prognosis to meet long-term goals remains good with the recommended plan of care.  Patient referred to PT  status post left knee arthroscopic lateral release with partial medial meniscectomy 02/08/2024. She has full knee extension all ready, but is missing knee flexion ROM. Patient will benefit from skilled PT to address below impairments, limitations and improve overall function.  OBJECTIVE IMPAIRMENTS: decreased activity tolerance, difficulty walking, decreased balance, decreased endurance, decreased mobility, decreased ROM, decreased strength, impaired flexibility, impaired UE/LE use, and pain.  ACTIVITY LIMITATIONS: bending, lifting, carry, locomotion, cleaning, community activity, driving, and or occupation  PERSONAL FACTORS: see PMH are also affecting patient's functional outcome.  REHAB POTENTIAL: Good  CLINICAL DECISION MAKING: Stable/uncomplicated  EVALUATION COMPLEXITY: Low    GOALS: Short term PT Goals Target date: 03/15/2024   Pt will be I and compliant with HEP. Baseline:  Goal status: On Going 02/17/2024 Pt will decrease pain by 25% overall with walking Baseline: Goal status: New  Long term PT goals Target date:04/12/2024   Pt will improve knee AROM to WNL to improve functional mobility Baseline: Goal status: New Pt will improve  hip/knee strength to at least 5-/5 MMT to improve functional strength Baseline: Goal status: New Pt will improve PSFS to at least 7/10 functional to show improved function Baseline: Goal status: New Pt will reduce pain to overall less than 2-3/10 with usual activity and work activity. Baseline: Goal status: New  PLAN: PT FREQUENCY: 1-2 times per week   PT DURATION: 4-8 weeks  PLANNED INTERVENTIONS (unless contraindicated): aquatic PT, Canalith repositioning, cryotherapy, Electrical stimulation, Iontophoresis with 4 mg/ml dexamethasome, Moist heat, traction, Ultrasound, gait training, Therapeutic exercise, balance training, neuromuscular re-education, patient/family education, manual techniques, passive ROM, dry needling, taping,  vasopnuematic device, vestibular, spinal manipulations, joint manipulations 97110-Therapeutic exercises, 97530- Therapeutic activity, 97112- Neuromuscular re-education, 97535- Self Care, and 54098- Manual therapy  PLAN FOR NEXT SESSION: Quadriceps strengthening and edema control.  Flexion AROM as symptoms allow.   Cherlyn Cushing, PT, MPT 02/17/2024, 4:24 PM

## 2024-02-21 ENCOUNTER — Encounter: Payer: Self-pay | Admitting: Physical Therapy

## 2024-02-21 ENCOUNTER — Ambulatory Visit: Admitting: Physical Therapy

## 2024-02-21 DIAGNOSIS — M6281 Muscle weakness (generalized): Secondary | ICD-10-CM

## 2024-02-21 DIAGNOSIS — M25562 Pain in left knee: Secondary | ICD-10-CM | POA: Diagnosis not present

## 2024-02-21 DIAGNOSIS — R2689 Other abnormalities of gait and mobility: Secondary | ICD-10-CM | POA: Diagnosis not present

## 2024-02-21 DIAGNOSIS — R600 Localized edema: Secondary | ICD-10-CM | POA: Diagnosis not present

## 2024-02-21 NOTE — Therapy (Signed)
 OUTPATIENT PHYSICAL THERAPY LOWER EXTREMITY TREATMENT   Patient Name: Margaret Singleton MRN: [id number] DOB:[birth year], 53 y.o., female Today's Date: 02/21/2024  END OF SESSION:  PT End of Session - 02/21/24 1533     Visit Number 3    Number of Visits 12    Date for PT Re-Evaluation 04/12/24    Authorization Type [employer] State    PT Start Time 1515    PT Stop Time 1600    PT Time Calculation (min) 45 min    Activity Tolerance Patient tolerated treatment well;No increased pain    Behavior During Therapy [facility] for tasks assessed/performed               Past Medical History:  Diagnosis Date   Acute appendicitis 02/20/2014   Cholelithiasis 02/21/2014   Endometrial polyp    History of diverticulitis of colon 02/2017   History of ectopic pregnancy    Hypertension    PMB (postmenopausal bleeding)    Swelling of both ankles 02/12/2019   Thickened endometrium    Past Surgical History:  Procedure Laterality Date   CHOLECYSTECTOMY N/A 10/21/2020   Procedure: LAPAROSCOPIC CHOLECYSTECTOMY;  Surgeon: Andria Meuse, MD;  Location: [facility] OR;  Service: General;  Laterality: N/A;   DILATATION & CURETTAGE/HYSTEROSCOPY WITH MYOSURE N/A 06/04/2019   Procedure: DILATATION & CURETTAGE/HYSTEROSCOPY WITH MYOSURE;  Surgeon: Romualdo Bolk, MD;  Location: [facility];  Service: Gynecology;  Laterality: N/A;  follow previous case   ECTOPIC PREGNANCY SURGERY  early 2000s  in [place]   KNEE ARTHROSCOPY WITH LATERAL RELEASE Left 02/08/2024   Procedure: LATERAL RELEASE;  Surgeon: Tarry Kos, MD;  Location: [facility];  Service: Orthopedics;  Laterality: Left;   KNEE ARTHROSCOPY WITH MEDIAL MENISECTOMY Left 02/08/2024   Procedure: LEFT KNEE ARTHROSCOPY WITH PARTIAL MEDIAL MENISECTOMY AND LIMITED SYNOVECTOMY;  Surgeon: Tarry Kos, MD;  Location: [facility];  Service: Orthopedics;  Laterality: Left;   LAPAROSCOPIC APPENDECTOMY N/A 02/20/2014    Procedure: APPENDECTOMY LAPAROSCOPIC;  Surgeon: Liz Malady, MD;  Location: [facility] OR;  Service: General;  Laterality: N/A;   LYSIS OF ADHESION N/A 10/21/2020   Procedure: LYSIS OF ADHESION;  Surgeon: Andria Meuse, MD;  Location: [facility] OR;  Service: General;  Laterality: N/A;   Patient Active Problem List   Diagnosis Date Noted   Maltracking of left patella 01/19/2024   Acute medial meniscus tear of left knee 01/19/2024   Impacted cerumen of right ear 08/25/2023   Encounter for annual health examination 06/29/2023   Encounter for Papanicolaou smear of cervix 06/29/2023   Need for Tdap vaccination 06/29/2023   BMI 40.0-44.9, adult (HCC) 06/29/2023   Pain of both eyes 06/29/2023   Encounter for dental examination 06/29/2023   Screening for STD (sexually transmitted disease) 06/29/2023   Morbid obesity (HCC) 02/15/2023   Abdominal pain 10/20/2020   Vitamin D deficiency 06/22/2019   Chronic nonintractable headache 03/12/2019   Edema 03/12/2019   Essential hypertension 02/12/2019    PCP: Arnette Felts, FNP   REFERRING PROVIDER: Cristie Hem, PA-C   REFERRING DIAG: [phone] (ICD-10-CM) - S/P left knee arthroscopy  THERAPY DIAG:  Acute pain of left knee  Muscle weakness (generalized)  Other abnormalities of gait and mobility  Localized edema  Rationale for Evaluation and Treatment: Rehabilitation  ONSET DATE: Status post left knee arthroscopic lateral release with partial medial meniscectomy 02/08/2024  SUBJECTIVE:   SUBJECTIVE STATEMENT: Pt arriving reporting 8-9/10 pain in her left knee. Pt  using her brace for support.   PERTINENT HISTORY: Knee post op  PAIN:  NPRS scale: 8-9/10 this week Pain location: Left knee Pain description: constant, achy, can be sharp with flexion Aggravating factors: Too much WB and flexion AROM Relieving factors: rest, meds, ice   PRECAUTIONS: None  RED FLAGS: None   WEIGHT BEARING RESTRICTIONS: No  FALLS:  Has patient  fallen in last 6 months? No  LIVING ENVIRONMENT Stairs: 3 stairs with handrail but handrail does not sound sturdy Has following equipment at home: Crutches  OCCUPATION: housekeeping  PLOF: Independent  PATIENT GOALS: reduce pain  NEXT MD VISIT: nothing scheduled  OBJECTIVE:  Note: Objective measures were completed at Evaluation unless otherwise noted.  PATIENT SURVEYS:  Patient-Specific Activity Scoring Scheme  "0" represents "unable to perform." "10" represents "able to perform at prior level. 0 1 2 3 4 5 6 7 8 9  10 (Date and Score)   Activity Eval     1. walking 3                       Score 3    Total score = sum of the activity scores/number of activities Minimum detectable change (90%CI) for average score = 2 points Minimum detectable change (90%CI) for single activity score = 3 points   EDEMA:  Mild edema noted in left knee   LOWER EXTREMITY ROM:   ROM Right eval Left eval Left 02/21/24  Hip flexion     Hip extension     Hip abduction     Hip adduction     Hip internal rotation     Hip external rotation     Knee flexion  80/90 Passive supine 90  Knee extension  0 0  Ankle dorsiflexion     Ankle plantarflexion     Ankle inversion     Ankle eversion      (Blank rows = not tested)  LOWER EXTREMITY MMT:  MMT Right eval Left eval  Hip flexion  4  Hip extension    Hip abduction    Hip adduction    Hip internal rotation    Hip external rotation    Knee flexion  4  Knee extension  4  Ankle dorsiflexion    Ankle plantarflexion    Ankle inversion    Ankle eversion     (Blank rows = not tested)  FUNCTIONAL TESTS:  Sit to stand: Must use hands to stand   GAIT: Comments: ambulates into clinic without AD, but limited knee flexion, wider BOS, and decreased gait speed noted.   TODAY'S TREATMENT:  02/21/24:  TherEx:  Nustep: level 3 x 8 minutes, LE/UE Tail gait knee active assisted knee flexion x 10 holding 5 sec Seated SLR: 2 x 10   LAQ: 2 # 2 x 10  Calf stretch on slant board: x 2 holding 30 sec TherActivites:  Sit to stand: x 10 c UE support Step up on 4 inch step c single UE support x 10 leading c each LE Double leg press: 50# 2 x 10  Single leg press: 25# x 10  Manual  PROM left knee flexion to pt's tolerance in supine Modalities:  Vasopneumatic: 34 deg x 10 minutes, medium compression, left LE elevated on wedge    02/17/2024  Recumbent bike seat 1 for active assisted range of motion 5 minutes Supine quadriceps sets 2 sets of 10 for 5 seconds with pillow under bilateral knees Supine heel slide with  strap 10 x 5 seconds Supine straight leg raise 10 x 5 seconds Seated long arc quad 10 x 3 seconds Seated knee flexion stretch 10 x 5 seconds Seated straight leg raises 2 sets of 5 for 3 seconds  Functional Activities (for getting rid of lateral lean with gait): Standing hip abduction 15 x 3 seconds  Vaso left knee 10 minutes Medium 24*   Eval HEP creation and review with demonstration and trial set preformed, see below for details  PATIENT EDUCATION: Education details: HEP, PT plan of care Person educated: Patient Education method: Explanation, Demonstration, Verbal cues, and Handouts Education comprehension: verbalized understanding and needs further education   HOME EXERCISE PROGRAM: Access Code: [email or web address] URL: [email or web address] Date: 02/16/2024 Prepared by: Ivery Quale  Exercises - Supine Heel Slide with Strap  - 2 x daily - 6 x weekly - 1-2 sets - 10 reps - 5 sec hold - Supine Active Straight Leg Raise  - 2 x daily - 6 x weekly - 1-2 sets - 10 reps - 5 sec hold - Seated Long Arc Quad  - 2 x daily - 6 x weekly - 1-2 sets - 10 reps - 3 sec hold - Standing Hip Abduction  - 2 x daily - 6 x weekly - 1 sets - 15 reps - seated knee flexion stretch with knee extension strengthening  - 2 x daily - 6 x weekly - 1-2 sets - 10 reps - 5 sec hold - Seated Straight Leg Raise with Quad  Contraction  - 2 x daily - 6 x weekly - 1-2 sets - 10 reps  ASSESSMENT:  CLINICAL IMPRESSION: Milayah reports and demonstrates good early home exercise program understanding and compliance.  I added a supine quadriceps set exercise to her current home exercises to address quadriceps strength, edema and weightbearing function.  Ryllie's prognosis to meet long-term goals remains good with the recommended plan of care.  Patient referred to PT status post left knee arthroscopic lateral release with partial medial meniscectomy 02/08/2024. She has full knee extension all ready, but is missing knee flexion ROM. Patient will benefit from skilled PT to address below impairments, limitations and improve overall function.  OBJECTIVE IMPAIRMENTS: decreased activity tolerance, difficulty walking, decreased balance, decreased endurance, decreased mobility, decreased ROM, decreased strength, impaired flexibility, impaired UE/LE use, and pain.  ACTIVITY LIMITATIONS: bending, lifting, carry, locomotion, cleaning, community activity, driving, and or occupation  PERSONAL FACTORS: see PMH are also affecting patient's functional outcome.  REHAB POTENTIAL: Good  CLINICAL DECISION MAKING: Stable/uncomplicated  EVALUATION COMPLEXITY: Low    GOALS: Short term PT Goals Target date: 03/15/2024   Pt will be I and compliant with HEP. Baseline:  Goal status: MET 02/21/2024  Pt will decrease pain by 25% overall with walking Baseline: Goal status: on-going 02/21/24  Long term PT goals Target date:04/12/2024   Pt will improve knee AROM to WNL to improve functional mobility Baseline: Goal status: New Pt will improve  hip/knee strength to at least 5-/5 MMT to improve functional strength Baseline: Goal status: New Pt will improve PSFS to at least 7/10 functional to show improved function Baseline: Goal status: New Pt will reduce pain to overall less than 2-3/10 with usual activity and work  activity. Baseline: Goal status: New  PLAN: PT FREQUENCY: 1-2 times per week   PT DURATION: 4-8 weeks  PLANNED INTERVENTIONS (unless contraindicated): aquatic PT, Canalith repositioning, cryotherapy, Electrical stimulation, Iontophoresis with 4 mg/ml dexamethasome, Moist heat, traction, Ultrasound, gait training, Therapeutic exercise, balance training, neuromuscular  re-education, patient/family education, manual techniques, passive ROM, dry needling, taping, vasopnuematic device, vestibular, spinal manipulations, joint manipulations 97110-Therapeutic exercises, 97530- Therapeutic activity, 97112- Neuromuscular re-education, 97535- Self Care, and 40981- Manual therapy  PLAN FOR NEXT SESSION: Quadriceps strengthening and edema control.  Flexion AROM as symptoms allow.   Sharmon Leyden, PT, MPT 02/21/2024, 3:45 PM

## 2024-02-22 ENCOUNTER — Encounter: Admitting: Rehabilitative and Restorative Service Providers"

## 2024-03-01 ENCOUNTER — Encounter: Payer: Self-pay | Admitting: Rehabilitative and Restorative Service Providers"

## 2024-03-01 ENCOUNTER — Ambulatory Visit (INDEPENDENT_AMBULATORY_CARE_PROVIDER_SITE_OTHER): Admitting: Rehabilitative and Restorative Service Providers"

## 2024-03-01 DIAGNOSIS — R600 Localized edema: Secondary | ICD-10-CM | POA: Diagnosis not present

## 2024-03-01 DIAGNOSIS — M6281 Muscle weakness (generalized): Secondary | ICD-10-CM | POA: Diagnosis not present

## 2024-03-01 DIAGNOSIS — R2689 Other abnormalities of gait and mobility: Secondary | ICD-10-CM

## 2024-03-01 DIAGNOSIS — M25562 Pain in left knee: Secondary | ICD-10-CM

## 2024-03-01 NOTE — Therapy (Signed)
 OUTPATIENT PHYSICAL THERAPY  TREATMENT   Patient Name: Margaret Singleton MRN: [id number] DOB:13-Jan-1971, 53 y.o., female Today's Date: 03/01/2024  END OF SESSION:  PT End of Session - 03/01/24 1426     Visit Number 4    Number of Visits 12    Date for PT Re-Evaluation 04/12/24    Authorization Type [employer] State    PT Start Time 1426    PT Stop Time 1515    PT Time Calculation (min) 49 min    Activity Tolerance Patient limited by pain    Behavior During Therapy [facility] for tasks assessed/performed                Past Medical History:  Diagnosis Date   Acute appendicitis 02/20/2014   Cholelithiasis 02/21/2014   Endometrial polyp    History of diverticulitis of colon 02/2017   History of ectopic pregnancy    Hypertension    PMB (postmenopausal bleeding)    Swelling of both ankles 02/12/2019   Thickened endometrium    Past Surgical History:  Procedure Laterality Date   CHOLECYSTECTOMY N/A 10/21/2020   Procedure: LAPAROSCOPIC CHOLECYSTECTOMY;  Surgeon: Andria Meuse, MD;  Location: [facility] OR;  Service: General;  Laterality: N/A;   DILATATION & CURETTAGE/HYSTEROSCOPY WITH MYOSURE N/A 06/04/2019   Procedure: DILATATION & CURETTAGE/HYSTEROSCOPY WITH MYOSURE;  Surgeon: Romualdo Bolk, MD;  Location: [facility];  Service: Gynecology;  Laterality: N/A;  follow previous case   ECTOPIC PREGNANCY SURGERY  early 2000s  in [place]   KNEE ARTHROSCOPY WITH LATERAL RELEASE Left 02/08/2024   Procedure: LATERAL RELEASE;  Surgeon: Tarry Kos, MD;  Location: [facility];  Service: Orthopedics;  Laterality: Left;   KNEE ARTHROSCOPY WITH MEDIAL MENISECTOMY Left 02/08/2024   Procedure: LEFT KNEE ARTHROSCOPY WITH PARTIAL MEDIAL MENISECTOMY AND LIMITED SYNOVECTOMY;  Surgeon: Tarry Kos, MD;  Location: [facility];  Service: Orthopedics;  Laterality: Left;   LAPAROSCOPIC APPENDECTOMY N/A 02/20/2014   Procedure: APPENDECTOMY LAPAROSCOPIC;  Surgeon:  Liz Malady, MD;  Location: [facility] OR;  Service: General;  Laterality: N/A;   LYSIS OF ADHESION N/A 10/21/2020   Procedure: LYSIS OF ADHESION;  Surgeon: Andria Meuse, MD;  Location: [facility] OR;  Service: General;  Laterality: N/A;   Patient Active Problem List   Diagnosis Date Noted   Maltracking of left patella 01/19/2024   Acute medial meniscus tear of left knee 01/19/2024   Impacted cerumen of right ear 08/25/2023   Encounter for annual health examination 06/29/2023   Encounter for Papanicolaou smear of cervix 06/29/2023   Need for Tdap vaccination 06/29/2023   BMI 40.0-44.9, adult (HCC) 06/29/2023   Pain of both eyes 06/29/2023   Encounter for dental examination 06/29/2023   Screening for STD (sexually transmitted disease) 06/29/2023   Morbid obesity (HCC) 02/15/2023   Abdominal pain 10/20/2020   Vitamin D deficiency 06/22/2019   Chronic nonintractable headache 03/12/2019   Edema 03/12/2019   Essential hypertension 02/12/2019    PCP: Arnette Felts, FNP   REFERRING PROVIDER: Cristie Hem, PA-C   REFERRING DIAG: [phone] (ICD-10-CM) - S/P left knee arthroscopy  THERAPY DIAG:  Acute pain of left knee  Muscle weakness (generalized)  Other abnormalities of gait and mobility  Localized edema  Rationale for Evaluation and Treatment: Rehabilitation  ONSET DATE: Status post left knee arthroscopic lateral release with partial medial meniscectomy 02/08/2024  SUBJECTIVE:   SUBJECTIVE STATEMENT: Pt indicated having swelling in foot and feeling variable pain.  Pt  indicated "she can still feel pain in bone."  PERTINENT HISTORY: Knee post op  PAIN:  NPRS scale: reported moderate Pain location: Left knee Pain description: constant, achy, can be sharp with flexion Aggravating factors: Too much WB and flexion AROM Relieving factors: rest, meds, ice   PRECAUTIONS: None  RED FLAGS: None   WEIGHT BEARING RESTRICTIONS: No  FALLS:  Has patient fallen in last 6  months? No  LIVING ENVIRONMENT Stairs: 3 stairs with handrail but handrail does not sound sturdy Has following equipment at home: Crutches  OCCUPATION: housekeeping  PLOF: Independent  PATIENT GOALS: reduce pain  NEXT MD VISIT: nothing scheduled  OBJECTIVE:  Note: Objective measures were completed at Evaluation unless otherwise noted.  PATIENT SURVEYS:  Patient-Specific Activity Scoring Scheme  "0" represents "unable to perform." "10" represents "able to perform at prior level. 0 1 2 3 4 5 6 7 8 9  10 (Date and Score)   Activity Eval  02/16/2024    1. walking 3                       Score 3    Total score = sum of the activity scores/number of activities Minimum detectable change (90%CI) for average score = 2 points Minimum detectable change (90%CI) for single activity score = 3 points   EDEMA:  02/16/2024 Mild edema noted in left knee   LOWER EXTREMITY ROM:   ROM Right eval Left 02/16/2024 eval Left 02/21/24  Hip flexion     Hip extension     Hip abduction     Hip adduction     Hip internal rotation     Hip external rotation     Knee flexion  80/90 Passive supine 90  Knee extension  0 0  Ankle dorsiflexion     Ankle plantarflexion     Ankle inversion     Ankle eversion      (Blank rows = not tested)  LOWER EXTREMITY MMT:  MMT Right eval Left Eval 02/16/2024  Hip flexion  4  Hip extension    Hip abduction    Hip adduction    Hip internal rotation    Hip external rotation    Knee flexion  4  Knee extension  4  Ankle dorsiflexion    Ankle plantarflexion    Ankle inversion    Ankle eversion     (Blank rows = not tested)  FUNCTIONAL TESTS:  02/16/2024 Sit to stand: Must use hands to stand   GAIT: 02/16/2024 Comments: ambulates into clinic without AD, but limited knee flexion, wider BOS, and decreased gait speed noted.                   TODAY'S TREATMENT:      DATE: 03/01/2024:  Therex: Nustep lvl 5 8 mins UE/LE  Incline  gastroc stretch bilateral 30 sec x 3 Seated LAQ Lt leg 2.5 lbs 2 x 15 with end range pauses Supine AROM heel slide Lt leg 5 sec hold x 10    Neuro Re-ed (to improve balance, neuro muscular recruitment) Tandem stance 1 min x 1 bilateral in // bars on ground, 1 min x 1 bilateral on foam with very occasional HHA Feet together alternating heel/toe lift x 20 each way with occasional HHA on bar Supine quad set 5 sec hold x 10    TherActivity: (to improve transfers, stairs, squatting) Leg press double leg  x 15 62 lbs , single leg x15 performed  bilaterally 43  lbs    Vaso 10 mins Lt knee 34 deg medium compression in elevation  TODAY'S TREATMENT:      DATE: 02/21/24:  TherEx:  Nustep: level 3 x 8 minutes, LE/UE Tail gait knee active assisted knee flexion x 10 holding 5 sec Seated SLR: 2 x 10  LAQ: 2 # 2 x 10  Calf stretch on slant board: x 2 holding 30 sec TherActivites:  Sit to stand: x 10 c UE support Step up on 4 inch step c single UE support x 10 leading c each LE Double leg press: 50# 2 x 10  Single leg press: 25# x 10  Manual  PROM left knee flexion to pt's tolerance in supine Modalities:  Vasopneumatic: 34 deg x 10 minutes, medium compression, left LE elevated on wedge    TODAY'S TREATMENT:      DATE: 02/17/2024  Recumbent bike seat 1 for active assisted range of motion 5 minutes Supine quadriceps sets 2 sets of 10 for 5 seconds with pillow under bilateral knees Supine heel slide with strap 10 x 5 seconds Supine straight leg raise 10 x 5 seconds Seated long arc quad 10 x 3 seconds Seated knee flexion stretch 10 x 5 seconds Seated straight leg raises 2 sets of 5 for 3 seconds  Functional Activities (for getting rid of lateral lean with gait): Standing hip abduction 15 x 3 seconds  Vaso left knee 10 minutes Medium 24*    PATIENT EDUCATION: Eval Education details: HEP, PT plan of care Person educated: Patient Education method: Explanation, Demonstration, Verbal  cues, and Handouts Education comprehension: verbalized understanding and needs further education   HOME EXERCISE PROGRAM: Access Code: [email or web address] URL: [email or web address] Date: 02/16/2024 Prepared by: Ivery Quale  Exercises - Supine Heel Slide with Strap  - 2 x daily - 6 x weekly - 1-2 sets - 10 reps - 5 sec hold - Supine Active Straight Leg Raise  - 2 x daily - 6 x weekly - 1-2 sets - 10 reps - 5 sec hold - Seated Long Arc Quad  - 2 x daily - 6 x weekly - 1-2 sets - 10 reps - 3 sec hold - Standing Hip Abduction  - 2 x daily - 6 x weekly - 1 sets - 15 reps - seated knee flexion stretch with knee extension strengthening  - 2 x daily - 6 x weekly - 1-2 sets - 10 reps - 5 sec hold - Seated Straight Leg Raise with Quad Contraction  - 2 x daily - 6 x weekly - 1-2 sets - 10 reps  ASSESSMENT:  CLINICAL IMPRESSION:  Most evident restriction in flexion mobility with end range pains noted in movement.   OBJECTIVE IMPAIRMENTS: decreased activity tolerance, difficulty walking, decreased balance, decreased endurance, decreased mobility, decreased ROM, decreased strength, impaired flexibility, impaired UE/LE use, and pain.  ACTIVITY LIMITATIONS: bending, lifting, carry, locomotion, cleaning, community activity, driving, and or occupation  PERSONAL FACTORS: see PMH are also affecting patient's functional outcome.  REHAB POTENTIAL: Good  CLINICAL DECISION MAKING: Stable/uncomplicated  EVALUATION COMPLEXITY: Low    GOALS: Short term PT Goals Target date: 03/15/2024   Pt will be I and compliant with HEP. Baseline:  Goal status: MET 02/21/2024  Pt will decrease pain by 25% overall with walking Baseline: Goal status: on-going 02/21/24  Long term PT goals Target date:04/12/2024   Pt will improve knee AROM to WNL to improve functional mobility Baseline: Goal status: New Pt will improve  hip/knee strength  to at least 5-/5 MMT to improve functional strength Baseline: Goal  status: New Pt will improve PSFS to at least 7/10 functional to show improved function Baseline: Goal status: New Pt will reduce pain to overall less than 2-3/10 with usual activity and work activity. Baseline: Goal status: New  PLAN: PT FREQUENCY: 1-2 times per week   PT DURATION: 4-8 weeks  PLANNED INTERVENTIONS (unless contraindicated): aquatic PT, Canalith repositioning, cryotherapy, Electrical stimulation, Iontophoresis with 4 mg/ml dexamethasome, Moist heat, traction, Ultrasound, gait training, Therapeutic exercise, balance training, neuromuscular re-education, patient/family education, manual techniques, passive ROM, dry needling, taping, vasopnuematic device, vestibular, spinal manipulations, joint manipulations 97110-Therapeutic exercises, 97530- Therapeutic activity, 97112- Neuromuscular re-education, 97535- Self Care, and 16109- Manual therapy  PLAN FOR NEXT SESSION: Continued mobility gains, strengthening.   Chyrel Masson, PT, DPT, OCS, ATC 03/01/24  3:08 PM

## 2024-03-06 ENCOUNTER — Ambulatory Visit: Admitting: Physical Therapy

## 2024-03-06 ENCOUNTER — Encounter: Payer: Self-pay | Admitting: Physical Therapy

## 2024-03-06 DIAGNOSIS — M25562 Pain in left knee: Secondary | ICD-10-CM | POA: Diagnosis not present

## 2024-03-06 DIAGNOSIS — M6281 Muscle weakness (generalized): Secondary | ICD-10-CM

## 2024-03-06 DIAGNOSIS — R2689 Other abnormalities of gait and mobility: Secondary | ICD-10-CM | POA: Diagnosis not present

## 2024-03-06 DIAGNOSIS — R600 Localized edema: Secondary | ICD-10-CM | POA: Diagnosis not present

## 2024-03-06 NOTE — Therapy (Addendum)
 OUTPATIENT PHYSICAL THERAPY  TREATMENT PHYSICAL THERAPY DISCHARGE SUMMARY  Visits from Start of Care: 5  Current functional level related to goals / functional outcomes: See below   Remaining deficits: See below   Education / Equipment: HEP  Plan:  Patient goals were not met. Patient is being discharged due to not returning since last visit >30 days  Redell Moose, PT, DPT 06/04/24 9:26 AM    Patient Name: Margaret Singleton MRN: [id number] DOB:Aug 21, 1971, 53 y.o., female Today's Date: 03/06/2024  END OF SESSION:  PT End of Session - 03/06/24 1518     Visit Number 5    Number of Visits 12    Date for PT Re-Evaluation 04/12/24    Authorization Type A[employer]na State    PT Start Time 1515    PT Stop Time 1548    PT Time Calculation (min) 33 min    Activity Tolerance Patient tolerated treatment well    Behavior During Therapy [facility] for tasks assessed/performed                Past Medical History:  Diagnosis Date   Acute appendicitis 02/20/2014   Cholelithiasis 02/21/2014   Endometrial polyp    History of diverticulitis of colon 02/2017   History of ectopic pregnancy    Hypertension    PMB (postmenopausal bleeding)    Swelling of both ankles 02/12/2019   Thickened endometrium    Past Surgical History:  Procedure Laterality Date   CHOLECYSTECTOMY N/A 10/21/2020   Procedure: LAPAROSCOPIC CHOLECYSTECTOMY;  Surgeon: Teresa Lonni HERO, MD;  Location: [facility] OR;  Service: General;  Laterality: N/A;   DILATATION & CURETTAGE/HYSTEROSCOPY WITH MYOSURE N/A 06/04/2019   Procedure: DILATATION & CURETTAGE/HYSTEROSCOPY WITH MYOSURE;  Surgeon: Jannis Kate Norris, MD;  Location: [facility] ;  Service: Gynecology;  Laterality: N/A;  follow previous case   ECTOPIC PREGNANCY SURGERY  early 2000s  in [place]   KNEE ARTHROSCOPY WITH LATERAL RELEASE Left 02/08/2024   Procedure: LATERAL RELEASE;  Surgeon: Jerri Kay HERO, MD;  Location: [facility];  Service:  Orthopedics;  Laterality: Left;   KNEE ARTHROSCOPY WITH MEDIAL MENISECTOMY Left 02/08/2024   Procedure: LEFT KNEE ARTHROSCOPY WITH PARTIAL MEDIAL MENISECTOMY AND LIMITED SYNOVECTOMY;  Surgeon: Jerri Kay HERO, MD;  Location: [facility];  Service: Orthopedics;  Laterality: Left;   LAPAROSCOPIC APPENDECTOMY N/A 02/20/2014   Procedure: APPENDECTOMY LAPAROSCOPIC;  Surgeon: Dann FORBES Hummer, MD;  Location: [facility] OR;  Service: General;  Laterality: N/A;   LYSIS OF ADHESION N/A 10/21/2020   Procedure: LYSIS OF ADHESION;  Surgeon: Teresa Lonni HERO, MD;  Location: [facility] OR;  Service: General;  Laterality: N/A;   Patient Active Problem List   Diagnosis Date Noted   Maltracking of left patella 01/19/2024   Acute medial meniscus tear of left knee 01/19/2024   Impacted cerumen of right ear 08/25/2023   Encounter for annual health examination 06/29/2023   Encounter for Papanicolaou smear of cervix 06/29/2023   Need for Tdap vaccination 06/29/2023   BMI 40.0-44.9, adult (HCC) 06/29/2023   Pain of both eyes 06/29/2023   Encounter for dental examination 06/29/2023   Screening for STD (sexually transmitted disease) 06/29/2023   Morbid obesity (HCC) 02/15/2023   Abdominal pain 10/20/2020   Vitamin D  deficiency 06/22/2019   Chronic nonintractable headache 03/12/2019   Edema 03/12/2019   Essential hypertension 02/12/2019    PCP: Georgina Speaks, FNP   REFERRING PROVIDER: Jule Ronal CROME, PA-C   REFERRING DIAG: [phone] (ICD-10-CM) - S/P left  knee arthroscopy  THERAPY DIAG:  Acute pain of left knee  Muscle weakness (generalized)  Localized edema  Other abnormalities of gait and mobility  Rationale for Evaluation and Treatment: Rehabilitation  ONSET DATE: Status post left knee arthroscopic lateral release with partial medial meniscectomy 02/08/2024  SUBJECTIVE:   SUBJECTIVE STATEMENT: She states she will start work tommorow  PERTINENT HISTORY: Knee post op  PAIN:  NPRS scale:  8/10 pain today Pain location: Left knee Pain description: constant, achy, can be sharp with flexion Aggravating factors: Too much WB and flexion AROM Relieving factors: rest, meds, ice   PRECAUTIONS: None  RED FLAGS: None   WEIGHT BEARING RESTRICTIONS: No  FALLS:  Has patient fallen in last 6 months? No  LIVING ENVIRONMENT Stairs: 3 stairs with handrail but handrail does not sound sturdy Has following equipment at home: Crutches  OCCUPATION: housekeeping  PLOF: Independent  PATIENT GOALS: reduce pain  NEXT MD VISIT: nothing scheduled  OBJECTIVE:  Note: Objective measures were completed at Evaluation unless otherwise noted.  PATIENT SURVEYS:  Patient-Specific Activity Scoring Scheme  0 represents "unable to perform." 10 represents "able to perform at prior level. 0 1 2 3 4 5 6 7 8 9  10 (Date and Score)   Activity Eval  02/16/2024    1. walking 3                       Score 3    Total score = sum of the activity scores/number of activities Minimum detectable change (90%CI) for average score = 2 points Minimum detectable change (90%CI) for single activity score = 3 points   EDEMA:  02/16/2024 Mild edema noted in left knee   LOWER EXTREMITY ROM:   ROM Right eval Left 02/16/2024 eval Left 02/21/24 Left 03/06/24  Hip flexion      Hip extension      Hip abduction      Hip adduction      Hip internal rotation      Hip external rotation      Knee flexion  80/90 Passive supine 90 AAROM 107   Knee extension  0 0 0  Ankle dorsiflexion      Ankle plantarflexion      Ankle inversion      Ankle eversion       (Blank rows = not tested)  LOWER EXTREMITY MMT:  MMT Right eval Left Eval 02/16/2024  Hip flexion  4  Hip extension    Hip abduction    Hip adduction    Hip internal rotation    Hip external rotation    Knee flexion  4  Knee extension  4  Ankle dorsiflexion    Ankle plantarflexion    Ankle inversion    Ankle eversion      (Blank rows = not tested)  FUNCTIONAL TESTS:  02/16/2024 Sit to stand: Must use hands to stand   GAIT: 02/16/2024 Comments: ambulates into clinic without AD, but limited knee flexion, wider BOS, and decreased gait speed noted.                  TODAY'S TREATMENT:      DATE: 03/06/24:  TherEx:  Nustep: level 4 x 8 minutes, LE/UE, seat #3 Supine heel slides AAROM 5 sec X 10 LAQ: 3 # 2 x 115  TherActivites:  Leg press double leg  x 20, 62 lbs , single leg x20 on Lt, 43  lbs  Sit to stand: x  15 c UE support  Modalities:  Vasopneumatic: declined today  TODAY'S TREATMENT:      DATE: 03/01/2024:   Therex: Nustep lvl 5 8 mins UE/LE  Incline gastroc stretch bilateral 30 sec x 3 Seated LAQ Lt leg 2.5 lbs 2 x 15 with end range pauses Supine AROM heel slide Lt leg 5 sec hold x 10    Neuro Re-ed (to improve balance, neuro muscular recruitment) Tandem stance 1 min x 1 bilateral in // bars on ground, 1 min x 1 bilateral on foam with very occasional HHA Feet together alternating heel/toe lift x 20 each way with occasional HHA on bar Supine quad set 5 sec hold x 10    TherActivity: (to improve transfers, stairs, squatting) Leg press double leg  x 15 62 lbs , single leg x15 performed bilaterally 43  lbs    Vaso 10 mins Lt knee 34 deg medium compression in elevation       PATIENT EDUCATION: Eval Education details: HEP, PT plan of care Person educated: Patient Education method: Explanation, Demonstration, Verbal cues, and Handouts Education comprehension: verbalized understanding and needs further education   HOME EXERCISE PROGRAM: Access Code: [email or web address] URL: [email or web address] Date: 02/16/2024 Prepared by: Redell Moose  Exercises - Supine Heel Slide with Strap  - 2 x daily - 6 x weekly - 1-2 sets - 10 reps - 5 sec hold - Supine Active Straight Leg Raise  - 2 x daily - 6 x weekly - 1-2 sets - 10 reps - 5 sec hold - Seated Long Arc Quad  - 2 x daily - 6 x  weekly - 1-2 sets - 10 reps - 3 sec hold - Standing Hip Abduction  - 2 x daily - 6 x weekly - 1 sets - 15 reps - seated knee flexion stretch with knee extension strengthening  - 2 x daily - 6 x weekly - 1-2 sets - 10 reps - 5 sec hold - Seated Straight Leg Raise with Quad Contraction  - 2 x daily - 6 x weekly - 1-2 sets - 10 reps  ASSESSMENT:  CLINICAL IMPRESSION:  Flexion ROM improved but still a little limited. 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 I did encourage her to call us  to schedule another appointment if she can find public transportation as we no longer offer transportation services.   OBJECTIVE IMPAIRMENTS: decreased activity tolerance, difficulty walking, decreased balance, decreased endurance, decreased mobility, decreased ROM, decreased strength, impaired flexibility, impaired UE/LE use, and pain.  ACTIVITY LIMITATIONS: bending, lifting, carry, locomotion, cleaning, community activity, driving, and or occupation  PERSONAL FACTORS: see PMH are also affecting patient's functional outcome.  REHAB POTENTIAL: Good  CLINICAL DECISION MAKING: Stable/uncomplicated  EVALUATION COMPLEXITY: Low    GOALS: Short term PT Goals Target date: 03/15/2024   Pt will be I and compliant with HEP. Baseline:  Goal status: MET 02/21/2024  Pt will decrease pain by 25% overall with walking Baseline: Goal status: on-going 02/21/24  Long term PT goals Target date:04/12/2024   Pt will improve knee AROM to WNL to improve functional mobility Baseline: Goal status: New Pt will improve  hip/knee strength to at least 5-/5 MMT to improve functional strength Baseline: Goal status: New Pt will improve PSFS to at least 7/10 functional to show improved function Baseline: Goal status: New Pt will reduce pain to overall less than 2-3/10 with usual activity and work activity. Baseline: Goal  status:  New  PLAN: PT FREQUENCY: 1-2 times per week   PT DURATION: 4-8 weeks  PLANNED INTERVENTIONS (unless contraindicated): aquatic PT, Canalith repositioning, cryotherapy, Electrical stimulation, Iontophoresis with 4 mg/ml dexamethasome, Moist heat, traction, Ultrasound, gait training, Therapeutic exercise, balance training, neuromuscular re-education, patient/family education, manual techniques, passive ROM, dry needling, taping, vasopnuematic device, vestibular, spinal manipulations, joint manipulations 97110-Therapeutic exercises, 97530- Therapeutic activity, 97112- Neuromuscular re-education, 97535- Self Care, and [id number]- Manual therapy  PLAN FOR NEXT SESSION: Continued mobility gains, strengthening.   Redell Moose, PT, DPT 03/06/24 3:49 PM

## 2024-06-25 ENCOUNTER — Encounter: Payer: BC Managed Care – PPO | Admitting: Nurse Practitioner

## 2024-09-05 ENCOUNTER — Other Ambulatory Visit: Payer: Self-pay | Admitting: Nurse Practitioner

## 2024-09-05 DIAGNOSIS — Z1231 Encounter for screening mammogram for malignant neoplasm of breast: Secondary | ICD-10-CM

## 2024-09-07 ENCOUNTER — Inpatient Hospital Stay: Admission: RE | Admit: 2024-09-07 | Payer: BC Managed Care – PPO | Source: Ambulatory Visit

## 2024-10-01 ENCOUNTER — Encounter: Payer: Self-pay | Admitting: Radiology
